# Patient Record
Sex: Female | Born: 1990
Health system: Southern US, Community
[De-identification: ages and names within clinical notes are randomized; demographics above are authoritative.]

## PROBLEM LIST (undated history)

## (undated) DIAGNOSIS — S83511A Sprain of anterior cruciate ligament of right knee, initial encounter: Secondary | ICD-10-CM

## (undated) DIAGNOSIS — Z8619 Personal history of other infectious and parasitic diseases: Secondary | ICD-10-CM

## (undated) DIAGNOSIS — R002 Palpitations: Secondary | ICD-10-CM

## (undated) HISTORY — PX: WISDOM TOOTH EXTRACTION: SHX21

## (undated) HISTORY — DX: Palpitations: R00.2

## (undated) HISTORY — DX: Personal history of other infectious and parasitic diseases: Z86.19

---
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
## 2012-09-04 ENCOUNTER — Encounter: Payer: Self-pay | Admitting: Family Medicine

## 2012-09-04 ENCOUNTER — Ambulatory Visit (INDEPENDENT_AMBULATORY_CARE_PROVIDER_SITE_OTHER): Payer: Federal, State, Local not specified - PPO | Admitting: Family Medicine

## 2012-09-04 VITALS — BP 102/62 | Temp 98.9°F | Wt 131.0 lb

## 2012-09-04 DIAGNOSIS — J310 Chronic rhinitis: Secondary | ICD-10-CM

## 2012-09-04 DIAGNOSIS — J329 Chronic sinusitis, unspecified: Secondary | ICD-10-CM

## 2012-09-04 MED ORDER — AZITHROMYCIN 250 MG PO TABS: ORAL_TABLET | ORAL | Status: DC

## 2012-09-04 NOTE — Progress Notes (Signed)
  Subjective:    Patient ID: Ariel Parker, female    DOB: 09/11/1990, 22 y.o.   MRN: [id number]  Sore Throat  This is a new problem. The current episode started in the past 7 days. The problem has been gradually worsening. Associated symptoms include headaches and swollen glands. Pertinent negatives include no coughing. She has had no exposure to strep. She has tried NSAIDs for the symptoms.    Patient also notes headache. Frontal in nature. Some congestion intermittently. Has been very busy so is feeling a bit tired.  Review of Systems  Respiratory: Negative for cough.   Neurological: Positive for headaches.       Objective:   Physical Exam  Alert vital signs stable. No acute distress. Lungs clear. Heart regular rate and rhythm. HEENT moderate nasal congestion frontal tenderness. Pharynx erythematous.      Assessment & Plan:  Impression #1 sinusitis with secondary pharyngitis-discussed. Plan symptomatic care discussed. Z-Pak use as directed. WSL

## 2012-09-04 NOTE — Patient Instructions (Addendum)
Inflammation in anterior glands is rarely mono, even less so in someone with prior hx. Will cover with z pk which will cover all the usual bacterial culprits.  Use three advil up to three times per day.

## 2012-09-08 ENCOUNTER — Telehealth: Payer: Self-pay | Admitting: Family Medicine

## 2012-09-08 MED ORDER — AZITHROMYCIN 250 MG PO TABS: ORAL_TABLET | ORAL | Status: DC

## 2012-09-08 NOTE — Telephone Encounter (Signed)
Patient notified. Sent in zpak to [facility] on [address] in [place], [place].

## 2012-09-08 NOTE — Telephone Encounter (Signed)
If it is just a day resume when she gets back, if several days then call in zpack

## 2012-09-08 NOTE — Telephone Encounter (Signed)
Pt states she forgot her zpak at home an went out of town, wants to know if she can just continue the dosage when she returns in a few days or do you need to call a new pack into the Pharmacy where she is now? [facility] [address].

## 2012-09-12 ENCOUNTER — Telehealth: Payer: Self-pay | Admitting: Family Medicine

## 2012-09-12 NOTE — Telephone Encounter (Signed)
Begins with OV to discuss Sx and examine. Testing usually ordered- tissuetransglutamase antibody.

## 2012-09-12 NOTE — Telephone Encounter (Signed)
Notified mom begins with OV to discuss Sx and examine. Testing usually ordered- tissuetransglutamase antibody. Mom transferred to front desk to schedule OV.

## 2012-09-12 NOTE — Telephone Encounter (Signed)
Patients mom is calling to see how to get her checked for celiac disease

## 2012-09-18 ENCOUNTER — Ambulatory Visit: Payer: Federal, State, Local not specified - PPO | Admitting: Family Medicine

## 2012-10-01 ENCOUNTER — Ambulatory Visit (INDEPENDENT_AMBULATORY_CARE_PROVIDER_SITE_OTHER): Payer: Federal, State, Local not specified - PPO | Admitting: Family Medicine

## 2012-10-01 ENCOUNTER — Encounter: Payer: Self-pay | Admitting: Family Medicine

## 2012-10-01 VITALS — BP 102/68 | Temp 98.7°F | Wt 127.0 lb

## 2012-10-01 DIAGNOSIS — R1013 Epigastric pain: Secondary | ICD-10-CM

## 2012-10-01 DIAGNOSIS — R634 Abnormal weight loss: Secondary | ICD-10-CM

## 2012-10-01 DIAGNOSIS — R5381 Other malaise: Secondary | ICD-10-CM

## 2012-10-01 DIAGNOSIS — R5383 Other fatigue: Secondary | ICD-10-CM

## 2012-10-01 NOTE — Progress Notes (Signed)
  Subjective:    Patient ID: Ariel Parker, female    DOB: 1990-11-04, 22 y.o.   MRN: [id number]  HPI Having abdominal pain after eating and oily stools. Having trouble since last November.  Long discussion held with patient regarding her symptoms. 30 minutes spent with family. No family history of bowel problems a supper irritable bowel. Patient does not smoke or drink. Patient relates severe pain and discomfort epigastric right upper quadrant after eating. Worse after milk related products. At times bowel movements are somewhat slimy-appearing but have a formed aspect to them. No blood in the bowel movements. No dysuria. Also seems to have significant abdominal pains and discomforts after her lactose and bread products patient has essentially become scared of eating to the point that she is losing weight. Denies night sweats or fevers. Has extreme nausea after eating.  Review of Systems Negative for chest pain shortness breath negative for swelling in the legs joint pains or rash. Negative for headaches congestion.    Objective:   Physical Exam Blood pressure good lungs are clear no crackle heart is regular abdomen is soft no guarding or rebound no masses are felt extremities no edema skin warm dry neurologic grossly normal       Assessment & Plan:  Abdominal pain-need to rule out various problems. We will go ahead with testing. Await the results. Ultrasound and lab work ordered. If these tests are negative next up would be gastroenterology in the next step after that would be EGD with biopsies of stomach and small intestine to rule out H. pylori and to rule out celiac disease. 30 minutes spent with patient.

## 2012-10-02 ENCOUNTER — Other Ambulatory Visit: Payer: Self-pay | Admitting: Family Medicine

## 2012-10-02 LAB — CBC WITH DIFFERENTIAL/PLATELET
Basophils Absolute: 0.1 10*3/uL (ref 0.0–0.1)
Eosinophils Absolute: 0.1 10*3/uL (ref 0.0–0.7)
Eosinophils Relative: 2 % (ref 0–5)
HCT: 36.1 % (ref 36.0–46.0)
MCH: 30.6 pg (ref 26.0–34.0)
MCHC: 34.6 g/dL (ref 30.0–36.0)
MCV: 88.3 fL (ref 78.0–100.0)
Monocytes Absolute: 0.4 10*3/uL (ref 0.1–1.0)
Platelets: 204 10*3/uL (ref 150–400)
RDW: 13 % (ref 11.5–15.5)

## 2012-10-03 LAB — BASIC METABOLIC PANEL
BUN: 24 mg/dL — ABNORMAL HIGH (ref 6–23)
CO2: 28 mEq/L (ref 19–32)
Chloride: 101 mEq/L (ref 96–112)
Glucose, Bld: 89 mg/dL (ref 70–99)
Potassium: 3.7 mEq/L (ref 3.5–5.3)
Sodium: 139 mEq/L (ref 135–145)

## 2012-10-03 LAB — EPSTEIN-BARR VIRUS VCA ANTIBODY PANEL
EBV EA IgG: <5 U/mL (ref <9.0)
EBV NA IgG: 319 U/mL — ABNORMAL HIGH (ref <18.0)

## 2012-10-03 LAB — LIPASE: Lipase: 34 U/L (ref 0–75)

## 2012-10-03 LAB — HEPATIC FUNCTION PANEL
AST: 21 U/L (ref 0–37)
Albumin: 4.6 g/dL (ref 3.5–5.2)
Alkaline Phosphatase: 43 U/L (ref 39–117)
Indirect Bilirubin: 0.3 mg/dL (ref 0.0–0.9)
Total Protein: 7.4 g/dL (ref 6.0–8.3)

## 2012-10-06 ENCOUNTER — Ambulatory Visit (HOSPITAL_COMMUNITY)
Admission: RE | Admit: 2012-10-06 | Discharge: 2012-10-06 | Disposition: A | Discharge status: Home / Self Care | Payer: Federal, State, Local not specified - PPO | Source: Ambulatory Visit | Attending: Family Medicine | Admitting: Family Medicine

## 2012-10-06 DIAGNOSIS — R1011 Right upper quadrant pain: Secondary | ICD-10-CM | POA: Insufficient documentation

## 2012-10-07 ENCOUNTER — Other Ambulatory Visit: Payer: Self-pay | Admitting: Family Medicine

## 2012-10-07 DIAGNOSIS — R109 Unspecified abdominal pain: Secondary | ICD-10-CM

## 2012-10-08 ENCOUNTER — Telehealth: Payer: Self-pay | Admitting: Family Medicine

## 2012-10-08 NOTE — Telephone Encounter (Signed)
Patient would like to see Dr. Karilyn Cota

## 2012-10-08 NOTE — Telephone Encounter (Signed)
Dr. Lorin Picket stated he put in order for referral. Pt wants to see Dr. Karilyn Cota

## 2012-10-08 NOTE — Progress Notes (Signed)
discusssed with patient 

## 2012-11-06 ENCOUNTER — Ambulatory Visit: Payer: Federal, State, Local not specified - PPO | Admitting: Adult Health

## 2013-04-16 ENCOUNTER — Ambulatory Visit (INDEPENDENT_AMBULATORY_CARE_PROVIDER_SITE_OTHER): Payer: Federal, State, Local not specified - PPO | Admitting: Nurse Practitioner

## 2013-04-16 ENCOUNTER — Encounter: Payer: Self-pay | Admitting: Nurse Practitioner

## 2013-04-16 VITALS — BP 118/70 | Temp 98.0°F | Ht 66.0 in | Wt 138.0 lb

## 2013-04-16 DIAGNOSIS — J069 Acute upper respiratory infection, unspecified: Secondary | ICD-10-CM

## 2013-04-16 MED ORDER — AZITHROMYCIN 250 MG PO TABS: ORAL_TABLET | ORAL | Status: DC

## 2013-04-16 MED ORDER — FLUTICASONE PROPIONATE 50 MCG/ACT NA SUSP: 2.0000 | Freq: Every day | NASAL | Status: DC

## 2013-04-20 ENCOUNTER — Encounter: Payer: Self-pay | Admitting: Nurse Practitioner

## 2013-04-20 NOTE — Progress Notes (Signed)
Subjective:  Presents for recheck after being diagnosed with acute tonsillitis 2 and half weeks ago at urgent care. Was given amoxicillin. Symptoms did improve but continues to have some symptoms. At this time, no fever. Continues to have a cough producing dark green sputum at times. Postnasal drainage. Slight wheezing only after prolonged coughing spells. Ear pain. Minimal sore throat. No headache. No vomiting diarrhea or abdominal pain. No acid reflux or heartburn.  Objective:   BP 118/70  Temp(Src) 98 F (36.7 C)  Ht 5\' 6"  (1.676 m)  Wt 138 lb (62.596 kg)  BMI 22.28 kg/m2 NAD. Alert, oriented. TMs clear effusion, no erythema. Nasal mucosa pale and moderately boggy. Pharynx right tonsil less than 1+, left tonsil 1-2+ with no erythema or exudate noted. Neck supple with mild soft anterior cervical adenopathy. Lungs clear. Heart regular rate rhythm. Abdomen soft nondistended nontender.  Assessment:Acute upper respiratory infections of unspecified site  Plan: Meds ordered this encounter  Medications  . azithromycin (ZITHROMAX Z-PAK) 250 MG tablet    Sig: Take 2 tablets (500 mg) on  Day 1,  followed by 1 tablet (250 mg) once daily on Days 2 through 5.    Dispense:  6 each    Refill:  0    Order Specific Question:  Supervising Provider    Answer:  Merlyn AlbertLUKING, WILLIAM S [[id number]]  . fluticasone (FLONASE) 50 MCG/ACT nasal spray    Sig: Place 2 sprays into both nostrils daily.    Dispense:  16 g    Refill:  5    Order Specific Question:  Supervising Provider    Answer:  Merlyn AlbertLUKING, WILLIAM S [[id number]]   OTC antihistamines as directed. Call back in 10-14 days if no improvement, sooner if worse.

## 2014-07-08 IMAGING — US US ABDOMEN COMPLETE
1 series · 13 of 25 positions shown · non-contrast
Comparison: None.

CLINICAL DATA: Right upper quadrant abdominal pain.

COMPLETE ABDOMINAL ULTRASOUND

[Series 1: us abdomen complete · 0.16mm/px · 13 of 104 slices shown]
[im 1/104]
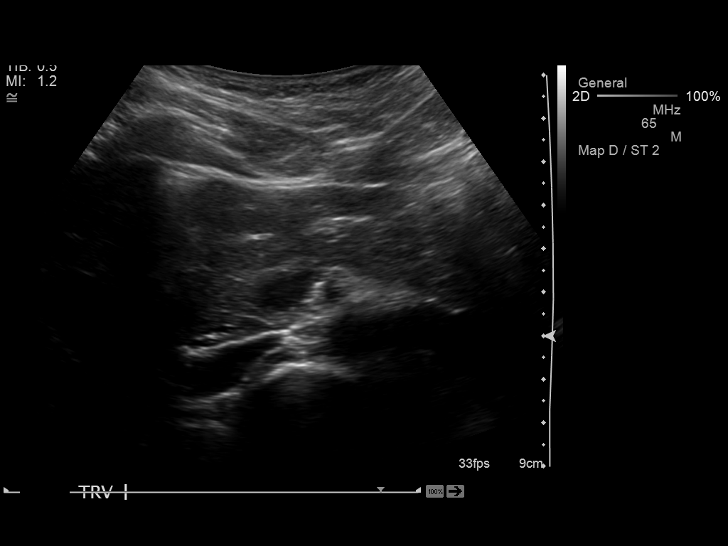
[im 9/104]
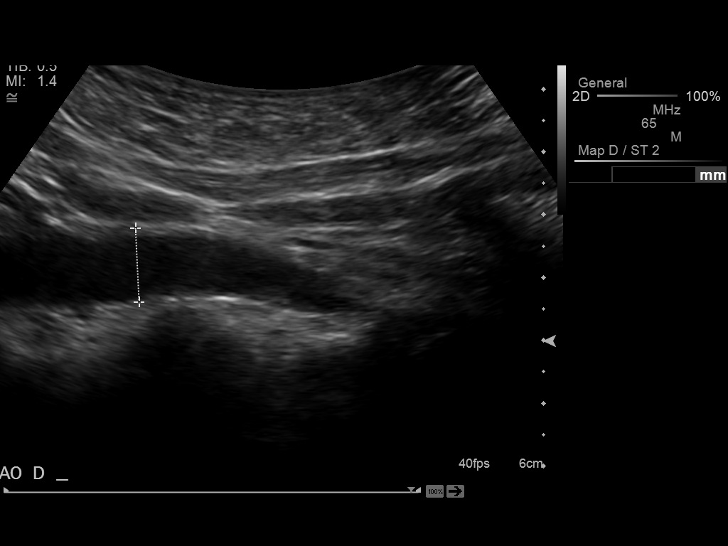
[im 18/104]
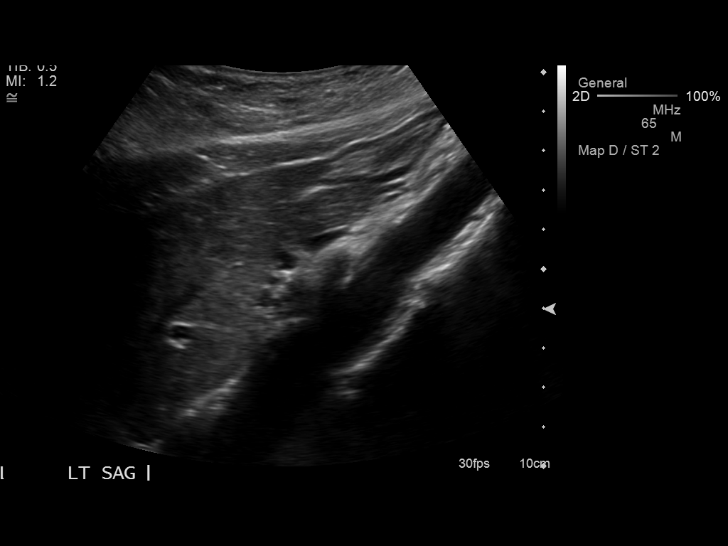
[im 26/104]
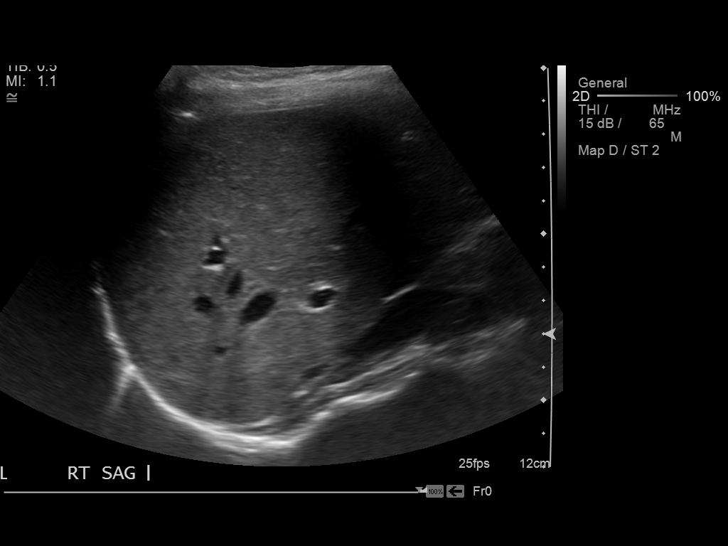
[im 35/104]
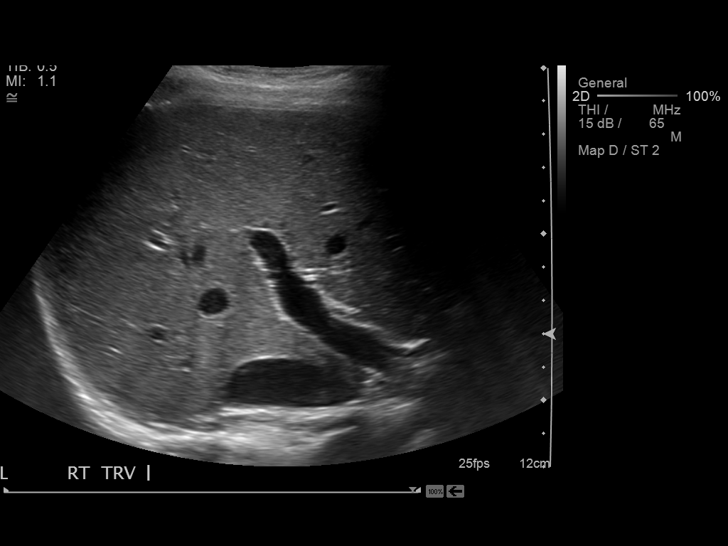
[im 43/104]
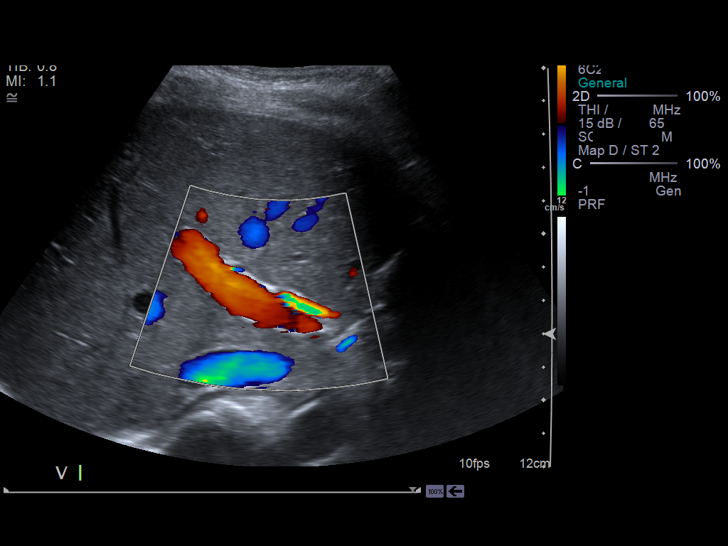
[im 52/104]
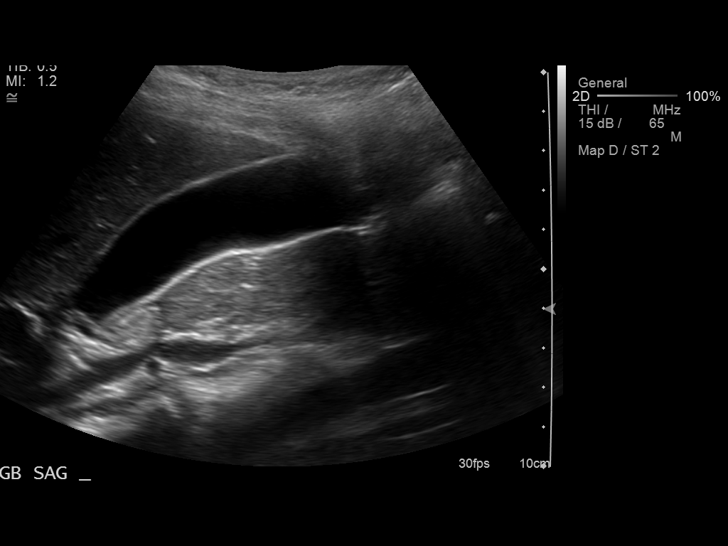
[im 61/104]
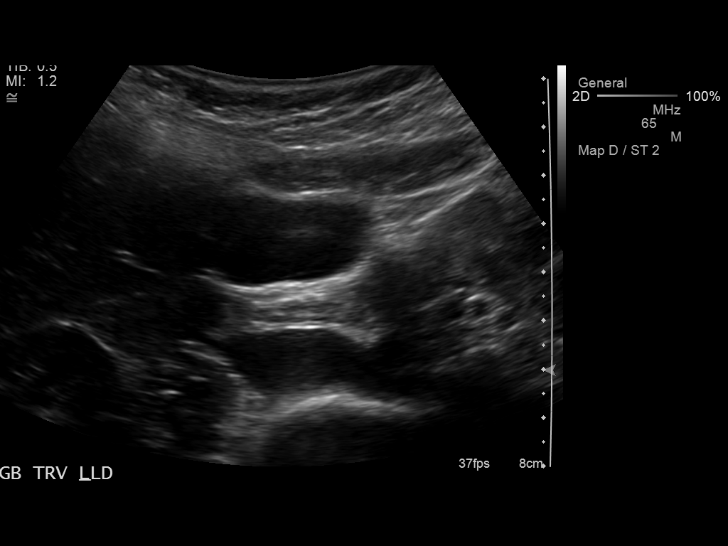
[im 69/104]
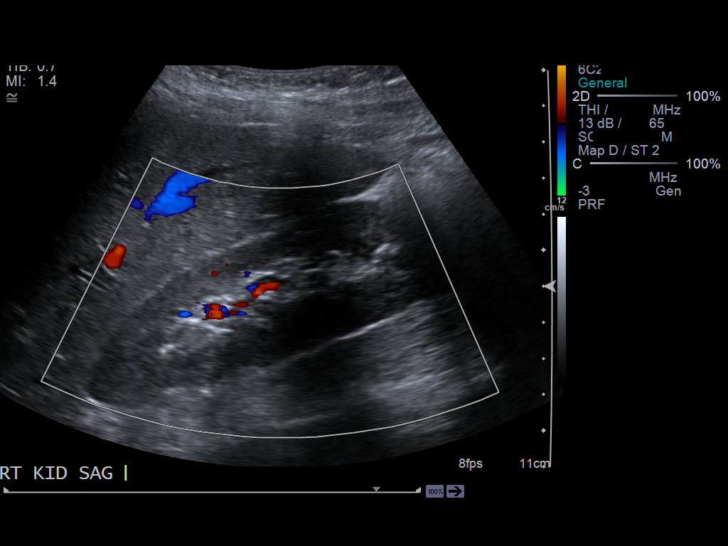
[im 78/104]
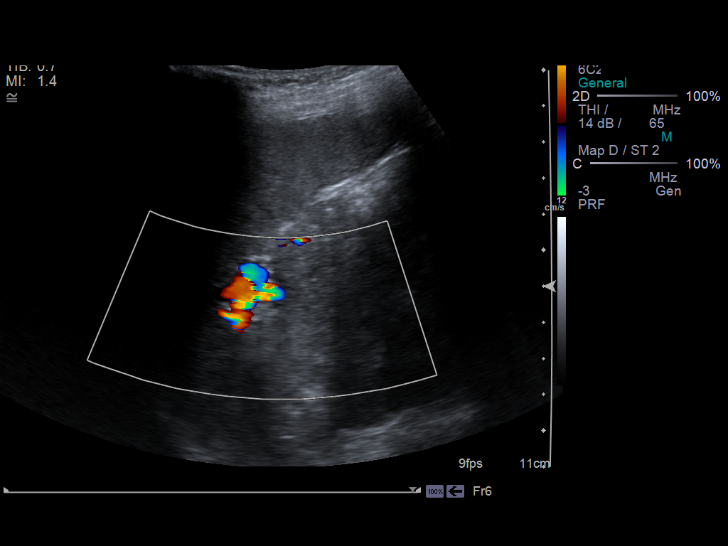
[im 86/104]
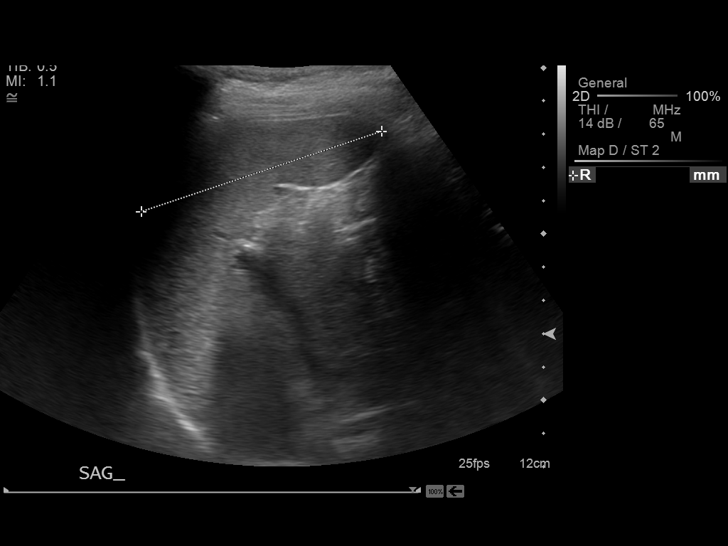
[im 95/104]
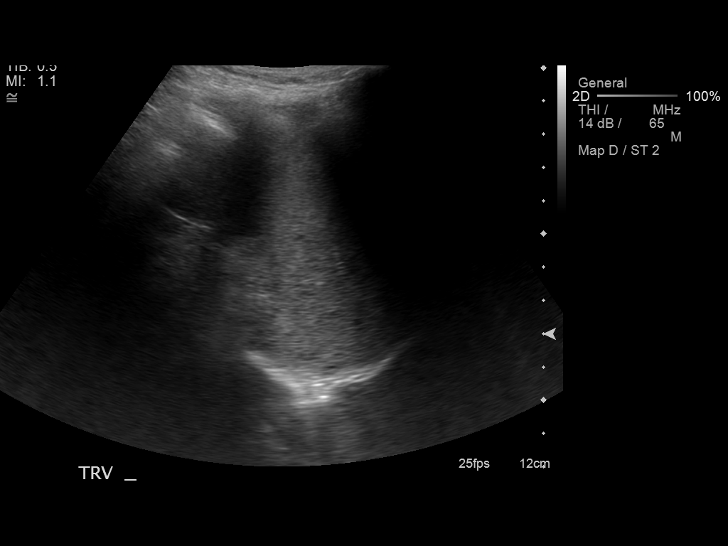
[im 104/104]
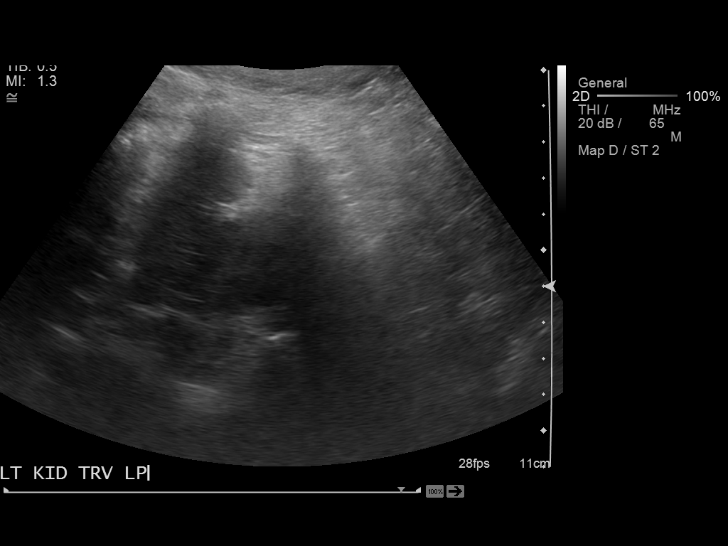

[13 of 25 positions shown; findings below may reference images not displayed]

FINDINGS: Gallbladder:  No shadowing gallstones or echogenic sludge.  No
gallbladder wall thickening or pericholecystic fluid.  No
sonographic Murphy's sign according to the ultrasound technologist.
Wall thickness is 1.4 mm, within normal limits.

Common bile duct:  Normal in caliber. No biliary ductal dilation.
The maximal diameter is 3.0 mm.

Liver:  No focal lesion identified.  Within normal limits in
parenchymal echogenicity.

IVC:  Appears normal.

Pancreas:  Although the pancreas is difficult to visualize in its
entirety, no focal pancreatic abnormality is identified.

Spleen:  Normal size and echotexture without focal parenchymal
abnormality.The maximal length is 7.6 cm, within normal limits.

Right Kidney:  No hydronephrosis.  Well-preserved cortex.  Normal
size and parenchymal echotexture without focal abnormalities. The
maximal length is 11.4 cm, within normal limits.

Left Kidney:  No hydronephrosis.  Well-preserved cortex.  Normal
size and parenchymal echotexture without focal abnormalities. The
maximal length is 11.6 cm, within normal limits.

Abdominal aorta:  No aneurysm identified.
IMPRESSION: Negative abdominal ultrasound.

## 2014-08-16 ENCOUNTER — Telehealth: Payer: Self-pay | Admitting: Family Medicine

## 2014-08-16 ENCOUNTER — Encounter: Payer: Self-pay | Admitting: Nurse Practitioner

## 2014-08-16 ENCOUNTER — Ambulatory Visit (INDEPENDENT_AMBULATORY_CARE_PROVIDER_SITE_OTHER): Payer: Federal, State, Local not specified - PPO | Admitting: Nurse Practitioner

## 2014-08-16 VITALS — BP 120/80 | Temp 98.9°F | Ht 66.0 in | Wt 141.0 lb

## 2014-08-16 DIAGNOSIS — B338 Other specified viral diseases: Secondary | ICD-10-CM | POA: Diagnosis not present

## 2014-08-16 DIAGNOSIS — B348 Other viral infections of unspecified site: Secondary | ICD-10-CM

## 2014-08-16 DIAGNOSIS — J069 Acute upper respiratory infection, unspecified: Secondary | ICD-10-CM

## 2014-08-16 DIAGNOSIS — H109 Unspecified conjunctivitis: Secondary | ICD-10-CM

## 2014-08-16 DIAGNOSIS — B9689 Other specified bacterial agents as the cause of diseases classified elsewhere: Secondary | ICD-10-CM

## 2014-08-16 MED ORDER — SULFACETAMIDE SODIUM 10 % OP SOLN: 1.0000 [drp] | OPHTHALMIC | Status: DC

## 2014-08-16 MED ORDER — AZITHROMYCIN 250 MG PO TABS: ORAL_TABLET | ORAL | Status: DC

## 2014-08-16 NOTE — Telephone Encounter (Signed)
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  She is in need of an MRI on her right knee and wants to know if we can order this for her?

## 2014-08-17 NOTE — Telephone Encounter (Signed)
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 This will help us decide what to do. Thanks.

## 2014-08-17 NOTE — Telephone Encounter (Signed)
LMRC

## 2014-08-19 ENCOUNTER — Encounter: Payer: Self-pay | Admitting: Nurse Practitioner

## 2014-08-19 NOTE — Progress Notes (Signed)
Subjective:  Presents complaints of fever sore throat and congestion that began 4 days ago. Low-grade fever less than 100. Headache. Cough producing clear mucus. No wheezing. Postnasal drainage when she lays down which causes difficulty breathing. Bloodshot eyes for the past few days with crusting in the morning. No itching or burning. No visual changes. No eye pain. No vomiting diarrhea or abdominal pain. Mild nausea. Taking fluids well. Voiding normal limit. Fatigue. Muscle aches.  Objective:   BP 120/80 mmHg  Temp(Src) 98.9 F (37.2 C) (Oral)  Ht 5\' 6"  (1.676 m)  Wt 141 lb (63.957 kg)  BMI 22.77 kg/m2 NAD. Alert, oriented. Fatigued in appearance. TMs clear effusion, no erythema. Conjunctiva mildly injected bilaterally with a preauricular lymph node noted on the left. Pharynx injected with PND noted. Neck supple with mild soft anterior adenopathy. Lungs clear. Heart regular rate rhythm.  Assessment: Parainfluenza  Bilateral conjunctivitis  Bacterial upper respiratory infection  Plan:  Meds ordered this encounter  Medications  . azithromycin (ZITHROMAX Z-PAK) 250 MG tablet    Sig: Take 2 tablets (500 mg) on  Day 1,  followed by 1 tablet (250 mg) once daily on Days 2 through 5.    Dispense:  6 each    Refill:  0    Order Specific Question:  Supervising Provider    Answer:  Merlyn AlbertLUKING, WILLIAM S [[id number]]  . sulfacetamide (BLEPH-10) 10 % ophthalmic solution    Sig: Place 1 drop into both eyes every 2 (two) hours while awake. Then QID starting tomorrow    Dispense:  10 mL    Refill:  0    Order Specific Question:  Supervising Provider    Answer:  Merlyn AlbertLUKING, WILLIAM S [[id number]]   OTC meds as directed for congestion and cough. Advised patient to conjunctivitis is most likely viral and will have to run its course, given Bleph 10 ophthalmic drops as a precaution. Call back by the end of the week if no improvement, sooner if worse.

## 2014-08-19 NOTE — Telephone Encounter (Signed)
LMRC

## 2014-08-20 NOTE — Telephone Encounter (Signed)
Left message to return call 

## 2014-08-20 NOTE — Telephone Encounter (Signed)
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

## 2014-08-21 NOTE — Telephone Encounter (Signed)
noted 

## 2014-08-25 ENCOUNTER — Ambulatory Visit (INDEPENDENT_AMBULATORY_CARE_PROVIDER_SITE_OTHER): Admitting: Family Medicine

## 2014-08-25 ENCOUNTER — Encounter: Payer: Self-pay | Admitting: Family Medicine

## 2014-08-25 VITALS — BP 108/64 | Ht 66.0 in | Wt 141.0 lb

## 2014-08-25 DIAGNOSIS — M25561 Pain in right knee: Secondary | ICD-10-CM

## 2014-08-25 NOTE — Progress Notes (Signed)
   Subjective:    Patient ID: Ariel Parker, female    DOB: 13-May-1990, 24 y.o.   MRN: [id number]  HPIRight knee injury about 4 weeks ago while playing soccer. This injury occurred during an active training exercise with the [employer].   [place] No xray was done. Still having pain and swelling. Not taking any meds for pain. Not able to bend it all the way and having trouble walking.   Motrin prn  Persistent pain and discomfort. Continues to experience a significant limp. Pain during daytime and nighttime.  States cannot completely extend leg.  History of ACL MCL injury left knee     Review of Systems No ankle pain no hip pain    Objective:   Physical Exam Alert vital stable lungs clear heart regular rhythm H&T normal right knee positive medial joint line tenderness positive effusion positive difficulty extension no obvious cruciate ligament laxity       Assessment & Plan:  Impression substantial right knee injury with ongoing locking question meniscal versus cartilage injury discussed plan MRI of knee. Rationale discussed WSL

## 2014-09-07 ENCOUNTER — Ambulatory Visit (HOSPITAL_COMMUNITY)
Admission: RE | Admit: 2014-09-07 | Discharge: 2014-09-07 | Disposition: A | Discharge status: Home / Self Care | Source: Ambulatory Visit | Attending: Family Medicine | Admitting: Family Medicine

## 2014-09-07 DIAGNOSIS — M25561 Pain in right knee: Secondary | ICD-10-CM | POA: Diagnosis present

## 2014-09-07 DIAGNOSIS — S83511A Sprain of anterior cruciate ligament of right knee, initial encounter: Secondary | ICD-10-CM | POA: Insufficient documentation

## 2014-09-07 DIAGNOSIS — X58XXXA Exposure to other specified factors, initial encounter: Secondary | ICD-10-CM | POA: Insufficient documentation

## 2014-09-24 DIAGNOSIS — S83511A Sprain of anterior cruciate ligament of right knee, initial encounter: Secondary | ICD-10-CM

## 2014-09-24 HISTORY — DX: Sprain of anterior cruciate ligament of right knee, initial encounter: S83.511A

## 2014-09-29 ENCOUNTER — Other Ambulatory Visit: Payer: Self-pay | Admitting: Physician Assistant

## 2014-10-05 ENCOUNTER — Other Ambulatory Visit: Payer: Self-pay | Admitting: Physician Assistant

## 2014-10-05 NOTE — H&P (Signed)
Ariel Parker is an old patient of mine I haven't seen in a number of years.  I did a left knee ACL reconstruction, hamstring autograft back in 2008.  Full recovery and rehab.  Back to full activity.  She has done well.  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  Obviously to get back to release and full duty this needs to be addressed first.  Typical injury, vertical load, twisting.  She felt and heard a pop.  Initial swelling improved.  Persistent instability since.  Subsequent evaluation and workup, including MRI scan, complete.  Complete anterior cruciate ligament tear with typical bone bruises.  Other ligaments and menisci are intact.  No degenerative changes.  She comes in to discuss definitive treatment.  She is obviously well aware of what is involved, as I did her left knee back in 2008.  She is seen today with her mom.  She does want to get back to full duty and pursue a career in the [employer].   History and general exam is outlined and included in the chart.   EXAMINATION: Specifically, both knees have full motion.  Swelling definitely better on the right.  On the left she has a good end point with Lachman and drawer.  Ligaments stable.  No meniscal signs.  On the right she has a positive Lachman, drawer and pivot shift.  A little sore laterally, but I am not getting a true McMurray's.  Neurovascularly intact distally.    X-RAYS: Four view standing x-ray shows no acute fractures.  No degenerative changes in either knee.  She does have a post screw in the proximal tibia on the left.  There are no symptoms from this.    DISPOSITION:  Right knee anterior cruciate ligament tear, anterolateral rotary instability.  We need to pursue reconstruction based on age, activity and vocational pursuits.  She understands and agrees.  Exam under anesthesia, arthroscopy, arthroscopic endoscopic reconstruction  utilizing hamstring autograft.  Procedure, risks, benefits and complications reviewed.  Paperwork complete.  All questions answered.  Depending on findings and how she does with rehab, she is going to be six months to a year before she is released without any restrictions.  I can obviously release her earlier for limited activities once she is ambulatory and mobile.  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  I will see her at the time of operative intervention.    Loreta Aveaniel F. Murphy, M.D.

## 2014-10-08 ENCOUNTER — Encounter (HOSPITAL_BASED_OUTPATIENT_CLINIC_OR_DEPARTMENT_OTHER): Payer: Self-pay | Admitting: *Deleted

## 2014-10-14 ENCOUNTER — Ambulatory Visit (HOSPITAL_BASED_OUTPATIENT_CLINIC_OR_DEPARTMENT_OTHER)
Admission: RE | Admit: 2014-10-14 | Discharge: 2014-10-14 | Disposition: A | Discharge status: Home / Self Care | Source: Ambulatory Visit | Attending: Orthopedic Surgery | Admitting: Orthopedic Surgery

## 2014-10-14 ENCOUNTER — Encounter (HOSPITAL_BASED_OUTPATIENT_CLINIC_OR_DEPARTMENT_OTHER): Payer: Self-pay | Admitting: Anesthesiology

## 2014-10-14 ENCOUNTER — Ambulatory Visit (HOSPITAL_BASED_OUTPATIENT_CLINIC_OR_DEPARTMENT_OTHER): Admitting: Anesthesiology

## 2014-10-14 ENCOUNTER — Encounter (HOSPITAL_BASED_OUTPATIENT_CLINIC_OR_DEPARTMENT_OTHER)
Admission: RE | Disposition: A | Discharge status: Home / Self Care | Payer: Self-pay | Source: Ambulatory Visit | Attending: Orthopedic Surgery

## 2014-10-14 DIAGNOSIS — S83511A Sprain of anterior cruciate ligament of right knee, initial encounter: Secondary | ICD-10-CM | POA: Insufficient documentation

## 2014-10-14 DIAGNOSIS — M25361 Other instability, right knee: Secondary | ICD-10-CM | POA: Diagnosis not present

## 2014-10-14 DIAGNOSIS — Y9366 Activity, soccer: Secondary | ICD-10-CM | POA: Diagnosis not present

## 2014-10-14 DIAGNOSIS — X58XXXA Exposure to other specified factors, initial encounter: Secondary | ICD-10-CM | POA: Diagnosis not present

## 2014-10-14 DIAGNOSIS — Z888 Allergy status to other drugs, medicaments and biological substances status: Secondary | ICD-10-CM | POA: Insufficient documentation

## 2014-10-14 HISTORY — PX: KNEE ARTHROSCOPY WITH LATERAL MENISECTOMY: SHX6193

## 2014-10-14 HISTORY — PX: KNEE ARTHROSCOPY WITH ANTERIOR CRUCIATE LIGAMENT (ACL) REPAIR WITH HAMSTRING GRAFT: SHX5645

## 2014-10-14 HISTORY — DX: Sprain of anterior cruciate ligament of right knee, initial encounter: S83.511A

## 2014-10-14 SURGERY — KNEE ARTHROSCOPY WITH ANTERIOR CRUCIATE LIGAMENT (ACL) REPAIR WITH HAMSTRING GRAFT
Anesthesia: General | Site: Knee | Laterality: Right

## 2014-10-14 MED ORDER — FENTANYL CITRATE (PF) 100 MCG/2ML IJ SOLN
INTRAMUSCULAR | Status: DC | PRN
  Administered 2014-10-14 (×2): 50 ug via INTRAVENOUS

## 2014-10-14 MED ORDER — LACTATED RINGERS IV SOLN: INTRAVENOUS | Status: DC

## 2014-10-14 MED ORDER — FENTANYL CITRATE (PF) 100 MCG/2ML IJ SOLN
INTRAMUSCULAR | Status: AC
  Filled 2014-10-14: qty 2

## 2014-10-14 MED ORDER — CEFAZOLIN SODIUM-DEXTROSE 2-3 GM-% IV SOLR
2.0000 g | INTRAVENOUS | Status: AC
  Administered 2014-10-14: 2 g via INTRAVENOUS

## 2014-10-14 MED ORDER — KETOROLAC TROMETHAMINE 30 MG/ML IJ SOLN
INTRAMUSCULAR | Status: DC | PRN
  Administered 2014-10-14: 30 mg via INTRAVENOUS

## 2014-10-14 MED ORDER — MIDAZOLAM HCL 2 MG/2ML IJ SOLN
INTRAMUSCULAR | Status: AC
  Filled 2014-10-14: qty 2

## 2014-10-14 MED ORDER — ROPIVACAINE HCL 5 MG/ML IJ SOLN
INTRAMUSCULAR | Status: DC | PRN
  Administered 2014-10-14: 20 mL via PERINEURAL

## 2014-10-14 MED ORDER — DEXAMETHASONE SODIUM PHOSPHATE 4 MG/ML IJ SOLN
INTRAMUSCULAR | Status: DC | PRN
  Administered 2014-10-14: 10 mg via INTRAVENOUS

## 2014-10-14 MED ORDER — PROPOFOL 10 MG/ML IV BOLUS
INTRAVENOUS | Status: DC | PRN
  Administered 2014-10-14: 30 mg via INTRAVENOUS
  Administered 2014-10-14: 200 mg via INTRAVENOUS

## 2014-10-14 MED ORDER — ONDANSETRON HCL 4 MG PO TABS: 4.0000 mg | ORAL_TABLET | Freq: Three times a day (TID) | ORAL | Status: DC | PRN

## 2014-10-14 MED ORDER — SCOPOLAMINE 1 MG/3DAYS TD PT72
MEDICATED_PATCH | TRANSDERMAL | Status: AC
  Filled 2014-10-14: qty 1

## 2014-10-14 MED ORDER — LIDOCAINE HCL (CARDIAC) 20 MG/ML IV SOLN
INTRAVENOUS | Status: DC | PRN
  Administered 2014-10-14: 50 mg via INTRAVENOUS

## 2014-10-14 MED ORDER — GLYCOPYRROLATE 0.2 MG/ML IJ SOLN: 0.2000 mg | Freq: Once | INTRAMUSCULAR | Status: DC | PRN

## 2014-10-14 MED ORDER — MIDAZOLAM HCL 2 MG/2ML IJ SOLN
1.0000 mg | INTRAMUSCULAR | Status: DC | PRN
  Administered 2014-10-14: 1 mg via INTRAVENOUS

## 2014-10-14 MED ORDER — LIDOCAINE-EPINEPHRINE (PF) 1.5 %-1:200000 IJ SOLN
INTRAMUSCULAR | Status: DC | PRN
  Administered 2014-10-14: 10 mL via PERINEURAL

## 2014-10-14 MED ORDER — KETOROLAC TROMETHAMINE 30 MG/ML IJ SOLN: 30.0000 mg | Freq: Once | INTRAMUSCULAR | Status: DC | PRN

## 2014-10-14 MED ORDER — LACTATED RINGERS IV SOLN
INTRAVENOUS | Status: DC
  Administered 2014-10-14 (×2): via INTRAVENOUS

## 2014-10-14 MED ORDER — MIDAZOLAM HCL 5 MG/5ML IJ SOLN
INTRAMUSCULAR | Status: DC | PRN
Start: 2014-10-14 — End: 2014-10-14
  Administered 2014-10-14: 2 mg via INTRAVENOUS

## 2014-10-14 MED ORDER — SCOPOLAMINE 1 MG/3DAYS TD PT72
1.0000 | MEDICATED_PATCH | Freq: Once | TRANSDERMAL | Status: DC | PRN
  Administered 2014-10-14: 1.5 mg via TRANSDERMAL

## 2014-10-14 MED ORDER — PROMETHAZINE HCL 25 MG/ML IJ SOLN
6.2500 mg | INTRAMUSCULAR | Status: DC | PRN
Start: 2014-10-14 — End: 2014-10-14

## 2014-10-14 MED ORDER — FENTANYL CITRATE (PF) 100 MCG/2ML IJ SOLN
INTRAMUSCULAR | Status: AC
  Filled 2014-10-14: qty 6

## 2014-10-14 MED ORDER — HYDROMORPHONE HCL 1 MG/ML IJ SOLN
0.2500 mg | INTRAMUSCULAR | Status: DC | PRN
  Administered 2014-10-14 (×2): 0.5 mg via INTRAVENOUS

## 2014-10-14 MED ORDER — HYDROMORPHONE HCL 1 MG/ML IJ SOLN
INTRAMUSCULAR | Status: AC
  Filled 2014-10-14: qty 1

## 2014-10-14 MED ORDER — ONDANSETRON 8 MG PO TBDP
8.0000 mg | ORAL_TABLET | Freq: Once | ORAL | Status: AC
Start: 2014-10-14 — End: 2014-10-14
  Administered 2014-10-14: 8 mg via ORAL

## 2014-10-14 MED ORDER — FENTANYL CITRATE (PF) 100 MCG/2ML IJ SOLN
50.0000 ug | INTRAMUSCULAR | Status: DC | PRN
  Administered 2014-10-14: 50 ug via INTRAVENOUS

## 2014-10-14 MED ORDER — HYDROCODONE-ACETAMINOPHEN 10-325 MG PO TABS: 1.0000 | ORAL_TABLET | ORAL | Status: DC | PRN

## 2014-10-14 MED ORDER — CHLORHEXIDINE GLUCONATE 4 % EX LIQD: 60.0000 mL | Freq: Once | CUTANEOUS | Status: DC

## 2014-10-14 MED ORDER — ONDANSETRON 8 MG PO TBDP
ORAL_TABLET | ORAL | Status: AC
  Filled 2014-10-14: qty 1

## 2014-10-14 SURGICAL SUPPLY — 78 items
ANCHOR BUTTON TIGHTROPE ACL RT (Orthopedic Implant) ×2 IMPLANT
ANCHOR BUTTON TIGHTROPE RN 14 (Anchor) ×2 IMPLANT
ANCHOR PUSHLOCK PEEK 3.5X19.5 (Anchor) ×2 IMPLANT
APL SKNCLS STERI-STRIP NONHPOA (GAUZE/BANDAGES/DRESSINGS) ×1
BANDAGE ELASTIC 6 VELCRO ST LF (GAUZE/BANDAGES/DRESSINGS) ×2 IMPLANT
BANDAGE ESMARK 6X9 LF (GAUZE/BANDAGES/DRESSINGS) ×1 IMPLANT
BENZOIN TINCTURE PRP APPL 2/3 (GAUZE/BANDAGES/DRESSINGS) ×2 IMPLANT
BLADE 4.2CUDA (BLADE) IMPLANT
BLADE CUDA 5.5 (BLADE) IMPLANT
BLADE CUDA GRT WHITE 3.5 (BLADE) IMPLANT
BLADE CUTTER GATOR 3.5 (BLADE) ×2 IMPLANT
BLADE CUTTER MENIS 5.5 (BLADE) IMPLANT
BLADE GREAT WHITE 4.2 (BLADE) ×2 IMPLANT
BLADE SURG 15 STRL LF DISP TIS (BLADE) ×1 IMPLANT
BLADE SURG 15 STRL SS (BLADE) ×1
BNDG CMPR 9X6 STRL LF SNTH (GAUZE/BANDAGES/DRESSINGS) ×1
BNDG ESMARK 6X9 LF (GAUZE/BANDAGES/DRESSINGS) ×2
BUR OVAL 6.0 (BURR) ×2 IMPLANT
COVER BACK TABLE 60X90IN (DRAPES) ×2 IMPLANT
CUFF TOURNIQUET SINGLE 34IN LL (TOURNIQUET CUFF) ×2 IMPLANT
CUTTER MENISCUS  4.2MM (BLADE)
CUTTER MENISCUS 4.2MM (BLADE) IMPLANT
DECANTER SPIKE VIAL GLASS SM (MISCELLANEOUS) IMPLANT
DRAPE ARTHROSCOPY W/POUCH 114 (DRAPES) ×2 IMPLANT
DRAPE OEC MINIVIEW 54X84 (DRAPES) ×2 IMPLANT
DRAPE U-SHAPE 47X51 STRL (DRAPES) ×2 IMPLANT
DURAPREP 26ML APPLICATOR (WOUND CARE) ×2 IMPLANT
ELECT MENISCUS 165MM 90D (ELECTRODE) ×2 IMPLANT
ELECT REM PT RETURN 9FT ADLT (ELECTROSURGICAL) ×2
ELECTRODE REM PT RTRN 9FT ADLT (ELECTROSURGICAL) ×1 IMPLANT
GAUZE SPONGE 4X4 12PLY STRL (GAUZE/BANDAGES/DRESSINGS) ×4 IMPLANT
GAUZE XEROFORM 1X8 LF (GAUZE/BANDAGES/DRESSINGS) ×2 IMPLANT
GLOVE BIO SURGEON STRL SZ 6.5 (GLOVE) ×4 IMPLANT
GLOVE BIOGEL PI IND STRL 7.0 (GLOVE) ×2 IMPLANT
GLOVE BIOGEL PI INDICATOR 7.0 (GLOVE) ×2
GLOVE ECLIPSE 7.0 STRL STRAW (GLOVE) ×2 IMPLANT
GLOVE SURG ORTHO 8.0 STRL STRW (GLOVE) ×2 IMPLANT
GOWN STRL REUS W/ TWL LRG LVL3 (GOWN DISPOSABLE) ×2 IMPLANT
GOWN STRL REUS W/ TWL XL LVL3 (GOWN DISPOSABLE) ×1 IMPLANT
GOWN STRL REUS W/TWL LRG LVL3 (GOWN DISPOSABLE) ×2
GOWN STRL REUS W/TWL XL LVL3 (GOWN DISPOSABLE) ×1
IMMOBILIZER KNEE 22 UNIV (SOFTGOODS) ×2 IMPLANT
IMMOBILIZER KNEE 24 THIGH 36 (MISCELLANEOUS) IMPLANT
IMMOBILIZER KNEE 24 UNIV (MISCELLANEOUS)
IV NS IRRIG 3000ML ARTHROMATIC (IV SOLUTION) ×8 IMPLANT
KNEE WRAP E Z 3 GEL PACK (MISCELLANEOUS) ×2 IMPLANT
MANIFOLD NEPTUNE II (INSTRUMENTS) ×2 IMPLANT
NS IRRIG 1000ML POUR BTL (IV SOLUTION) ×2 IMPLANT
PACK ARTHROSCOPY DSU (CUSTOM PROCEDURE TRAY) ×2 IMPLANT
PACK BASIN DAY SURGERY FS (CUSTOM PROCEDURE TRAY) ×2 IMPLANT
PASSER SUT SWANSON 36MM LOOP (INSTRUMENTS) ×2 IMPLANT
PENCIL BUTTON HOLSTER BLD 10FT (ELECTRODE) ×2 IMPLANT
PIN DRILL ACL TIGHTROPE 4MM (PIN) ×2 IMPLANT
PK GRAFTLINK AUTO IMPLANT SYST (Anchor) ×2 IMPLANT
SET ARTHROSCOPY TUBING (MISCELLANEOUS) ×2
SET ARTHROSCOPY TUBING LN (MISCELLANEOUS) ×1 IMPLANT
SLEEVE SCD COMPRESS KNEE MED (MISCELLANEOUS) IMPLANT
SPONGE LAP 4X18 X RAY DECT (DISPOSABLE) ×2 IMPLANT
STRIP CLOSURE SKIN 1/2X4 (GAUZE/BANDAGES/DRESSINGS) ×2 IMPLANT
SUCTION FRAZIER TIP 10 FR DISP (SUCTIONS) IMPLANT
SUT 2 FIBERLOOP 20 STRT BLUE (SUTURE) ×2
SUT ETHILON 3 0 PS 1 (SUTURE) ×2 IMPLANT
SUT FIBERWIRE #2 38 T-5 BLUE (SUTURE) ×4
SUT MNCRL AB 3-0 PS2 18 (SUTURE) IMPLANT
SUT VIC AB 1 CT1 27 (SUTURE) ×4
SUT VIC AB 1 CT1 27XBRD ANBCTR (SUTURE) ×2 IMPLANT
SUT VIC AB 2-0 SH 27 (SUTURE) ×2
SUT VIC AB 2-0 SH 27XBRD (SUTURE) ×1 IMPLANT
SUT VIC AB 3-0 SH 27 (SUTURE)
SUT VIC AB 3-0 SH 27X BRD (SUTURE) IMPLANT
SUT VICRYL 4-0 PS2 18IN ABS (SUTURE) IMPLANT
SUTURE 2 FIBERLOOP 20 STRT BLU (SUTURE) ×1 IMPLANT
SUTURE FIBERWR #2 38 T-5 BLUE (SUTURE) ×2 IMPLANT
SYSTEM GRAFT IMPLANT AUTOGRAFT (Anchor) ×1 IMPLANT
TOWEL OR 17X24 6PK STRL BLUE (TOWEL DISPOSABLE) ×4 IMPLANT
TOWEL OR NON WOVEN STRL DISP B (DISPOSABLE) ×2 IMPLANT
WATER STERILE IRR 1000ML POUR (IV SOLUTION) ×2 IMPLANT
YANKAUER SUCT BULB TIP NO VENT (SUCTIONS) IMPLANT

## 2014-10-14 NOTE — Anesthesia Postprocedure Evaluation (Signed)
  Anesthesia Post-op Note  Patient: Archivist  Procedure(s) Performed: Procedure(s) (LRB): RIGHT KNEE ARTHROSCOPY WITH ANTERIOR CRUCIATE LIGAMENT (ACL) REPAIR (Right) KNEE ARTHROSCOPY WITH LATERAL MENISECTOMY (Right)  Patient Location: PACU  Anesthesia Type: GA combined with regional for post-op pain  Level of Consciousness: awake and alert   Airway and Oxygen Therapy: Patient Spontanous Breathing  Post-op Pain: mild  Post-op Assessment: Post-op Vital signs reviewed, Patient's Cardiovascular Status Stable, Respiratory Function Stable, Patent Airway and No signs of Nausea or vomiting  Last Vitals:  Filed Vitals:   10/14/14 1430  BP: 123/76  Pulse: 69  Temp:   Resp: 16    Post-op Vital Signs: stable   Complications: No apparent anesthesia complications

## 2014-10-14 NOTE — H&P (View-Only) (Signed)
Ariel Parker is an old patient of mine I haven't seen in a number of years.  I did a left knee ACL reconstruction, hamstring autograft back in 2008.  Full recovery and rehab.  Back to full activity.  She has done well.  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  Obviously to get back to release and full duty this needs to be addressed first.  Typical injury, vertical load, twisting.  She felt and heard a pop.  Initial swelling improved.  Persistent instability since.  Subsequent evaluation and workup, including MRI scan, complete.  Complete anterior cruciate ligament tear with typical bone bruises.  Other ligaments and menisci are intact.  No degenerative changes.  She comes in to discuss definitive treatment.  She is obviously well aware of what is involved, as I did her left knee back in 2008.  She is seen today with her mom.  She does want to get back to full duty and pursue a career in the military.   History and general exam is outlined and included in the chart.   EXAMINATION: Specifically, both knees have full motion.  Swelling definitely better on the right.  On the left she has a good end point with Lachman and drawer.  Ligaments stable.  No meniscal signs.  On the right she has a positive Lachman, drawer and pivot shift.  A little sore laterally, but I am not getting a true McMurray's.  Neurovascularly intact distally.    X-RAYS: Four view standing x-ray shows no acute fractures.  No degenerative changes in either knee.  She does have a post screw in the proximal tibia on the left.  There are no symptoms from this.    DISPOSITION:  Right knee anterior cruciate ligament tear, anterolateral rotary instability.  We need to pursue reconstruction based on age, activity and vocational pursuits.  She understands and agrees.  Exam under anesthesia, arthroscopy, arthroscopic endoscopic reconstruction  utilizing hamstring autograft.  Procedure, risks, benefits and complications reviewed.  Paperwork complete.  All questions answered.  Depending on findings and how she does with rehab, she is going to be six months to a year before she is released without any restrictions.  I can obviously release her earlier for limited activities once she is ambulatory and mobile.  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  I will see her at the time of operative intervention.    Daniel F. Murphy, M.D.  

## 2014-10-14 NOTE — Discharge Instructions (Signed)
[facility] [address]   [address] [place], [place] [address] [phone] A Division of [facility] Loreta Ave, M.D.     Robert A. Thurston Hole, M.D.     Lunette Stands, M.D. Eulas Post, M.D.    Buford Dresser, M.D. Estell Harpin, M.D. Ralene Cork, D.O.          Avis Epley, PA-C            Kirstin A. Shepperson, PA-C Janace Litten, OPA-C  ARTHROSCOPIC SURGERY POSTOPERATIVE INSTRUCTIONS - KNEE/ACL  You have just had and arthroscopic operation. Even though your incisions (puncture sites) are small and should heal quickly the structures inside your knee may take 6-8 weeks to heal and settle down.  This healing time is variable for each patient and will depend on what was done inside the knee at the time of surgery. Non-weight bearing for the first week.  Must be in knee immobilizer at all times unless in cpm.  Follow up appointment in one week.  PAIN MEDICATION You will be given a prescription for pain medication. Please take this medication as written. Most patients will require medication only for a few days.  SWELLING You can expect some swelling in your knee, this is normal. Applying EZY-GEL Wrap (cold packs) and elevating your leg will help keep the swelling to a minimum. Your entire leg should be elevated, not just your knee. Elevate your leg to or above the level of your waist.  The cold packs should be used constantly during the first 48 hours along with continued elevation of your leg.  After that ice for at least 1 hour, 4 times per day.  DRESSING Fluid leakage is common the first few days.  Precautions to prevent staining of your clothes, bed sheets, etc. should be taken.  The fluid was used to inflate the joint during surgery and is commonly tinged red from the small amount of blood. Some bleeding or leakage from the puncture sites may occur for a few days. Do not change your dressing. Do not  remove the Steri-Strips. Do not shower or get the wound wet.  SYMPTOMS TO REPORT TO YOUR DOCTOR Extreme pain. Extreme swelling. Temperature above 101 degrees. Change in the feeling, color or movement in your toes. Redness, heat or swelling at your puncture sites.  OFFICE CHECK-UP If no major problems arise and you are progressing well we will need to see you in one week. Please call the office to make an appointment. We will remove your sutures and discuss our surgery and rehabilitation at that time.  Regional Anesthesia Blocks  1. Numbness or the inability to move the "blocked" extremity may last from 3-48 hours after placement. The length of time depends on the medication injected and your individual response to the medication. If the numbness is not going away after 48 hours, call your surgeon.  2. The extremity that is blocked will need to be protected until the numbness is gone and the  Strength has returned. Because you cannot feel it, you will need to take extra care to avoid injury. Because it may be weak, you may have difficulty moving it or using it. You may not know what position it is in without looking at it while the block is in effect.  3. For blocks in the legs and feet, returning to weight bearing and walking needs to be done carefully. You will need to wait until the numbness  is entirely gone and the strength has returned. You should be able to move your leg and foot normally before you try and bear weight or walk. You will need someone to be with you when you first try to ensure you do not fall and possibly risk injury.  4. Bruising and tenderness at the needle site are common side effects and will resolve in a few days.  5. Persistent numbness or new problems with movement should be communicated to the surgeon or the [facility] [phone] [facility] [phone]).  Post Anesthesia Home Care Instructions  Activity: Get plenty of rest for the  remainder of the day. A responsible adult should stay with you for 24 hours following the procedure.  For the next 24 hours, DO NOT: -Drive a car -Advertising copywriter -Drink alcoholic beverages -Take any medication unless instructed by your physician -Make any legal decisions or sign important papers.  Meals: Start with liquid foods such as gelatin or soup. Progress to regular foods as tolerated. Avoid greasy, spicy, heavy foods. If nausea and/or vomiting occur, drink only clear liquids until the nausea and/or vomiting subsides. Call your physician if vomiting continues.  Special Instructions/Symptoms: Your throat may feel dry or sore from the anesthesia or the breathing tube placed in your throat during surgery. If this causes discomfort, gargle with warm salt water. The discomfort should disappear within 24 hours.  If you had a scopolamine patch placed behind your ear for the management of post- operative nausea and/or vomiting:  1. The medication in the patch is effective for 72 hours, after which it should be removed.  Wrap patch in a tissue and discard in the trash. Wash hands thoroughly with soap and water. 2. You may remove the patch earlier than 72 hours if you experience unpleasant side effects which may include dry mouth, dizziness or visual disturbances. 3. Avoid touching the patch. Wash your hands with soap and water after contact with the patch.

## 2014-10-14 NOTE — Anesthesia Preprocedure Evaluation (Signed)
Anesthesia Evaluation  Patient identified by MRN, date of birth, ID band Patient awake    Reviewed: Allergy & Precautions, NPO status , Patient's Chart, lab work & pertinent test results  Airway Mallampati: II  TM Distance: >3 FB Neck ROM: Full    Dental no notable dental hx.    Pulmonary neg pulmonary ROS,  breath sounds clear to auscultation  Pulmonary exam normal       Cardiovascular negative cardio ROS Normal cardiovascular examRhythm:Regular Rate:Normal     Neuro/Psych negative neurological ROS  negative psych ROS   GI/Hepatic negative GI ROS, Neg liver ROS,   Endo/Other  negative endocrine ROS  Renal/GU negative Renal ROS  negative genitourinary   Musculoskeletal negative musculoskeletal ROS (+)   Abdominal   Peds negative pediatric ROS (+)  Hematology negative hematology ROS (+)   Anesthesia Other Findings   Reproductive/Obstetrics negative OB ROS                             Anesthesia Physical Anesthesia Plan  ASA: I  Anesthesia Plan: General   Post-op Pain Management: GA combined w/ Regional for post-op pain   Induction: Intravenous  Airway Management Planned: LMA  Additional Equipment:   Intra-op Plan:   Post-operative Plan: Extubation in OR  Informed Consent: I have reviewed the patients History and Physical, chart, labs and discussed the procedure including the risks, benefits and alternatives for the proposed anesthesia with the patient or authorized representative who has indicated his/her understanding and acceptance.   Dental advisory given  Plan Discussed with: CRNA and Surgeon  Anesthesia Plan Comments:         Anesthesia Quick Evaluation

## 2014-10-14 NOTE — Anesthesia Procedure Notes (Addendum)
Anesthesia Regional Block:  Femoral nerve block  Pre-Anesthetic Checklist: ,, timeout performed, Correct Patient, Correct Site, Correct Laterality, Correct Procedure, Correct Position, site marked, Risks and benefits discussed,  Surgical consent,  Pre-op evaluation,  At surgeon's request and post-op pain management  Laterality: Right  Prep: chloraprep       Needles:  Injection technique: Single-shot  Needle Type: Echogenic Stimulator Needle     Needle Length: 9cm 9 cm Needle Gauge: 21 and 21 G    Additional Needles:  Procedures: ultrasound guided (picture in chart) Femoral nerve block Narrative:  Injection made incrementally with aspirations every 5 mL.  Performed by: Personally   Additional Notes: Patient tolerated the procedure well without complications   Procedure Name: LMA Insertion Date/Time: 10/14/2014 11:55 AM Performed by: Genevieve Norlander L Pre-anesthesia Checklist: Patient identified, Emergency Drugs available, Suction available, Patient being monitored and Timeout performed Patient Re-evaluated:Patient Re-evaluated prior to inductionOxygen Delivery Method: Circle System Utilized Preoxygenation: Pre-oxygenation with 100% oxygen Intubation Type: IV induction Ventilation: Mask ventilation without difficulty LMA: LMA inserted LMA Size: 4.0 Number of attempts: 1 Airway Equipment and Method: Bite block Placement Confirmation: positive ETCO2 Tube secured with: Tape Dental Injury: Teeth and Oropharynx as per pre-operative assessment

## 2014-10-14 NOTE — Interval H&P Note (Signed)
History and Physical Interval Note:  10/14/2014 7:28 AM  Ariel Parker  has presented today for surgery, with the diagnosis of other spontaneous disruption of anterior cruciate ligament right knee  The various methods of treatment have been discussed with the patient and family. After consideration of risks, benefits and other options for treatment, the patient has consented to  Procedure(s): RIGHT KNEE ARTHROSCOPY WITH ANTERIOR CRUCIATE LIGAMENT (ACL) REPAIR (Right) as a surgical intervention .  The patient's history has been reviewed, patient examined, no change in status, stable for surgery.  I have reviewed the patient's chart and labs.  Questions were answered to the patient's satisfaction.     [name]

## 2014-10-14 NOTE — Transfer of Care (Signed)
Immediate Anesthesia Transfer of Care Note  Patient: Ariel Parker  Procedure(s) Performed: Procedure(s): RIGHT KNEE ARTHROSCOPY WITH ANTERIOR CRUCIATE LIGAMENT (ACL) REPAIR (Right) KNEE ARTHROSCOPY WITH LATERAL MENISECTOMY (Right)  Patient Location: PACU  Anesthesia Type:GA combined with regional for post-op pain  Level of Consciousness: awake and patient cooperative  Airway & Oxygen Therapy: Patient Spontanous Breathing and Patient connected to face mask oxygen  Post-op Assessment: Report given to RN and Post -op Vital signs reviewed and stable  Post vital signs: Reviewed and stable  Last Vitals:  Filed Vitals:   10/14/14 1138  BP:   Pulse: 96  Temp:   Resp: 18    Complications: No apparent anesthesia complications

## 2014-10-14 NOTE — Progress Notes (Signed)
Assisted Dr. Rose with right, ultrasound guided, femoral block. Side rails up, monitors on throughout procedure. See vital signs in flow sheet. Tolerated Procedure well. 

## 2014-10-15 ENCOUNTER — Encounter (HOSPITAL_BASED_OUTPATIENT_CLINIC_OR_DEPARTMENT_OTHER): Payer: Self-pay | Admitting: Orthopedic Surgery

## 2014-10-15 NOTE — Op Note (Signed)
NAMEKIASIA, CHOU NO.:  [id number]  MEDICAL RECORD NO.:  [id number]  LOCATION:                             FACILITY:  [id number]  PHYSICIAN:  Loreta Ave, M.D. DATE OF BIRTH:  11/10/1990  DATE OF PROCEDURE:  10/14/2014 DATE OF DISCHARGE:  10/14/2014                              OPERATIVE REPORT   PREOPERATIVE DIAGNOSES:  Right knee anterior cruciate ligament tear and anterolateral rotatory instability.  POSTOPERATIVE DIAGNOSES:  Right knee anterior cruciate ligament tear and anterolateral rotatory instability with a small flap tear, abrasive tearing lateral meniscus posterior horn.  PROCEDURE:  Right knee exam under anesthesia, arthroscopy.  Debridement of lateral meniscus.  Arthroscopic endoscopic ACL reconstruction Arthrex instrumentation with a quadrupled hamstring autograft.  EndoButton above, washer and PushLock distally.  Notchplasty.  SURGEON:  Loreta Ave, M.D.  ASSISTANT:  Tessa Lerner, PA present throughout the entire case and necessary for timely completion of procedure.  ANESTHESIA:  General.  BLOOD LOSS:  Minimal.  SPECIMENS:  None.  CULTURES:  None.  COMPLICATIONS:  None.  DRESSINGS:  Soft compressive knee immobilizer.  TOURNIQUET TIME:  45 minutes.  DESCRIPTION OF PROCEDURE:  The patient was brought to the operating room, placed on the operating table in supine position.  After adequate anesthesia had been obtained, right knee examined.  Full motion. Positive Lachman, drawer, pivot shift.  Tourniquet applied, prepped and draped in usual sterile fashion.  Exsanguinated with elevation of Esmarch.  Tourniquet inflated to 250 mmHg.  Two portals, one each medial and lateral parapatellar.  Arthroscope was introduced.  Knee distended and inspected.  Articular cartilage intact throughout.  Fraying of the back top of the lateral meniscus debrided and tapered in smoothly. Medial meniscus intact.  Complete mid-substance  tear ACL.  ACL debrided. Notchplasty.  Instruments and fluid removed.  A longitudinal incision over the pes bursa.  Semitendinosis was harvested and then quadrupled and prepared for graft.  Guidewire through that incision up through the footprint of the ACL.  Overdrilled with a 9-mm reamer.  Debris cleared with a shaver.  Femoral guide was inserted across tibial tunnel notch on the back cortex of the femur.  Guidewire and then reamer for appropriate depth.  Debris cleared.  Tunnels were found to be in good position.  The passing devices were equal in both tunnels and out through a stab wound anterolateral thigh.  The graft was attached and pulled it across the knee pulling the EndoButton out and then receding and on the femur with fluoroscopic guidance.  Graft advanced up into the femoral tunnel seating well on both tunnels.  Washer applied distally. Graft was tensioned there and then tied down.  Exiting sutures were anchored with a PushLock pre-drilled.  At completion, nice stability, good tension of the graft in flexion and extension.  Full motion.  No impingement.  We irrigated and closed the subcutaneous and subcuticular.  Portals were closed with nylon.  Sterile compressive dressing applied.  Tourniquet deflated and removed.  Knee immobilizer applied.  Anesthesia reversed. Brought to the recovery room.  Tolerated the surgery well.  No complications.     Loreta Ave, M.D.     DFM/MEDQ  D:  10/15/2014  T:  10/15/2014  Job:  [id number]

## 2014-10-19 ENCOUNTER — Other Ambulatory Visit: Payer: Self-pay | Admitting: Orthopedic Surgery

## 2014-10-19 ENCOUNTER — Ambulatory Visit (HOSPITAL_COMMUNITY): Attending: Cardiovascular Disease

## 2014-10-19 DIAGNOSIS — M7989 Other specified soft tissue disorders: Secondary | ICD-10-CM | POA: Diagnosis not present

## 2014-10-19 DIAGNOSIS — M79604 Pain in right leg: Secondary | ICD-10-CM

## 2014-10-26 ENCOUNTER — Telehealth (HOSPITAL_COMMUNITY): Payer: Self-pay | Admitting: Physical Therapy

## 2014-10-26 ENCOUNTER — Ambulatory Visit (HOSPITAL_COMMUNITY): Payer: Federal, State, Local not specified - PPO | Attending: Orthopedic Surgery | Admitting: Physical Therapy

## 2014-10-26 DIAGNOSIS — M25661 Stiffness of right knee, not elsewhere classified: Secondary | ICD-10-CM

## 2014-10-26 DIAGNOSIS — R609 Edema, unspecified: Secondary | ICD-10-CM | POA: Diagnosis present

## 2014-10-26 DIAGNOSIS — R262 Difficulty in walking, not elsewhere classified: Secondary | ICD-10-CM

## 2014-10-26 DIAGNOSIS — Z9889 Other specified postprocedural states: Secondary | ICD-10-CM

## 2014-10-26 DIAGNOSIS — M25561 Pain in right knee: Secondary | ICD-10-CM | POA: Diagnosis present

## 2014-10-26 DIAGNOSIS — R269 Unspecified abnormalities of gait and mobility: Secondary | ICD-10-CM | POA: Diagnosis present

## 2014-10-26 DIAGNOSIS — R2681 Unsteadiness on feet: Secondary | ICD-10-CM | POA: Diagnosis present

## 2014-10-26 NOTE — Therapy (Signed)
[facility] [address] [place], [place], [address] Phone: [phone]   Fax:  [phone]  Physical Therapy Evaluation  Patient Details  Name: Ariel Parker MRN: [id number] Date of Birth: 1990-08-02 Referring Provider:  Loreta Ave, MD  Encounter Date: 10/26/2014      PT End of Session - 10/26/14 0935    Visit Number 1   Number of Visits 18   Date for PT Re-Evaluation 11/23/14   Authorization Type [employer] (PROGRESS NOTE MUST BE DONE WEEKLY FOR MILITARY, give to patient each week)   Authorization Time Period 10/26/14 to 12/26/14   PT Start Time 0848   PT Stop Time 0928   PT Time Calculation (min) 40 min   Activity Tolerance Patient tolerated treatment well;Patient limited by pain   Behavior During Therapy [facility] for tasks assessed/performed      Past Medical History  Diagnosis Date  . Disruption of anterior cruciate ligament of right knee 09/2014    Past Surgical History  Procedure Laterality Date  . Wisdom tooth extraction    . Knee arthroscopy w/ acl reconstruction Left 07/17/2006  . Knee arthroscopy with anterior cruciate ligament (acl) repair with hamstring graft Right 10/14/2014    Procedure: RIGHT KNEE ARTHROSCOPY WITH ANTERIOR CRUCIATE LIGAMENT (ACL) REPAIR;  Surgeon: Mckinley Jewel, MD;  Location: [facility];  Service: Orthopedics;  Laterality: Right;  . Knee arthroscopy with lateral menisectomy Right 10/14/2014    Procedure: KNEE ARTHROSCOPY WITH LATERAL MENISECTOMY;  Surgeon: Mckinley Jewel, MD;  Location: [facility];  Service: Orthopedics;  Laterality: Right;    There were no vitals filed for this visit.  Visit Diagnosis:  S/P ACL repair - Plan: PT plan of care cert/re-cert  Right knee pain - Plan: PT plan of care cert/re-cert  Edema - Plan: PT plan of care cert/re-cert  Knee stiffness, right - Plan: PT plan of care cert/re-cert  Abnormality of gait - Plan: PT plan of care  cert/re-cert  Difficulty walking - Plan: PT plan of care cert/re-cert  Unsteadiness - Plan: PT plan of care cert/re-cert      Subjective Assessment - 10/26/14 0852    Subjective Knee is really just uncomfortable right now, having a lot of numbess and feelings of tightness in the knee. States that she has some trouble with R ankle DF after the surgery, also has had some bruising in that area. She reports that she has not been putting much of any weight down on it due to being nervous about hurting the surgical site. Patient reports her MD did not having her doing any exercises due to MD office  and also said that MD advised her that knee would likely be stiff for a bit and may lose some range    Pertinent History Patient was playing soccer, jumped up and landed and heard a pop. ACL surgery was the 21st of July. Things at home have been going OK but at one point MD thought there might have been a blood clot, which did come back negative. Was taken off of CPM due to blood clot concern and to her knowledge is not going to be put back on CPM.    How long can you sit comfortably? Sitting in standard chair about 30 minutes    How long can you stand comfortably? 15 minutes with walker   How long can you walk comfortably? 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 No/denies  none right now but 2-3 on average             [facility] PT Assessment - 10/26/14 0001    Assessment   Medical Diagnosis s/p R ACL repair    Onset Date/Surgical Date 10/14/14   Next MD Visit August 30th with Murphy/Wainer    Precautions   Precautions Other (comment)   Precaution Comments See Murphy/Wainer protocols for ACL    Required Braces or Orthoses Knee Immobilizer - Right   Restrictions   Weight Bearing Restrictions Yes   RLE Weight Bearing Weight bearing as tolerated   Other  Position/Activity Restrictions per protocol can be WBAT with crutches right now    Balance Screen   Has the patient fallen in the past 6 months No   Has the patient had a decrease in activity level because of a fear of falling?  Yes   Is the patient reluctant to leave their home because of a fear of falling?  Yes   Observation/Other Assessments   Focus on Therapeutic Outcomes (FOTO)  73% limited    AROM   Right Knee Extension 9   Right Knee Flexion 60  pain limited today    Strength   Right Hip Flexion 3-/5   Left Hip Flexion 5/5   Right Knee Flexion --  DNT today due to pain    Right Knee Extension --  DNT today due to pain    Left Knee Flexion 4/5   Left Knee Extension 5/5   Right Ankle Dorsiflexion 5/5   Left Ankle Dorsiflexion 4/5   Ambulation/Gait   Gait Comments patient currently ambulating with minimal weight bearing R LE and bilateral crutches; informed patient that per Delbert Harness protocol she is WBAT with crutches and immobilizer                           PT Education - 10/26/14 0935    Education provided Yes   Education Details prognosis, plan of care, Delbert Harness ACL protocol including ROM precautions and weight bearing status    Person(s) Educated Patient;Parent(s)   Methods Explanation;Demonstration;Handout;Verbal cues   Comprehension Verbalized understanding;Returned demonstration          PT Short Term Goals - 10/26/14 0948    PT SHORT TERM GOAL #1   Title Patient will demonstrate R knee ROM of 0-120 degrees with no pain   Time 3   Period Weeks   Status New   PT SHORT TERM GOAL #2   Title Patient will demonstrate at least 4-/5 strength in R knee and at least 4+/5 strength in L LE    Time 3   Period Weeks   Status New   PT SHORT TERM GOAL #3   Title Patient to be ambulating unlimited distances without crutches and WBAT R LE, equal step lengths, equal weight bearing each side, minimal unsteadiness, pain no more than 1/10   Time 3    Period Weeks   Status New   PT SHORT TERM GOAL #4   Title Patient will experience 0/10 pain R knee  with all functional weight bearing tasks and exercises    Time 3   Period Weeks   Status New   PT SHORT TERM GOAL #5   Title Patient to be independent in consistently and correctly performing  appropriate HEP, to be  updated PRN    Time 3   Period Weeks   Status New           PT Long Term Goals - 10/26/14 0957    PT LONG TERM GOAL #1   Title Patient to have 0 degrees extension  and 130 degrees flexion R knee, pain 0/10   Time 6   Period Weeks   Status New   PT LONG TERM GOAL #2   Title Patient will be able to perform single leg squat to floor with R knee, minimal knee valgus, and pain 0/10   Time 6   Period Weeks   Status New   PT LONG TERM GOAL #3   Title Patient will be able to perform single leg hops of equal distance with bilateral lower extremities and pain 0/10 R knee    Time 6   Period Weeks   Status New   PT LONG TERM GOAL #4   Title Patient to be able to jump off of at least 14 inch box and land on bilateral feet with good technique, good knee positioning with minimal knee valgus, and pain R knee 0/10   Time 6   Period Weeks   Status New   PT LONG TERM GOAL #5   Title Patient to be able to maintain single leg stance on BOSU for at least 30 seconds with mild to moderate perturbations, pain R knee 0/10   Time 6   Period Weeks   Status New   Additional Long Term Goals   Additional Long Term Goals Yes   PT LONG TERM GOAL #6   Title Patient will demonstrate the ability to safely ascend and descend full height ladder and crawl around obstacles with pain R knee 0/10   Time 6   Period Weeks   Status New               Plan - 10/26/14 1610    Clinical Impression Statement Patient presents status post R ACL repair; she reports that after her surgery there was some concern for a blood clot in her leg, the testing for which did come back negative, but that  she has not been doing much of any exercise due to this complication. She also  reports that she has not been putting a lot of weight down through LE due to not really knowing weight bearing status and has been propping knee with pillows underneath; also states that MD told her knee may be a little stiff due to setback with possible blood clot. Educated patient regaarding Murphy/Wainer protocol and weight bearing status per protocol. Patient somewhat pain limited during today's session. At this time patient will benefit from skilled PT services in order to address her deficits and assist her to returning to optimal level of function.    Pt will benefit from skilled therapeutic intervention in order to improve on the following deficits Abnormal gait;Decreased coordination;Decreased range of motion;Difficulty walking;Impaired tone;Decreased safety awareness;Decreased activity tolerance;Pain;Decreased balance;Impaired flexibility;Improper body mechanics;Decreased mobility;Decreased strength;Increased edema;Postural dysfunction   Rehab Potential Excellent   PT Frequency 3x / week   PT Duration 6 weeks   PT Treatment/Interventions ADLs/Self Care Home Management;Cryotherapy;Electrical Stimulation;DME Instruction;Gait training;Stair training;Functional mobility training;Therapeutic activities;Therapeutic exercise;Balance training;Neuromuscular re-education;Patient/family education;Manual techniques;Passive range of motion   PT Next Visit Plan review HEP and goals; functional strength and stretching, gait training per protocol and as tolerated    PT Home Exercise Plan given but may adjust if MD is OK with more  aggressive HEP at this point (PT to call)   Consulted and Agree with Plan of Care Patient         Problem List There are no active problems to display for this patient.   Nedra Hai PT, DPT [phone]  [facility] [address] [place], [place],  [address] Phone: [phone]   Fax:  [phone]

## 2014-10-26 NOTE — Telephone Encounter (Signed)
Called Dr. Greig Right office in the morning to see if MD had any modifications to protocol or if patient should continue per protocol due to past complication/setback of possible blood clot, which tests were negative for. MD office returned call in the afternoon and reported that patient is OK to follow protocol with no modifications necessary at this time.   Nedra Hai PT, DPT [phone]

## 2014-10-26 NOTE — Patient Instructions (Signed)
Ankle Pumps   Lie with knees supported on bolster or sit. Straighten one knee. Keep knee straight; alternately press out and pull up heel. Emphasize movement of heel. Repeat _20__ times 3 times a day, or as often as tolerated.   DO NOT PUT A PILLOW OR BOLSTER UNDER THE KNEE!!!!  Copyright  VHI. All rights reserved.   Quad Set   With other leg bent, foot flat, slowly tighten muscles on thigh of straight leg while counting out loud to _2___. Repeat with other leg. Repeat _10-15___ times. Do at least 3 sets per day, or as much as you can tolerate .  http://gt2.exer.us/276   Copyright  VHI. All rights reserved.   Quadriceps (Straight Leg Raises)    Keeping knee straight, lift __12__ inches. May do exercise sitting with back against wall. Keep back straight. Hold _0___ seconds. Repeat _10___ times. Do _3___ sessions per day. CAUTION: Move slowly, avoid jerking.  At first use a helper to support the leg until you get stronger and can do this on your own without pain; helper should have one hand under ankle and one hand under knee.  Copyright  VHI. All rights reserved.   ABDUCTION: Supine (Active)   Lie on back, legs straight. Draw right leg out to the side as far as possible. Use _0__ lbs. Complete __1_ sets of 10___ repetitions. Perform _3__ sessions per day.  Use a helper until you feel strong enough to do this on your own without pain. Helper should have one hand supporting ankle and one hand supporting knee.   Copyright  VHI. All rights reserved.

## 2014-10-28 ENCOUNTER — Ambulatory Visit (HOSPITAL_COMMUNITY): Payer: Federal, State, Local not specified - PPO | Admitting: Physical Therapy

## 2014-10-28 DIAGNOSIS — Z9889 Other specified postprocedural states: Secondary | ICD-10-CM

## 2014-10-28 DIAGNOSIS — R269 Unspecified abnormalities of gait and mobility: Secondary | ICD-10-CM

## 2014-10-28 DIAGNOSIS — R262 Difficulty in walking, not elsewhere classified: Secondary | ICD-10-CM

## 2014-10-28 DIAGNOSIS — R609 Edema, unspecified: Secondary | ICD-10-CM

## 2014-10-28 DIAGNOSIS — M25661 Stiffness of right knee, not elsewhere classified: Secondary | ICD-10-CM

## 2014-10-28 DIAGNOSIS — R2681 Unsteadiness on feet: Secondary | ICD-10-CM

## 2014-10-28 DIAGNOSIS — M25561 Pain in right knee: Secondary | ICD-10-CM

## 2014-10-28 NOTE — Therapy (Signed)
[facility] [address] [place], [place], [address] Phone: [phone]   Fax:  [phone]  Physical Therapy Treatment  Patient Details  Name: Ariel Parker MRN: [id number] Date of Birth: 11-15-1990 Referring Provider:  Loreta Ave, MD  Encounter Date: 10/28/2014      PT End of Session - 10/28/14 1404    Visit Number 2   Number of Visits 18   Date for PT Re-Evaluation 11/23/14   Authorization Type [employer] (PROGRESS NOTE MUST BE DONE WEEKLY FOR MILITARY, give to patient each week)   Authorization Time Period 10/26/14 to 12/26/14   PT Start Time 1156   PT Stop Time 1235   PT Time Calculation (min) 39 min   Activity Tolerance Patient tolerated treatment well;Patient limited by pain   Behavior During Therapy [facility] for tasks assessed/performed      Past Medical History  Diagnosis Date  . Disruption of anterior cruciate ligament of right knee 09/2014    Past Surgical History  Procedure Laterality Date  . Wisdom tooth extraction    . Knee arthroscopy w/ acl reconstruction Left 07/17/2006  . Knee arthroscopy with anterior cruciate ligament (acl) repair with hamstring graft Right 10/14/2014    Procedure: RIGHT KNEE ARTHROSCOPY WITH ANTERIOR CRUCIATE LIGAMENT (ACL) REPAIR;  Surgeon: Mckinley Jewel, MD;  Location: [facility];  Service: Orthopedics;  Laterality: Right;  . Knee arthroscopy with lateral menisectomy Right 10/14/2014    Procedure: KNEE ARTHROSCOPY WITH LATERAL MENISECTOMY;  Surgeon: Mckinley Jewel, MD;  Location:  SUR[facility]ERY C[facility];  Service: Orthopedics;  Laterality: Right;    There were no vitals filed for this visit.  Visit Diagnosis:  S/P ACL repair  Right knee pain  Edema  Knee stiffness, right  Abnormality of gait  Difficulty walking  Unsteadiness      Subjective Assessment - 10/28/14 1358    Subjective Patient reports that she is still having around 2-3/10 pain but that she is already  feeling better due to exercises; she is able to extend her knee more but is concerned regarding some apparent loss of pain free flexion right now. Has tried to start walking WBAT with B crutches but her ankle is starting to hurt a bit.    Pertinent History Patient was playing soccer, jumped up and landed and heard a pop. ACL surgery was the 21st of July. Things at home have been going OK but at one point MD thought there might have been a blood clot, which did come back negative. Was taken off of CPM due to blood clot concern and to her knowledge is not going to be put back on CPM.    Currently in Pain? Yes   Pain Score 3    Pain Location Knee   Pain Orientation Right                         [facility] Adult PT Treatment/Exercise - 10/28/14 0001    Knee/Hip Exercises: Stretches   Active Hamstring Stretch 2 reps;Right;30 seconds   Active Hamstring Stretch Limitations 12 inch box    Gastroc Stretch Right;3 reps;30 seconds   Gastroc Stretch Limitations slantboard    Knee/Hip Exercises: Standing   Heel Raises Both;1 set;15 reps   Hip Extension Right;1 set;10 reps   Other Standing Knee Exercises Lateral weight shifts 1x30   Knee/Hip Exercises: Supine   Quad Sets Right;1 set;15 reps   Quad Sets Limitations 3 second holds  Straight Leg Raises Right;2 sets;15 reps   Straight Leg Raises Limitations min guard    Straight Leg Raise with External Rotation Right;2 sets;10 reps   Straight Leg Raise with External Rotation Limitations min guard    Patellar Mobs demonstrated self-mobs in long sitting    Other Supine Knee/Hip Exercises PROM for knee flexion only able to reach approx 40-50 degrees today due to pain                 PT Education - 10/28/14 1404    Education provided Yes   Education Details updated HEP, plan of care, PT to call MD if flexion remains this painful during next week    Person(s) Educated Patient;Parent(s)   Methods Explanation   Comprehension  Verbalized understanding          PT Short Term Goals - 10/26/14 0948    PT SHORT TERM GOAL #1   Title Patient will demonstrate R knee ROM of 0-120 degrees with no pain   Time 3   Period Weeks   Status New   PT SHORT TERM GOAL #2   Title Patient will demonstrate at least 4-/5 strength in R knee and at least 4+/5 strength in L LE    Time 3   Period Weeks   Status New   PT SHORT TERM GOAL #3   Title Patient to be ambulating unlimited distances without crutches and WBAT R LE, equal step lengths, equal weight bearing each side, minimal unsteadiness, pain no more than 1/10   Time 3   Period Weeks   Status New   PT SHORT TERM GOAL #4   Title Patient will experience 0/10 pain R knee  with all functional weight bearing tasks and exercises    Time 3   Period Weeks   Status New   PT SHORT TERM GOAL #5   Title Patient to be independent in consistently and correctly performing  appropriate HEP, to be updated PRN    Time 3   Period Weeks   Status New           PT Long Term Goals - 10/26/14 0957    PT LONG TERM GOAL #1   Title Patient to have 0 degrees extension  and 130 degrees flexion R knee, pain 0/10   Time 6   Period Weeks   Status New   PT LONG TERM GOAL #2   Title Patient will be able to perform single leg squat to floor with R knee, minimal knee valgus, and pain 0/10   Time 6   Period Weeks   Status New   PT LONG TERM GOAL #3   Title Patient will be able to perform single leg hops of equal distance with bilateral lower extremities and pain 0/10 R knee    Time 6   Period Weeks   Status New   PT LONG TERM GOAL #4   Title Patient to be able to jump off of at least 14 inch box and land on bilateral feet with good technique, good knee positioning with minimal knee valgus, and pain R knee 0/10   Time 6   Period Weeks   Status New   PT LONG TERM GOAL #5   Title Patient to be able to maintain single leg stance on BOSU for at least 30 seconds with mild to moderate  perturbations, pain R knee 0/10   Time 6   Period Weeks   Status New   Additional Long  Term Goals   Additional Long Term Goals Yes   PT LONG TERM GOAL #6   Title Patient will demonstrate the ability to safely ascend and descend full height ladder and crawl around obstacles with pain R knee 0/10   Time 6   Period Weeks   Status New               Plan - 10/28/14 1405    Clinical Impression Statement Continued with progression of functional exercise and stretching today, also continued to encourage patient to ambulate WBAT with crutches as tolerated and able right now. Patient's passive extension appears improved but passive flexion was very litmited today, perhaps approximately 40-50 degrees before pain. Recommened monitoring flexion and contacting MD if it remains limited and painful over next couple treatment sessions.    Pt will benefit from skilled therapeutic intervention in order to improve on the following deficits Abnormal gait;Decreased coordination;Decreased range of motion;Difficulty walking;Impaired tone;Decreased safety awareness;Decreased activity tolerance;Pain;Decreased balance;Impaired flexibility;Improper body mechanics;Decreased mobility;Decreased strength;Increased edema;Postural dysfunction   Rehab Potential Excellent   PT Frequency 3x / week   PT Duration 6 weeks   PT Treatment/Interventions ADLs/Self Care Home Management;Cryotherapy;Electrical Stimulation;DME Instruction;Gait training;Stair training;Functional mobility training;Therapeutic activities;Therapeutic exercise;Balance training;Neuromuscular re-education;Patient/family education;Manual techniques;Passive range of motion   PT Next Visit Plan Continue functional strength and stretching per protocol; gait training WBAT with crutches as tolerated; give patient progress note from previous 3 sessions on 8/10 appointment    PT Home Exercise Plan updated today, please continue to do so as appropriate    Consulted  and Agree with Plan of Care Patient        Problem List There are no active problems to display for this patient.   Nedra Hai PT, DPT [phone]  [facility] [address] [place], [place], [address] Phone: [phone]   Fax:  [phone]

## 2014-10-28 NOTE — Patient Instructions (Signed)
   Lateral Weight Shifts  Shift weight on either feet holding onto the counter top; repeat 30 times twice a day, bearing as much weight as you can tolerate through R leg.     HIP EXTENSION - STANDING  While standing, move your leg back as shown.  Use your arms for support if needed for balance and safety.  Do with R leg for now. Repeat 15 times, twice a day.      Patellar Mobilization  Relax the knee cap and gently move the knee cap (painlessly) side to side and up and down 20 times, 3 times a day.    AS YOU ARE ABLE TO TOLERATE, WEIGHT BEAR AS TOLERATED AS YOU ARE WALKING AROUND

## 2014-11-01 ENCOUNTER — Ambulatory Visit (HOSPITAL_COMMUNITY): Payer: Federal, State, Local not specified - PPO | Admitting: Physical Therapy

## 2014-11-01 DIAGNOSIS — M25561 Pain in right knee: Secondary | ICD-10-CM

## 2014-11-01 DIAGNOSIS — R2681 Unsteadiness on feet: Secondary | ICD-10-CM

## 2014-11-01 DIAGNOSIS — R262 Difficulty in walking, not elsewhere classified: Secondary | ICD-10-CM

## 2014-11-01 DIAGNOSIS — R609 Edema, unspecified: Secondary | ICD-10-CM

## 2014-11-01 DIAGNOSIS — Z9889 Other specified postprocedural states: Secondary | ICD-10-CM

## 2014-11-01 DIAGNOSIS — R269 Unspecified abnormalities of gait and mobility: Secondary | ICD-10-CM

## 2014-11-01 DIAGNOSIS — M25661 Stiffness of right knee, not elsewhere classified: Secondary | ICD-10-CM

## 2014-11-01 NOTE — Therapy (Signed)
[facility] [address] [place], [place], [address] Phone: [phone]   Fax:  [phone]  Physical Therapy Treatment  Patient Details  Name: Ariel Parker MRN: [id number] Date of Birth: February 14, 1991 Referring Provider:  Loreta Ave, MD  Encounter Date: 11/01/2014      PT End of Session - 11/01/14 1713    Visit Number 3   Number of Visits 18   Date for PT Re-Evaluation 11/23/14   Authorization Type [employer] (PROGRESS NOTE MUST BE DONE WEEKLY FOR MILITARY, give to patient each week)   Authorization Time Period 10/26/14 to 12/26/14   PT Start Time 1600   PT Stop Time 1700   PT Time Calculation (min) 60 min   Activity Tolerance Patient tolerated treatment well;Patient limited by pain   Behavior During Therapy [facility] for tasks assessed/performed      Past Medical History  Diagnosis Date  . Disruption of anterior cruciate ligament of right knee 09/2014    Past Surgical History  Procedure Laterality Date  . Wisdom tooth extraction    . Knee arthroscopy w/ acl reconstruction Left 07/17/2006  . Knee arthroscopy with anterior cruciate ligament (acl) repair with hamstring graft Right 10/14/2014    Procedure: RIGHT KNEE ARTHROSCOPY WITH ANTERIOR CRUCIATE LIGAMENT (ACL) REPAIR;  Surgeon: Mckinley Jewel, MD;  Location: [facility];  Service: Orthopedics;  Laterality: Right;  . Knee arthroscopy with lateral menisectomy Right 10/14/2014    Procedure: KNEE ARTHROSCOPY WITH LATERAL MENISECTOMY;  Surgeon: Mckinley Jewel, MD;  Location: [facility];  Service: Orthopedics;  Laterality: Right;    There were no vitals filed for this visit.  Visit Diagnosis:  S/P ACL repair  Right knee pain  Edema  Knee stiffness, right  Abnormality of gait  Difficulty walking  Unsteadiness      Subjective Assessment - 11/01/14 1834    Subjective Pt states she is having weird burning in the side of her leg where it was numb.   States she's been using her CPM some trying to increase her ROM   Currently in Pain? Yes   Pain Score 3    Pain Location Knee   Pain Orientation Right            [facility] PT Assessment - 11/01/14 0001    AROM   Right Knee Flexion 68                     [facility] Adult PT Treatment/Exercise - 11/01/14 1709    Knee/Hip Exercises: Supine   Quad Sets 20 reps   Short Arc Quad Sets Right;10 reps   Straight Leg Raises Right;10 reps;AAROM   Other Supine Knee/Hip Exercises PROM/ AAROM to 68 degrees   Knee/Hip Exercises: Sidelying   Hip ABduction Right;10 reps   Knee/Hip Exercises: Prone   Hamstring Curl 10 reps   Hip Extension 10 reps   Manual Therapy   Manual Therapy Edema management;Myofascial release   Manual therapy comments to Rt LE with elevation   Edema Management retro massage   Myofascial Release to scar in quad region and inferior knee                  PT Short Term Goals - 10/26/14 0948    PT SHORT TERM GOAL #1   Title Patient will demonstrate R knee ROM of 0-120 degrees with no pain   Time 3   Period Weeks   Status New   PT SHORT  TERM GOAL #2   Title Patient will demonstrate at least 4-/5 strength in R knee and at least 4+/5 strength in L LE    Time 3   Period Weeks   Status New   PT SHORT TERM GOAL #3   Title Patient to be ambulating unlimited distances without crutches and WBAT R LE, equal step lengths, equal weight bearing each side, minimal unsteadiness, pain no more than 1/10   Time 3   Period Weeks   Status New   PT SHORT TERM GOAL #4   Title Patient will experience 0/10 pain R knee  with all functional weight bearing tasks and exercises    Time 3   Period Weeks   Status New   PT SHORT TERM GOAL #5   Title Patient to be independent in consistently and correctly performing  appropriate HEP, to be updated PRN    Time 3   Period Weeks   Status New           PT Long Term Goals - 10/26/14 0957    PT LONG TERM GOAL #1   Title  Patient to have 0 degrees extension  and 130 degrees flexion R knee, pain 0/10   Time 6   Period Weeks   Status New   PT LONG TERM GOAL #2   Title Patient will be able to perform single leg squat to floor with R knee, minimal knee valgus, and pain 0/10   Time 6   Period Weeks   Status New   PT LONG TERM GOAL #3   Title Patient will be able to perform single leg hops of equal distance with bilateral lower extremities and pain 0/10 R knee    Time 6   Period Weeks   Status New   PT LONG TERM GOAL #4   Title Patient to be able to jump off of at least 14 inch box and land on bilateral feet with good technique, good knee positioning with minimal knee valgus, and pain R knee 0/10   Time 6   Period Weeks   Status New   PT LONG TERM GOAL #5   Title Patient to be able to maintain single leg stance on BOSU for at least 30 seconds with mild to moderate perturbations, pain R knee 0/10   Time 6   Period Weeks   Status New   Additional Long Term Goals   Additional Long Term Goals Yes   PT LONG TERM GOAL #6   Title Patient will demonstrate the ability to safely ascend and descend full height ladder and crawl around obstacles with pain R knee 0/10   Time 6   Period Weeks   Status New               Plan - 11/01/14 1714    Clinical Impression Statement Continued focus on improving Rt knee mobility.  Instruced with gait with focus on placing Rt LE down to increase WB thorugh Rt and normalize gait.  Began manual to decrease edema in LE and mobilize adhesions and scar tissue that may be preventing futhter motion.  NOted hard ball of scar tissue beneath the superior knee.  PT reported noted improvment in mobility following manual techniques with increase of 8 degrees in flexion.   Encouarged patient to be diligent on exericses and moisturize surrounding tissue.    PT Next Visit Plan Continue functional strength and stretching per protocol; gait training WBAT with crutches as tolerated; give  patient  progress note from previous 3 sessions on 8/10 appointment    PT Home Exercise Plan global SLR and SAQ given today   Consulted and Agree with Plan of Care Patient        Problem List There are no active problems to display for this patient.   Lurena Nida, PTA/CLT [phone]  11/01/2014, 6:40 PM  [facility] [address] [place], [place], [address] Phone: [phone]   Fax:  [phone]

## 2014-11-03 ENCOUNTER — Ambulatory Visit (HOSPITAL_COMMUNITY): Payer: Federal, State, Local not specified - PPO | Admitting: Physical Therapy

## 2014-11-03 DIAGNOSIS — Z9889 Other specified postprocedural states: Secondary | ICD-10-CM | POA: Diagnosis not present

## 2014-11-03 DIAGNOSIS — M25561 Pain in right knee: Secondary | ICD-10-CM

## 2014-11-03 DIAGNOSIS — R262 Difficulty in walking, not elsewhere classified: Secondary | ICD-10-CM

## 2014-11-03 DIAGNOSIS — R269 Unspecified abnormalities of gait and mobility: Secondary | ICD-10-CM

## 2014-11-03 DIAGNOSIS — R609 Edema, unspecified: Secondary | ICD-10-CM

## 2014-11-03 DIAGNOSIS — M25661 Stiffness of right knee, not elsewhere classified: Secondary | ICD-10-CM

## 2014-11-03 DIAGNOSIS — R2681 Unsteadiness on feet: Secondary | ICD-10-CM

## 2014-11-03 NOTE — Therapy (Signed)
[facility] [address] [place], [place], [address] Phone: [phone]   Fax:  [phone]  Physical Therapy Treatment  Patient Details  Name: Ariel Parker MRN: [id number] Date of Birth: [birth year] Referring Provider:  Loreta Ave, MD  Encounter Date: 11/03/2014      PT End of Session - 11/03/14 1706    Visit Number 4   Number of Visits 18   Date for PT Re-Evaluation 11/23/14   Authorization Type [employer] (PROGRESS NOTE MUST BE DONE WEEKLY FOR MILITARY, give to patient each Friday)   Authorization Time Period 10/26/14 to 12/26/14   PT Start Time 1600   PT Stop Time 1655   PT Time Calculation (min) 55 min   Activity Tolerance Patient tolerated treatment well;Patient limited by pain   Behavior During Therapy [facility] for tasks assessed/performed      Past Medical History  Diagnosis Date  . Disruption of anterior cruciate ligament of right knee 09/2014    Past Surgical History  Procedure Laterality Date  . Wisdom tooth extraction    . Knee arthroscopy w/ acl reconstruction Left 07/17/2006  . Knee arthroscopy with anterior cruciate ligament (acl) repair with hamstring graft Right 10/14/2014    Procedure: RIGHT KNEE ARTHROSCOPY WITH ANTERIOR CRUCIATE LIGAMENT (ACL) REPAIR;  Surgeon: Mckinley Jewel, MD;  Location: [facility];  Service: Orthopedics;  Laterality: Right;  . Knee arthroscopy with lateral menisectomy Right 10/14/2014    Procedure: KNEE ARTHROSCOPY WITH LATERAL MENISECTOMY;  Surgeon: Mckinley Jewel, MD;  Location: [facility];  Service: Orthopedics;  Laterality: Right;    There were no vitals filed for this visit.  Visit Diagnosis:  S/P ACL repair  Right knee pain  Edema  Knee stiffness, right  Abnormality of gait  Unsteadiness  Difficulty walking      Subjective Assessment - 11/03/14 1800    Subjective Pt states it feels much better since beginning manual last session.  States she  has been working on her exericses diligently.   Currently in Pain? No/denies            [facility] PT Assessment - 11/03/14 1703    Assessment   Medical Diagnosis s/p R ACL repair    Onset Date/Surgical Date 10/14/14   Next MD Visit August 30th with [facility]    Restrictions   RLE Weight Bearing Weight bearing as tolerated   AROM   Right Knee Flexion 70                     [facility] Adult PT Treatment/Exercise - 11/03/14 0001    Knee/Hip Exercises: Stretches   Active Hamstring Stretch Right;20 seconds;5 reps   Active Hamstring Stretch Limitations 8 inch box   Gastroc Stretch Right;3 reps;30 seconds   Gastroc Stretch Limitations slantboard    Knee/Hip Exercises: Standing   Heel Raises Both;15 reps   Heel Raises Limitations toeraises 15 reps   Knee Flexion Right;10 reps   Forward Lunges Right;10 reps   Forward Lunges Limitations onto 8" to increase flexion with 5" holds   Knee/Hip Exercises: Supine   Quad Sets 20 reps   Short Arc Quad Sets Right;15 reps   Straight Leg Raises 15 reps   Other Supine Knee/Hip Exercises PROM/ AAROM to 68 degrees   Knee/Hip Exercises: Sidelying   Hip ABduction 15 reps   Knee/Hip Exercises: Prone   Hamstring Curl 15 reps   Hip Extension 15 reps   Manual Therapy   Manual  Therapy Edema management;Myofascial release   Manual therapy comments to Rt LE with elevation   Edema Management retro massage   Myofascial Release to scar in quad region and inferior knee                  PT Short Term Goals - 10/26/14 0948    PT SHORT TERM GOAL #1   Title Patient will demonstrate R knee ROM of 0-120 degrees with no pain   Time 3   Period Weeks   Status New   PT SHORT TERM GOAL #2   Title Patient will demonstrate at least 4-/5 strength in R knee and at least 4+/5 strength in L LE    Time 3   Period Weeks   Status New   PT SHORT TERM GOAL #3   Title Patient to be ambulating unlimited distances without crutches and WBAT R LE, equal  step lengths, equal weight bearing each side, minimal unsteadiness, pain no more than 1/10   Time 3   Period Weeks   Status New   PT SHORT TERM GOAL #4   Title Patient will experience 0/10 pain R knee  with all functional weight bearing tasks and exercises    Time 3   Period Weeks   Status New   PT SHORT TERM GOAL #5   Title Patient to be independent in consistently and correctly performing  appropriate HEP, to be updated PRN    Time 3   Period Weeks   Status New           PT Long Term Goals - 10/26/14 0957    PT LONG TERM GOAL #1   Title Patient to have 0 degrees extension  and 130 degrees flexion R knee, pain 0/10   Time 6   Period Weeks   Status New   PT LONG TERM GOAL #2   Title Patient will be able to perform single leg squat to floor with R knee, minimal knee valgus, and pain 0/10   Time 6   Period Weeks   Status New   PT LONG TERM GOAL #3   Title Patient will be able to perform single leg hops of equal distance with bilateral lower extremities and pain 0/10 R knee    Time 6   Period Weeks   Status New   PT LONG TERM GOAL #4   Title Patient to be able to jump off of at least 14 inch box and land on bilateral feet with good technique, good knee positioning with minimal knee valgus, and pain R knee 0/10   Time 6   Period Weeks   Status New   PT LONG TERM GOAL #5   Title Patient to be able to maintain single leg stance on BOSU for at least 30 seconds with mild to moderate perturbations, pain R knee 0/10   Time 6   Period Weeks   Status New   Additional Long Term Goals   Additional Long Term Goals Yes   PT LONG TERM GOAL #6   Title Patient will demonstrate the ability to safely ascend and descend full height ladder and crawl around obstacles with pain R knee 0/10   Time 6   Period Weeks   Status New               Plan - 11/03/14 1707    Clinical Impression Statement Much improved quad strength today.  PT was able to complete SLR independently with  minor  extension lag.  Progressed to standing heel/toe raises, Rt LE lunge to increase ROM and standing hamstring curls.  continued with manual to decrease swelling and scar tissue.  Noted improvment of adhesions distal quad and overall mobility of knee.  Able to achieve 70 degrees flexion today.     PT Next Visit Plan Continue functional strength and stretching per ACL protocol; Give patient progress note for [employer] on 8/12 appointment (she will fax).  Progress weight bearing through Rt LE with rockerboard and attmept decreasing to 1 crutch with ambulation.  start PRE's beginning with 1# when patient is able to bend knee to 90 degrees.      Recommended Other Services ACL protocol.  Pt is 3 weeks on 8/11, 4 weeks on 8/18.  Progress to phase II when flexion is 90 degrees and phase III when flexion is 120 degrees   Consulted and Agree with Plan of Care Patient        Problem List There are no active problems to display for this patient.   Lurena Nida, PTA/CLT [phone]  11/03/2014, 6:01 PM  [facility] [address] [place], [place], [address] Phone: [phone]   Fax:  [phone]

## 2014-11-05 ENCOUNTER — Ambulatory Visit (HOSPITAL_COMMUNITY): Payer: Federal, State, Local not specified - PPO

## 2014-11-05 DIAGNOSIS — R2681 Unsteadiness on feet: Secondary | ICD-10-CM

## 2014-11-05 DIAGNOSIS — Z9889 Other specified postprocedural states: Secondary | ICD-10-CM

## 2014-11-05 DIAGNOSIS — M25661 Stiffness of right knee, not elsewhere classified: Secondary | ICD-10-CM

## 2014-11-05 DIAGNOSIS — M25561 Pain in right knee: Secondary | ICD-10-CM

## 2014-11-05 DIAGNOSIS — R269 Unspecified abnormalities of gait and mobility: Secondary | ICD-10-CM

## 2014-11-05 DIAGNOSIS — R609 Edema, unspecified: Secondary | ICD-10-CM

## 2014-11-05 DIAGNOSIS — R262 Difficulty in walking, not elsewhere classified: Secondary | ICD-10-CM

## 2014-11-05 NOTE — Therapy (Signed)
[facility] [address] [place], [place], [address] Phone: [phone]   Fax:  [phone]  Physical Therapy Treatment  Patient Details  Name: Ariel Parker MRN: [id number] Date of Birth: 1990-12-04 Referring Provider:  Babs Sciara, MD  Encounter Date: 11/05/2014      PT End of Session - 11/05/14 1617    Visit Number 5   Number of Visits 18   Date for PT Re-Evaluation 11/23/14   Authorization Type [employer] (PROGRESS NOTE MUST BE DONE WEEKLY FOR MILITARY, give to patient each Friday)   Authorization Time Period 10/26/14 to 12/26/14   PT Start Time 1601   PT Stop Time 1648   PT Time Calculation (min) 47 min   Activity Tolerance Patient tolerated treatment well   Behavior During Therapy [facility] for tasks assessed/performed      Past Medical History  Diagnosis Date  . Disruption of anterior cruciate ligament of right knee 09/2014    Past Surgical History  Procedure Laterality Date  . Wisdom tooth extraction    . Knee arthroscopy w/ acl reconstruction Left 07/17/2006  . Knee arthroscopy with anterior cruciate ligament (acl) repair with hamstring graft Right 10/14/2014    Procedure: RIGHT KNEE ARTHROSCOPY WITH ANTERIOR CRUCIATE LIGAMENT (ACL) REPAIR;  Surgeon: Mckinley Jewel, MD;  Location: [facility];  Service: Orthopedics;  Laterality: Right;  . Knee arthroscopy with lateral menisectomy Right 10/14/2014    Procedure: KNEE ARTHROSCOPY WITH LATERAL MENISECTOMY;  Surgeon: Mckinley Jewel, MD;  Location: [facility];  Service: Orthopedics;  Laterality: Right;    There were no vitals filed for this visit.  Visit Diagnosis:  S/P ACL repair  Right knee pain  Edema  Knee stiffness, right  Abnormality of gait  Unsteadiness  Difficulty walking      Subjective Assessment - 11/05/14 1611    Subjective Pt stated knee is feeling good today, reports good progress followng manual last session.   Currently in  Pain? No/denies               [facility] Adult PT Treatment/Exercise - 11/05/14 0001    Exercises   Exercises Knee/Hip   Knee/Hip Exercises: Stretches   Gastroc Stretch Right;3 reps;30 seconds   Gastroc Stretch Limitations slantboard    Knee/Hip Exercises: Standing   Heel Raises Both;15 reps   Heel Raises Limitations toeraises 15 reps   Knee Flexion Right;10 reps   Forward Lunges Right;10 reps   Forward Lunges Limitations onto 8" to increase flexion with 5" holds   Rocker Board 2 minutes   Rocker Board Limitations R/L   Gait Training Gait training 526 feet with 1 crutch   Other Standing Knee Exercises Lateral weight shifts 1x30   Knee/Hip Exercises: Supine   Quad Sets 20 reps   Short Arc Quad Sets Right;15 reps   Heel Slides 10 reps   Straight Leg Raises 15 reps   Other Supine Knee/Hip Exercises PROM/ AAROM to 68 degrees   Knee/Hip Exercises: Prone   Hamstring Curl 15 reps   Manual Therapy   Manual Therapy Edema management;Myofascial release   Manual therapy comments to Rt LE with elevation   Edema Management retro massage   Myofascial Release to scar in quad region and inferior knee            PT Short Term Goals - 10/26/14 0948    PT SHORT TERM GOAL #1   Title Patient will demonstrate R knee ROM of 0-120 degrees with  no pain   Time 3   Period Weeks   Status New   PT SHORT TERM GOAL #2   Title Patient will demonstrate at least 4-/5 strength in R knee and at least 4+/5 strength in L LE    Time 3   Period Weeks   Status New   PT SHORT TERM GOAL #3   Title Patient to be ambulating unlimited distances without crutches and WBAT R LE, equal step lengths, equal weight bearing each side, minimal unsteadiness, pain no more than 1/10   Time 3   Period Weeks   Status New   PT SHORT TERM GOAL #4   Title Patient will experience 0/10 pain R knee  with all functional weight bearing tasks and exercises    Time 3   Period Weeks   Status New   PT SHORT TERM GOAL #5    Title Patient to be independent in consistently and correctly performing  appropriate HEP, to be updated PRN    Time 3   Period Weeks   Status New           PT Long Term Goals - 10/26/14 0957    PT LONG TERM GOAL #1   Title Patient to have 0 degrees extension  and 130 degrees flexion R knee, pain 0/10   Time 6   Period Weeks   Status New   PT LONG TERM GOAL #2   Title Patient will be able to perform single leg squat to floor with R knee, minimal knee valgus, and pain 0/10   Time 6   Period Weeks   Status New   PT LONG TERM GOAL #3   Title Patient will be able to perform single leg hops of equal distance with bilateral lower extremities and pain 0/10 R knee    Time 6   Period Weeks   Status New   PT LONG TERM GOAL #4   Title Patient to be able to jump off of at least 14 inch box and land on bilateral feet with good technique, good knee positioning with minimal knee valgus, and pain R knee 0/10   Time 6   Period Weeks   Status New   PT LONG TERM GOAL #5   Title Patient to be able to maintain single leg stance on BOSU for at least 30 seconds with mild to moderate perturbations, pain R knee 0/10   Time 6   Period Weeks   Status New   Additional Long Term Goals   Additional Long Term Goals Yes   PT LONG TERM GOAL #6   Title Patient will demonstrate the ability to safely ascend and descend full height ladder and crawl around obstacles with pain R knee 0/10   Time 6   Period Weeks   Status New               Plan - 11/05/14 1617    Clinical Impression Statement Began rockerboard to improve weight bearing and instructed gait triaining with 1 crutch.  MIn cueing to equalize stance phase and stride length, no LOB episodes noted.  Pt continues to demonstrate weak quadricep with extension lag with SLR and impaired AROM 10-78 degrees.  Pt stated it feels like a screw in knee at 78 degrees with reports of discomfort with 70 degrees plus knee flexion.  Pt was given copies of  progress note to be faxed to [employer].   PT Next Visit Plan Continue functional strength and stretching per  ACL protocol;   Continue with weight bearing activities through Rt LE with rockerboard and 1 crutch with ambulation.  start PRE's beginning with 1# when patient is able to bend knee to 90 degrees.      Recommended Other Services ACL protocol. Pt is 3 weeks on 8/11, 4 weeks on 8/18. Progress to phase II when flexion is 90 degrees and phase III when flexion is 120 degreestaken         Problem List There are no active problems to display for this patient.  [address], LPTA; CBIS [phone]  Juel Burrow 11/05/2014, 5:17 PM  [facility] [address] [place], [place], [address] Phone: [phone]   Fax:  [phone]

## 2014-11-08 ENCOUNTER — Ambulatory Visit (HOSPITAL_COMMUNITY): Payer: Federal, State, Local not specified - PPO | Admitting: Physical Therapy

## 2014-11-08 DIAGNOSIS — Z9889 Other specified postprocedural states: Secondary | ICD-10-CM | POA: Diagnosis not present

## 2014-11-08 DIAGNOSIS — M25561 Pain in right knee: Secondary | ICD-10-CM

## 2014-11-08 DIAGNOSIS — R2681 Unsteadiness on feet: Secondary | ICD-10-CM

## 2014-11-08 DIAGNOSIS — R262 Difficulty in walking, not elsewhere classified: Secondary | ICD-10-CM

## 2014-11-08 DIAGNOSIS — M25661 Stiffness of right knee, not elsewhere classified: Secondary | ICD-10-CM

## 2014-11-08 NOTE — Therapy (Signed)
[facility] [address] [place], [place], [address] Phone: [phone]   Fax:  [phone]  Physical Therapy Treatment  Patient Details  Name: Ariel Parker MRN: [id number] Date of Birth: [birth year] Referring Provider:  Babs Sciara, MD  Encounter Date: 11/08/2014      PT End of Session - 11/08/14 1654    Visit Number 6   Number of Visits 18   Date for PT Re-Evaluation 11/23/14   Authorization Type [employer] (PROGRESS NOTE MUST BE DONE WEEKLY FOR MILITARY, give to patient each Friday)   Authorization Time Period 10/26/14 to 12/26/14   PT Start Time 1600   PT Stop Time 1645   PT Time Calculation (min) 45 min      Past Medical History  Diagnosis Date  . Disruption of anterior cruciate ligament of right knee 09/2014    Past Surgical History  Procedure Laterality Date  . Wisdom tooth extraction    . Knee arthroscopy w/ acl reconstruction Left 07/17/2006  . Knee arthroscopy with anterior cruciate ligament (acl) repair with hamstring graft Right 10/14/2014    Procedure: RIGHT KNEE ARTHROSCOPY WITH ANTERIOR CRUCIATE LIGAMENT (ACL) REPAIR;  Surgeon: Mckinley Jewel, MD;  Location: [facility];  Service: Orthopedics;  Laterality: Right;  . Knee arthroscopy with lateral menisectomy Right 10/14/2014    Procedure: KNEE ARTHROSCOPY WITH LATERAL MENISECTOMY;  Surgeon: Mckinley Jewel, MD;  Location: [facility];  Service: Orthopedics;  Laterality: Right;    There were no vitals filed for this visit.  Visit Diagnosis:  S/P ACL repair  Right knee pain  Knee stiffness, right  Unsteadiness  Difficulty walking      Subjective Assessment - 11/08/14 1605    Subjective Pt denies having any pain today, reports that she has been compliant with her HEP and she has been ambulating with one crutch without any difficulty.    Currently in Pain? No/denies   Pain Score 0-No pain               [facility] Adult PT  Treatment/Exercise - 11/08/14 0001    Knee/Hip Exercises: Stretches   Active Hamstring Stretch 3 reps;30 seconds   Active Hamstring Stretch Limitations 12 inch step   Gastroc Stretch Right;3 reps;30 seconds   Gastroc Stretch Limitations slantboard    Knee/Hip Exercises: Standing   Heel Raises Both;15 reps   Knee Flexion Right;15 reps   Forward Lunges 10 reps   Forward Lunges Limitations onto 6" step with 5 second holds   Side Lunges 10 reps   Side Lunges Limitations 8" box   Functional Squat 10 reps   Functional Squat Limitations mini squats to 40 degrees   Rocker Board 2 minutes   Rocker Board Limitations R/L   Knee/Hip Exercises: Supine   Quad Sets 20 reps   Quad Sets Limitations 3 second hold   Short Arc [employer] Right;15 reps   Short Arc Quad Sets Limitations 2 second hold   Heel Slides 10 reps   Straight Leg Raises 2 sets;10 reps   Other Supine Knee/Hip Exercises PROM/AAROM to 80 degrees flexion   Knee/Hip Exercises: Sidelying   Hip ABduction 15 reps;Right   Knee/Hip Exercises: Prone   Hamstring Curl 15 reps                  PT Short Term Goals - 10/26/14 6301    PT SHORT TERM GOAL #1   Title Patient will demonstrate R knee ROM of 0-120  degrees with no pain   Time 3   Period Weeks   Status New   PT SHORT TERM GOAL #2   Title Patient will demonstrate at least 4-/5 strength in R knee and at least 4+/5 strength in L LE    Time 3   Period Weeks   Status New   PT SHORT TERM GOAL #3   Title Patient to be ambulating unlimited distances without crutches and WBAT R LE, equal step lengths, equal weight bearing each side, minimal unsteadiness, pain no more than 1/10   Time 3   Period Weeks   Status New   PT SHORT TERM GOAL #4   Title Patient will experience 0/10 pain R knee  with all functional weight bearing tasks and exercises    Time 3   Period Weeks   Status New   PT SHORT TERM GOAL #5   Title Patient to be independent in consistently and correctly  performing  appropriate HEP, to be updated PRN    Time 3   Period Weeks   Status New           PT Long Term Goals - 10/26/14 0957    PT LONG TERM GOAL #1   Title Patient to have 0 degrees extension  and 130 degrees flexion R knee, pain 0/10   Time 6   Period Weeks   Status New   PT LONG TERM GOAL #2   Title Patient will be able to perform single leg squat to floor with R knee, minimal knee valgus, and pain 0/10   Time 6   Period Weeks   Status New   PT LONG TERM GOAL #3   Title Patient will be able to perform single leg hops of equal distance with bilateral lower extremities and pain 0/10 R knee    Time 6   Period Weeks   Status New   PT LONG TERM GOAL #4   Title Patient to be able to jump off of at least 14 inch box and land on bilateral feet with good technique, good knee positioning with minimal knee valgus, and pain R knee 0/10   Time 6   Period Weeks   Status New   PT LONG TERM GOAL #5   Title Patient to be able to maintain single leg stance on BOSU for at least 30 seconds with mild to moderate perturbations, pain R knee 0/10   Time 6   Period Weeks   Status New   Additional Long Term Goals   Additional Long Term Goals Yes   PT LONG TERM GOAL #6   Title Patient will demonstrate the ability to safely ascend and descend full height ladder and crawl around obstacles with pain R knee 0/10   Time 6   Period Weeks   Status New               Plan - 11/08/14 1805    Clinical Impression Statement Treatment focused on improving R knee strength and ROM. Mini squats to 40 degrees of knee flexion and side lunges were added today, and pt was able to complete these exercises without increased pain. Pt continues to demonstrate weakness of quads with WB exercises, evidenced by trembling of quads with squats and lunges. She is still limited with knee ROM due to c/o tightness and pain.    PT Next Visit Plan Continue with functional strengthening and ROM per protocol, focusing  on achieving 0-105 degrees ROM  Problem List There are no active problems to display for this patient.   Leona Singleton, PT, DPT [phone] 11/08/2014, 6:09 PM  [facility] [address] [place], [place], [address] Phone: [phone]   Fax:  [phone]

## 2014-11-10 ENCOUNTER — Ambulatory Visit (HOSPITAL_COMMUNITY): Payer: Federal, State, Local not specified - PPO | Admitting: Physical Therapy

## 2014-11-10 DIAGNOSIS — M25561 Pain in right knee: Secondary | ICD-10-CM

## 2014-11-10 DIAGNOSIS — R2681 Unsteadiness on feet: Secondary | ICD-10-CM

## 2014-11-10 DIAGNOSIS — Z9889 Other specified postprocedural states: Secondary | ICD-10-CM | POA: Diagnosis not present

## 2014-11-10 DIAGNOSIS — R269 Unspecified abnormalities of gait and mobility: Secondary | ICD-10-CM

## 2014-11-10 DIAGNOSIS — M25661 Stiffness of right knee, not elsewhere classified: Secondary | ICD-10-CM

## 2014-11-10 DIAGNOSIS — R262 Difficulty in walking, not elsewhere classified: Secondary | ICD-10-CM

## 2014-11-10 DIAGNOSIS — R609 Edema, unspecified: Secondary | ICD-10-CM

## 2014-11-10 NOTE — Therapy (Signed)
[facility] [address] [place], [place], [address] Phone: [phone]   Fax:  [phone]  Physical Therapy Treatment  Patient Details  Name: Ariel Parker MRN: [id number] Date of Birth: [birth year] Referring Provider:  Babs Sciara, MD  Encounter Date: 11/10/2014      PT End of Session - 11/10/14 1652    Visit Number 7   Number of Visits 18   Date for PT Re-Evaluation 11/23/14   Authorization Type [employer] (PROGRESS NOTE MUST BE DONE WEEKLY FOR MILITARY, give to patient each Friday)   Authorization Time Period 10/26/14 to 12/26/14   PT Start Time 1605   PT Stop Time 1645   PT Time Calculation (min) 40 min   Activity Tolerance Patient tolerated treatment well   Behavior During Therapy [facility] for tasks assessed/performed      Past Medical History  Diagnosis Date  . Disruption of anterior cruciate ligament of right knee 09/2014    Past Surgical History  Procedure Laterality Date  . Wisdom tooth extraction    . Knee arthroscopy w/ acl reconstruction Left 07/17/2006  . Knee arthroscopy with anterior cruciate ligament (acl) repair with hamstring graft Right 10/14/2014    Procedure: RIGHT KNEE ARTHROSCOPY WITH ANTERIOR CRUCIATE LIGAMENT (ACL) REPAIR;  Surgeon: Mckinley Jewel, MD;  Location: [facility];  Service: Orthopedics;  Laterality: Right;  . Knee arthroscopy with lateral menisectomy Right 10/14/2014    Procedure: KNEE ARTHROSCOPY WITH LATERAL MENISECTOMY;  Surgeon: Mckinley Jewel, MD;  Location: [facility];  Service: Orthopedics;  Laterality: Right;    There were no vitals filed for this visit.  Visit Diagnosis:  S/P ACL repair  Right knee pain  Knee stiffness, right  Unsteadiness  Difficulty walking  Edema  Abnormality of gait      Subjective Assessment - 11/10/14 1608    Subjective Patient does not have any pain today, arrived ambulating with one crutch today.    Pertinent History  Patient was playing soccer, jumped up and landed and heard a pop. ACL surgery was the 21st of July. Things at home have been going OK but at one point MD thought there might have been a blood clot, which did come back negative. Was taken off of CPM due to blood clot concern and to her knowledge is not going to be put back on CPM.    Currently in Pain? No/denies                         [facility] Adult PT Treatment/Exercise - 11/10/14 0001    Knee/Hip Exercises: Stretches   Active Hamstring Stretch 3 reps;30 seconds   Active Hamstring Stretch Limitations 12 inch step   Gastroc Stretch Right;3 reps;30 seconds   Gastroc Stretch Limitations slantboard    Knee/Hip Exercises: Standing   Heel Raises Both;1 set;15 reps   Forward Lunges 1 set;10 reps;Right   Forward Lunges Limitations 6 inch box    Side Lunges 1 set;10 reps;Right   Side Lunges Limitations 8 inch box    Functional Squat 10 reps   Functional Squat Limitations minisquat    Rocker Board Limitations x20 AP, x20 lateral    Gait Training gait training in parallel bars with no HHA x3 laps, WBAT   Other Standing Knee Exercises Lateral and A-P weight shifts 1x20   Knee/Hip Exercises: Supine   Quad Sets 20 reps   Quad Sets Limitations 5 second holds    Short  Arc [employer] Right;1 set;15 reps   Short [employer] Limitations 2 second holds    Straight Leg Raises Both;2 sets;15 reps   Straight Leg Raise with External Rotation Both;2 sets;15 reps   Other Supine Knee/Hip Exercises PROM/AAROM to 80 degrees flexion                PT Education - 11/10/14 1651    Education provided Yes   Education Details educated that stiffness in flexion may be possibly due to still waring immobilizer a lot, edema and some healing still occuring anterior knee    Person(s) Educated Patient;Parent(s)   Methods Explanation   Comprehension Verbalized understanding          PT Short Term Goals - 10/26/14 0948    PT SHORT TERM GOAL  #1   Title Patient will demonstrate R knee ROM of 0-120 degrees with no pain   Time 3   Period Weeks   Status New   PT SHORT TERM GOAL #2   Title Patient will demonstrate at least 4-/5 strength in R knee and at least 4+/5 strength in L LE    Time 3   Period Weeks   Status New   PT SHORT TERM GOAL #3   Title Patient to be ambulating unlimited distances without crutches and WBAT R LE, equal step lengths, equal weight bearing each side, minimal unsteadiness, pain no more than 1/10   Time 3   Period Weeks   Status New   PT SHORT TERM GOAL #4   Title Patient will experience 0/10 pain R knee  with all functional weight bearing tasks and exercises    Time 3   Period Weeks   Status New   PT SHORT TERM GOAL #5   Title Patient to be independent in consistently and correctly performing  appropriate HEP, to be updated PRN    Time 3   Period Weeks   Status New           PT Long Term Goals - 10/26/14 0957    PT LONG TERM GOAL #1   Title Patient to have 0 degrees extension  and 130 degrees flexion R knee, pain 0/10   Time 6   Period Weeks   Status New   PT LONG TERM GOAL #2   Title Patient will be able to perform single leg squat to floor with R knee, minimal knee valgus, and pain 0/10   Time 6   Period Weeks   Status New   PT LONG TERM GOAL #3   Title Patient will be able to perform single leg hops of equal distance with bilateral lower extremities and pain 0/10 R knee    Time 6   Period Weeks   Status New   PT LONG TERM GOAL #4   Title Patient to be able to jump off of at least 14 inch box and land on bilateral feet with good technique, good knee positioning with minimal knee valgus, and pain R knee 0/10   Time 6   Period Weeks   Status New   PT LONG TERM GOAL #5   Title Patient to be able to maintain single leg stance on BOSU for at least 30 seconds with mild to moderate perturbations, pain R knee 0/10   Time 6   Period Weeks   Status New   Additional Long Term Goals    Additional Long Term Goals Yes   PT LONG TERM GOAL #6  Title Patient will demonstrate the ability to safely ascend and descend full height ladder and crawl around obstacles with pain R knee 0/10   Time 6   Period Weeks   Status New               Plan - 11/10/14 1653    Clinical Impression Statement Continued focus on improving R knee strength and ROM; all exercises performed without increased pain today however only able to reach 70 degrees flexion today due to tightness and pain. Introdcued gait without assistive device in parallel bars, which was limited by R LE fatigue today but no pain.    Pt will benefit from skilled therapeutic intervention in order to improve on the following deficits Abnormal gait;Decreased coordination;Decreased range of motion;Difficulty walking;Impaired tone;Decreased safety awareness;Decreased activity tolerance;Pain;Decreased balance;Impaired flexibility;Improper body mechanics;Decreased mobility;Decreased strength;Increased edema;Postural dysfunction   Rehab Potential Excellent   PT Frequency 3x / week   PT Duration 6 weeks   PT Treatment/Interventions ADLs/Self Care Home Management;Cryotherapy;Electrical Stimulation;DME Instruction;Gait training;Stair training;Functional mobility training;Therapeutic activities;Therapeutic exercise;Balance training;Neuromuscular re-education;Patient/family education;Manual techniques;Passive range of motion   PT Next Visit Plan Continue with functional strengthening and ROM per protocol, focusing on achieving 0-105 degrees ROM   PT Home Exercise Plan global SLR and SAQ given today   Consulted and Agree with Plan of Care Patient        Problem List There are no active problems to display for this patient.   Nedra Hai PT, DPT [phone]  [facility] [address] [place], [place], [address] Phone: [phone]   Fax:  [phone]

## 2014-11-12 ENCOUNTER — Encounter (HOSPITAL_COMMUNITY): Admitting: Physical Therapy

## 2014-11-15 ENCOUNTER — Ambulatory Visit (HOSPITAL_COMMUNITY): Payer: Federal, State, Local not specified - PPO | Admitting: Physical Therapy

## 2014-11-15 DIAGNOSIS — R269 Unspecified abnormalities of gait and mobility: Secondary | ICD-10-CM

## 2014-11-15 DIAGNOSIS — Z9889 Other specified postprocedural states: Secondary | ICD-10-CM | POA: Diagnosis not present

## 2014-11-15 DIAGNOSIS — R262 Difficulty in walking, not elsewhere classified: Secondary | ICD-10-CM

## 2014-11-15 DIAGNOSIS — R609 Edema, unspecified: Secondary | ICD-10-CM

## 2014-11-15 DIAGNOSIS — M25561 Pain in right knee: Secondary | ICD-10-CM

## 2014-11-15 DIAGNOSIS — R2681 Unsteadiness on feet: Secondary | ICD-10-CM

## 2014-11-15 DIAGNOSIS — M25661 Stiffness of right knee, not elsewhere classified: Secondary | ICD-10-CM

## 2014-11-15 NOTE — Patient Instructions (Signed)
   Prone Quadriceps Stretch  Lie on your stomach; hook a strap around your foot/ankle.  Gently pull your heel towards your bottom until a stretch is felt in the front of the thigh- DO NOT go to the point of pain, as we just want to target the quad.  Keep your back relaxed and your hips flat on the table. Hold for 30-60 seconds, repeat 5 times, 2 times a day.  YOU CAN ALSO:  1) Massage the quad muscle just using your fingers to help it loosen up 2) Put moist heat pack on JUST the quad (not on any other place on the knee, as this might actually increase your swelling) with a layer of towels in between the heat pack and your skin. Leave it there for 10-15 minutes at a time only and check your skin to make sure you are not leaving a red mark that does not fade after the heat pack is removed.

## 2014-11-15 NOTE — Therapy (Signed)
[facility] [address] [place], [place], [address] Phone: [phone]   Fax:  [phone]  Physical Therapy Treatment  Patient Details  Name: Ariel Parker MRN: [id number] Date of Birth: 06-Nov-1990 Referring Provider:  Loreta Ave, MD  Encounter Date: 11/15/2014      PT End of Session - 11/15/14 1654    Visit Number 8   Number of Visits 18   Date for PT Re-Evaluation 11/23/14   Authorization Type [employer] (PROGRESS NOTE MUST BE DONE WEEKLY FOR MILITARY, give to patient each Friday)   Authorization Time Period 10/26/14 to 12/26/14   PT Start Time 1602   PT Stop Time 1647   PT Time Calculation (min) 45 min   Activity Tolerance Patient tolerated treatment well   Behavior During Therapy [facility] for tasks assessed/performed      Past Medical History  Diagnosis Date  . Disruption of anterior cruciate ligament of right knee 09/2014    Past Surgical History  Procedure Laterality Date  . Wisdom tooth extraction    . Knee arthroscopy w/ acl reconstruction Left 07/17/2006  . Knee arthroscopy with anterior cruciate ligament (acl) repair with hamstring graft Right 10/14/2014    Procedure: RIGHT KNEE ARTHROSCOPY WITH ANTERIOR CRUCIATE LIGAMENT (ACL) REPAIR;  Surgeon: Mckinley Jewel, MD;  Location: [facility];  Service: Orthopedics;  Laterality: Right;  . Knee arthroscopy with lateral menisectomy Right 10/14/2014    Procedure: KNEE ARTHROSCOPY WITH LATERAL MENISECTOMY;  Surgeon: Mckinley Jewel, MD;  Location: [facility];  Service: Orthopedics;  Laterality: Right;    There were no vitals filed for this visit.  Visit Diagnosis:  S/P ACL repair  Right knee pain  Knee stiffness, right  Unsteadiness  Difficulty walking  Edema  Abnormality of gait      Subjective Assessment - 11/15/14 1604    Subjective Patient continues to report that she is having difficulty bending her knee still, and it is painful to  try to bend more after a certain point. MD appointment on the 30th.   Pertinent History Patient was playing soccer, jumped up and landed and heard a pop. ACL surgery was the 21st of July. Things at home have been going OK but at one point MD thought there might have been a blood clot, which did come back negative. Was taken off of CPM due to blood clot concern and to her knowledge is not going to be put back on CPM.    Currently in Pain? No/denies            [facility] PT Assessment - 11/15/14 0001    Observation/Other Assessments   Observations very tight quad R with prone stretch, tight area noted distal quad R, possibly indicating somewhat of quad contracutre                      [facility] Adult PT Treatment/Exercise - 11/15/14 0001    Ambulation/Gait   Ambulation/Gait Yes   Ambulation/Gait Assistance 7: Independent   Ambulation Distance (Feet) 450 Feet   Assistive device None   Gait Pattern Step-through pattern;Decreased stance time - right;Decreased step length - left;Right flexed knee in stance;Decreased trunk rotation;Trunk flexed   Gait Comments gait WBAT with no assistive device today, no increased pain during ambulation this afternoon, cues for heel-toe pattern, posture, sequencing during gait    Knee/Hip Exercises: Stretches   Active Hamstring Stretch 3 reps;30 seconds   Active Hamstring Stretch Limitations 12 inch step  Quad Stretch 5 reps;30 seconds   Quad Stretch Limitations mild overpressure from PT to tolerance    Theme park manager Both;3 reps;30 seconds   Gastroc Stretch Limitations slantboard    Knee/Hip Exercises: Supine   Quad Sets 20 reps   Quad Sets Limitations 5 second holds    Straight Leg Raises Both;2 sets;15 reps   Straight Leg Raise with External Rotation Both;2 sets;15 reps                PT Education - 11/15/14 1650    Education provided Yes   Education Details educated patient regarding possible quad contracture that could be limiting  her flexion ROM, strategies to address at home    Person(s) Educated Patient   Methods Explanation   Comprehension Verbalized understanding          PT Short Term Goals - 10/26/14 0948    PT SHORT TERM GOAL #1   Title Patient will demonstrate R knee ROM of 0-120 degrees with no pain   Time 3   Period Weeks   Status New   PT SHORT TERM GOAL #2   Title Patient will demonstrate at least 4-/5 strength in R knee and at least 4+/5 strength in L LE    Time 3   Period Weeks   Status New   PT SHORT TERM GOAL #3   Title Patient to be ambulating unlimited distances without crutches and WBAT R LE, equal step lengths, equal weight bearing each side, minimal unsteadiness, pain no more than 1/10   Time 3   Period Weeks   Status New   PT SHORT TERM GOAL #4   Title Patient will experience 0/10 pain R knee  with all functional weight bearing tasks and exercises    Time 3   Period Weeks   Status New   PT SHORT TERM GOAL #5   Title Patient to be independent in consistently and correctly performing  appropriate HEP, to be updated PRN    Time 3   Period Weeks   Status New           PT Long Term Goals - 10/26/14 0957    PT LONG TERM GOAL #1   Title Patient to have 0 degrees extension  and 130 degrees flexion R knee, pain 0/10   Time 6   Period Weeks   Status New   PT LONG TERM GOAL #2   Title Patient will be able to perform single leg squat to floor with R knee, minimal knee valgus, and pain 0/10   Time 6   Period Weeks   Status New   PT LONG TERM GOAL #3   Title Patient will be able to perform single leg hops of equal distance with bilateral lower extremities and pain 0/10 R knee    Time 6   Period Weeks   Status New   PT LONG TERM GOAL #4   Title Patient to be able to jump off of at least 14 inch box and land on bilateral feet with good technique, good knee positioning with minimal knee valgus, and pain R knee 0/10   Time 6   Period Weeks   Status New   PT LONG TERM GOAL #5    Title Patient to be able to maintain single leg stance on BOSU for at least 30 seconds with mild to moderate perturbations, pain R knee 0/10   Time 6   Period Weeks   Status New   Additional  Long Term Goals   Additional Long Term Goals Yes   PT LONG TERM GOAL #6   Title Patient will demonstrate the ability to safely ascend and descend full height ladder and crawl around obstacles with pain R knee 0/10   Time 6   Period Weeks   Status New               Plan - 11/15/14 1655    Clinical Impression Statement Focused on identifying cause of deficiits in flexion at this time; did identify significant tight area in distal quad as well as muscle stretching in prone with knee flexion, indicating possible quad contracture given that patient did spend almost 2 weeks in immobilizer with knee straight and not moving LE due to concern of blood clot at the time. Otherwise focused on gait WBAT without assistive device; able to ambulate 466ft today with cues for sequencing and technique. Advised patient to perform prone quad stretch to tolerance at home, as well as massage of distal quad and moist heat to assit in loosening muscle.    Pt will benefit from skilled therapeutic intervention in order to improve on the following deficits Abnormal gait;Decreased coordination;Decreased range of motion;Difficulty walking;Impaired tone;Decreased safety awareness;Decreased activity tolerance;Pain;Decreased balance;Impaired flexibility;Improper body mechanics;Decreased mobility;Decreased strength;Increased edema;Postural dysfunction   Rehab Potential Excellent   PT Frequency 3x / week   PT Duration 6 weeks   PT Treatment/Interventions ADLs/Self Care Home Management;Cryotherapy;Electrical Stimulation;DME Instruction;Gait training;Stair training;Functional mobility training;Therapeutic activities;Therapeutic exercise;Balance training;Neuromuscular re-education;Patient/family education;Manual techniques;Passive range  of motion   PT Next Visit Plan continue to address quad tightness to assist with ROM, otherwise follow protocol for gait and exercise    PT Home Exercise Plan global SLR and SAQ given today   Consulted and Agree with Plan of Care Patient        Problem List There are no active problems to display for this patient.   Nedra Hai PT, DPT [phone]  [facility] [address] [place], [place], [address] Phone: [phone]   Fax:  [phone]

## 2014-11-17 ENCOUNTER — Ambulatory Visit (HOSPITAL_COMMUNITY): Payer: Federal, State, Local not specified - PPO

## 2014-11-17 DIAGNOSIS — M25661 Stiffness of right knee, not elsewhere classified: Secondary | ICD-10-CM

## 2014-11-17 DIAGNOSIS — R2681 Unsteadiness on feet: Secondary | ICD-10-CM

## 2014-11-17 DIAGNOSIS — Z9889 Other specified postprocedural states: Secondary | ICD-10-CM

## 2014-11-17 DIAGNOSIS — M25561 Pain in right knee: Secondary | ICD-10-CM

## 2014-11-17 DIAGNOSIS — R262 Difficulty in walking, not elsewhere classified: Secondary | ICD-10-CM

## 2014-11-17 DIAGNOSIS — R609 Edema, unspecified: Secondary | ICD-10-CM

## 2014-11-17 DIAGNOSIS — R269 Unspecified abnormalities of gait and mobility: Secondary | ICD-10-CM

## 2014-11-17 NOTE — Therapy (Signed)
[facility] [address] [place], [place], [address] Phone: [phone]   Fax:  [phone]  Physical Therapy Treatment  Patient Details  Name: Ariel Parker MRN: [id number] Date of Birth: 07/23/1990 Referring Provider:  Babs Sciara, MD  Encounter Date: 11/17/2014      PT End of Session - 11/17/14 1712    Visit Number 9   Number of Visits 18   Date for PT Re-Evaluation 11/23/14   Authorization Type [employer] (PROGRESS NOTE MUST BE DONE WEEKLY FOR MILITARY, give to patient each Friday)   Authorization Time Period 10/26/14 to 12/26/14   PT Start Time 1605   PT Stop Time 1658   PT Time Calculation (min) 53 min   Activity Tolerance Patient tolerated treatment well   Behavior During Therapy [facility] for tasks assessed/performed      Past Medical History  Diagnosis Date  . Disruption of anterior cruciate ligament of right knee 09/2014    Past Surgical History  Procedure Laterality Date  . Wisdom tooth extraction    . Knee arthroscopy w/ acl reconstruction Left 07/17/2006  . Knee arthroscopy with anterior cruciate ligament (acl) repair with hamstring graft Right 10/14/2014    Procedure: RIGHT KNEE ARTHROSCOPY WITH ANTERIOR CRUCIATE LIGAMENT (ACL) REPAIR;  Surgeon: Mckinley Jewel, MD;  Location: [facility];  Service: Orthopedics;  Laterality: Right;  . Knee arthroscopy with lateral menisectomy Right 10/14/2014    Procedure: KNEE ARTHROSCOPY WITH LATERAL MENISECTOMY;  Surgeon: Mckinley Jewel, MD;  Location: [facility];  Service: Orthopedics;  Laterality: Right;    There were no vitals filed for this visit.  Visit Diagnosis:  S/P ACL repair  Right knee pain  Knee stiffness, right  Unsteadiness  Difficulty walking  Edema  Abnormality of gait      Subjective Assessment - 11/17/14 1708    Subjective Pt stated she is pain free today, compliant with HEP daily.  Continued to have difficutly bending knee,  stateing "the knee feels like it's catching" while pointing to anterior knee.  MD apt scheduled for 11/23/2014   Currently in Pain? No/denies            [facility] Adult PT Treatment/Exercise - 11/17/14 0001    Exercises   Exercises Knee/Hip   Knee/Hip Exercises: Stretches   Active Hamstring Stretch 3 reps;30 seconds   Active Hamstring Stretch Limitations supine with rope   Quad Stretch 4 reps;30 seconds   Quad Stretch Limitations prone with rope   Gastroc Stretch Both;3 reps;30 seconds   Gastroc Stretch Limitations slantboard    Knee/Hip Exercises: Standing   Heel Raises Both;1 set;15 reps   Heel Raises Limitations toeraises 15 reps   Knee Flexion Right;15 reps   Terminal Knee Extension Right;10 reps   Terminal Knee Extension Limitations Quad sets prior toe raise for knee extension   Functional Squat 10 reps   Functional Squat Limitations minisquat    Knee/Hip Exercises: Supine   Quad Sets 20 reps   Heel Slides 15 reps   Heel Slides Limitations 85 degrees max   Knee Flexion AAROM;10 reps   Knee Flexion Limitations AROM 85 degrees   Knee/Hip Exercises: Prone   Hamstring Curl 15 reps   Other Prone Exercises AAROM 10x 5"   Manual Therapy   Manual Therapy Soft tissue mobilization   Soft tissue mobilization soft tissue massage to quads to reduce spasms distal quad, scar tissue massage over incision  PT Short Term Goals - 10/26/14 0948    PT SHORT TERM GOAL #1   Title Patient will demonstrate R knee ROM of 0-120 degrees with no pain   Time 3   Period Weeks   Status New   PT SHORT TERM GOAL #2   Title Patient will demonstrate at least 4-/5 strength in R knee and at least 4+/5 strength in L LE    Time 3   Period Weeks   Status New   PT SHORT TERM GOAL #3   Title Patient to be ambulating unlimited distances without crutches and WBAT R LE, equal step lengths, equal weight bearing each side, minimal unsteadiness, pain no more than 1/10   Time 3   Period Weeks    Status New   PT SHORT TERM GOAL #4   Title Patient will experience 0/10 pain R knee  with all functional weight bearing tasks and exercises    Time 3   Period Weeks   Status New   PT SHORT TERM GOAL #5   Title Patient to be independent in consistently and correctly performing  appropriate HEP, to be updated PRN    Time 3   Period Weeks   Status New           PT Long Term Goals - 10/26/14 0957    PT LONG TERM GOAL #1   Title Patient to have 0 degrees extension  and 130 degrees flexion R knee, pain 0/10   Time 6   Period Weeks   Status New   PT LONG TERM GOAL #2   Title Patient will be able to perform single leg squat to floor with R knee, minimal knee valgus, and pain 0/10   Time 6   Period Weeks   Status New   PT LONG TERM GOAL #3   Title Patient will be able to perform single leg hops of equal distance with bilateral lower extremities and pain 0/10 R knee    Time 6   Period Weeks   Status New   PT LONG TERM GOAL #4   Title Patient to be able to jump off of at least 14 inch box and land on bilateral feet with good technique, good knee positioning with minimal knee valgus, and pain R knee 0/10   Time 6   Period Weeks   Status New   PT LONG TERM GOAL #5   Title Patient to be able to maintain single leg stance on BOSU for at least 30 seconds with mild to moderate perturbations, pain R knee 0/10   Time 6   Period Weeks   Status New   Additional Long Term Goals   Additional Long Term Goals Yes   PT LONG TERM GOAL #6   Title Patient will demonstrate the ability to safely ascend and descend full height ladder and crawl around obstacles with pain R knee 0/10   Time 6   Period Weeks   Status New               Plan - 11/17/14 1713    Clinical Impression Statement Session focus on improving Rt knee AROM and quad/hamstring strenghtening.  Began session wth manual soft tissue mobalizatiion techniques to reduce distal quad contracture and proximal quad tightnes.  Noted  improved ability to flex knee AROM 85 degrees following manual.  Pt continues to state "knee feels likes it's catching" while pointing towards anterior knee.  Good patella mobilty with no noted adhesions nor  reports of pain.  Pt able to demonstrate approraite form and technique with all exercises today with min cueing required, all exercises were complete out of the brace.   PT Next Visit Plan continue to address quad tightness to assist with ROM, otherwise follow protocol for gait and exercise         Problem List There are no active problems to display for this patient.  [address], LPTA; CBIS [phone]  Juel Burrow 11/17/2014, 5:21 PM  [facility] [address] [place], [place], [address] Phone: [phone]   Fax:  [phone]

## 2014-11-19 ENCOUNTER — Ambulatory Visit (HOSPITAL_COMMUNITY): Payer: Federal, State, Local not specified - PPO | Admitting: Physical Therapy

## 2014-11-19 DIAGNOSIS — Z9889 Other specified postprocedural states: Secondary | ICD-10-CM

## 2014-11-19 DIAGNOSIS — R2681 Unsteadiness on feet: Secondary | ICD-10-CM

## 2014-11-19 DIAGNOSIS — M25661 Stiffness of right knee, not elsewhere classified: Secondary | ICD-10-CM

## 2014-11-19 DIAGNOSIS — M25561 Pain in right knee: Secondary | ICD-10-CM

## 2014-11-19 NOTE — Therapy (Signed)
[place] [facility] [address] [place], [place], [address] Phone: [phone]   Fax:  [phone]  Physical Therapy Treatment  Patient Details  Name: Ariel Parker MRN: [id number] Date of Birth: 1990-05-01 Referring Provider:  Babs Sciara, MD  Encounter Date: 11/19/2014      PT End of Session - 11/19/14 1614    Visit Number 10   Number of Visits 18   Date for PT Re-Evaluation 11/23/14   Authorization Type [employer] (PROGRESS NOTE MUST BE DONE WEEKLY FOR MILITARY, give to patient each Friday)   Authorization Time Period 10/26/14 to 12/26/14   PT Start Time 1515   PT Stop Time 1605   PT Time Calculation (min) 50 min   Activity Tolerance Patient tolerated treatment well   Behavior During Therapy [facility] for tasks assessed/performed      Past Medical History  Diagnosis Date  . Disruption of anterior cruciate ligament of right knee 09/2014    Past Surgical History  Procedure Laterality Date  . Wisdom tooth extraction    . Knee arthroscopy w/ acl reconstruction Left 07/17/2006  . Knee arthroscopy with anterior cruciate ligament (acl) repair with hamstring graft Right 10/14/2014    Procedure: RIGHT KNEE ARTHROSCOPY WITH ANTERIOR CRUCIATE LIGAMENT (ACL) REPAIR;  Surgeon: Mckinley Jewel, MD;  Location: [facility];  Service: Orthopedics;  Laterality: Right;  . Knee arthroscopy with lateral menisectomy Right 10/14/2014    Procedure: KNEE ARTHROSCOPY WITH LATERAL MENISECTOMY;  Surgeon: Mckinley Jewel, MD;  Location: [facility];  Service: Orthopedics;  Laterality: Right;    There were no vitals filed for this visit.  Visit Diagnosis:  S/P ACL repair  Right knee pain  Knee stiffness, right  Unsteadiness      Subjective Assessment - 11/19/14 1536    Subjective Pt reports that she is pain free today, she has been walking without her crutch in her house, but does not trust her leg enough yet to walk in the community  without it.    Currently in Pain? No/denies   Pain Score 0-No pain                         [facility] Adult PT Treatment/Exercise - 11/19/14 0001    Ambulation/Gait   Ambulation/Gait Yes   Ambulation/Gait Assistance 7: Independent   Ambulation Distance (Feet) 225 Feet   Assistive device None   Gait Pattern Step-through pattern;Decreased stance time - right;Decreased step length - left;Right flexed knee in stance;Decreased trunk rotation;Trunk flexed   Gait Comments minimal cueing for heel-toe   Knee/Hip Exercises: Stretches   Active Hamstring Stretch 3 reps;30 seconds   Active Hamstring Stretch Limitations 12 inch step   Quad Stretch 4 reps;30 seconds   Quad Stretch Limitations prone with rope   Knee: Self-Stretch to increase Flexion 10 seconds;3 reps   Knee: Self-Stretch Limitations 12" step   Gastroc Stretch Both;3 reps;30 seconds   Gastroc Stretch Limitations slantboard    Knee/Hip Exercises: Standing   Heel Raises Both;1 set;15 reps   Heel Raises Limitations toeraises 15 reps   Terminal Knee Extension Right;15 reps   Terminal Knee Extension Limitations green tband   Lateral Step Up 10 reps;Step Height: 4"   Forward Step Up 10 reps;Step Height: 4"   Functional Squat 15 reps   Functional Squat Limitations minisquat    Other Standing Knee Exercises sidestepping with green tband x 2 RT   Knee/Hip Exercises: Supine  Heel Slides Limitations 92   Straight Leg Raises 15 reps   Knee/Hip Exercises: Prone   Other Prone Exercises PROM: contract relax stretching for knee flexion   Manual Therapy   Manual Therapy Soft tissue mobilization   Soft tissue mobilization soft tissue massage, muscle play to quads to reduce spasms distal quad, scar tissue massage over incision                PT Education - 11/19/14 1610    Education provided Yes   Education Details HEP for sidestepping with green tband, knee drives, TKE with band          PT Short Term Goals -  10/26/14 0948    PT SHORT TERM GOAL #1   Title Patient will demonstrate R knee ROM of 0-120 degrees with no pain   Time 3   Period Weeks   Status New   PT SHORT TERM GOAL #2   Title Patient will demonstrate at least 4-/5 strength in R knee and at least 4+/5 strength in L LE    Time 3   Period Weeks   Status New   PT SHORT TERM GOAL #3   Title Patient to be ambulating unlimited distances without crutches and WBAT R LE, equal step lengths, equal weight bearing each side, minimal unsteadiness, pain no more than 1/10   Time 3   Period Weeks   Status New   PT SHORT TERM GOAL #4   Title Patient will experience 0/10 pain R knee  with all functional weight bearing tasks and exercises    Time 3   Period Weeks   Status New   PT SHORT TERM GOAL #5   Title Patient to be independent in consistently and correctly performing  appropriate HEP, to be updated PRN    Time 3   Period Weeks   Status New           PT Long Term Goals - 10/26/14 0957    PT LONG TERM GOAL #1   Title Patient to have 0 degrees extension  and 130 degrees flexion R knee, pain 0/10   Time 6   Period Weeks   Status New   PT LONG TERM GOAL #2   Title Patient will be able to perform single leg squat to floor with R knee, minimal knee valgus, and pain 0/10   Time 6   Period Weeks   Status New   PT LONG TERM GOAL #3   Title Patient will be able to perform single leg hops of equal distance with bilateral lower extremities and pain 0/10 R knee    Time 6   Period Weeks   Status New   PT LONG TERM GOAL #4   Title Patient to be able to jump off of at least 14 inch box and land on bilateral feet with good technique, good knee positioning with minimal knee valgus, and pain R knee 0/10   Time 6   Period Weeks   Status New   PT LONG TERM GOAL #5   Title Patient to be able to maintain single leg stance on BOSU for at least 30 seconds with mild to moderate perturbations, pain R knee 0/10   Time 6   Period Weeks   Status  New   Additional Long Term Goals   Additional Long Term Goals Yes   PT LONG TERM GOAL #6   Title Patient will demonstrate the ability to safely ascend and descend full  height ladder and crawl around obstacles with pain R knee 0/10   Time 6   Period Weeks   Status New               Plan - 11/19/14 1614    Clinical Impression Statement Pt demonstrates significant tightness in distal quads that is limiting her ability to achieve full knee flexion. Session began with manual therapy to distal quads with soft tissue mobilization, muscle play, and scar tissue massage, followed by prone quad stretch. PT completed contract-relax stretching with pt before taking ROM measurement, pt was able to achieve 92 degrees of flexion following manual therapy. Step ups forward and laterally were added today, and pt was able to complete without pain. Gait training concluded session with pt ambulating without AD and with minimal cueing needed for proper gait mechanics.    PT Next Visit Plan Continue with manual therapy to quads, perform contract-relax stretching, continue with step ups, lunges and other WB exercises per protocol        Problem List There are no active problems to display for this patient.   Leona Singleton, PT, DPT [phone] 11/19/2014, 4:22 PM  [place] [address] [place], [place], [address] Phone: [phone]   Fax:  [phone]

## 2014-11-22 ENCOUNTER — Telehealth (HOSPITAL_COMMUNITY): Payer: Self-pay

## 2014-11-22 ENCOUNTER — Ambulatory Visit (HOSPITAL_COMMUNITY): Payer: Federal, State, Local not specified - PPO

## 2014-11-22 DIAGNOSIS — M25661 Stiffness of right knee, not elsewhere classified: Secondary | ICD-10-CM

## 2014-11-22 DIAGNOSIS — R2681 Unsteadiness on feet: Secondary | ICD-10-CM

## 2014-11-22 DIAGNOSIS — R262 Difficulty in walking, not elsewhere classified: Secondary | ICD-10-CM

## 2014-11-22 DIAGNOSIS — Z9889 Other specified postprocedural states: Secondary | ICD-10-CM

## 2014-11-22 DIAGNOSIS — R609 Edema, unspecified: Secondary | ICD-10-CM

## 2014-11-22 DIAGNOSIS — M25561 Pain in right knee: Secondary | ICD-10-CM

## 2014-11-22 DIAGNOSIS — R269 Unspecified abnormalities of gait and mobility: Secondary | ICD-10-CM

## 2014-11-22 NOTE — Therapy (Signed)
[facility] [address] [place], [place], [address] Phone: [phone]   Fax:  [phone]  Physical Therapy Treatment  Patient Details  Name: Ariel Parker MRN: [id number] Date of Birth: 1991/03/19 Referring Provider:  Loreta Ave, MD  Encounter Date: 11/22/2014      PT End of Session - 11/22/14 1834    Visit Number 11   Number of Visits 18   Date for PT Re-Evaluation 11/23/14   Authorization Type [employer] (PROGRESS NOTE MUST BE DONE WEEKLY FOR MILITARY, give to patient each Friday)   Authorization Time Period 10/26/14 to 12/26/14   PT Start Time 1736   PT Stop Time 1830   PT Time Calculation (min) 54 min   Activity Tolerance Patient tolerated treatment well   Behavior During Therapy [facility] for tasks assessed/performed      Past Medical History  Diagnosis Date  . Disruption of anterior cruciate ligament of right knee 09/2014    Past Surgical History  Procedure Laterality Date  . Wisdom tooth extraction    . Knee arthroscopy w/ acl reconstruction Left 07/17/2006  . Knee arthroscopy with anterior cruciate ligament (acl) repair with hamstring graft Right 10/14/2014    Procedure: RIGHT KNEE ARTHROSCOPY WITH ANTERIOR CRUCIATE LIGAMENT (ACL) REPAIR;  Surgeon: Mckinley Jewel, MD;  Location: [facility];  Service: Orthopedics;  Laterality: Right;  . Knee arthroscopy with lateral menisectomy Right 10/14/2014    Procedure: KNEE ARTHROSCOPY WITH LATERAL MENISECTOMY;  Surgeon: Mckinley Jewel, MD;  Location: [facility];  Service: Orthopedics;  Laterality: Right;    There were no vitals filed for this visit.  Visit Diagnosis:  S/P ACL repair  Right knee pain  Knee stiffness, right  Unsteadiness  Difficulty walking  Edema  Abnormality of gait      Subjective Assessment - 11/22/14 1816    Subjective Pt continues to state she has increased difficulty bending knee is very stiff today.  Pt continues to  ambulate with 1 crutch in community and without AD at home.     How long can you sit comfortably? Able to sit comfortably for hours with stiffness noted (Sitting in standard chair about 30 minutes)   How long can you stand comfortably? Able to stand for couple of hours (15 minutes with walker)   How long can you walk comfortably? Able to walk for 1 hour with 1 crutch (5 minutes with walker and crutches )   Currently in Pain? No/denies             [facility] Adult PT Treatment/Exercise - 11/22/14 0001    Exercises   Exercises Knee/Hip   Knee/Hip Exercises: Stretches   Active Hamstring Stretch 3 reps;30 seconds   Active Hamstring Stretch Limitations 12 inch step   Quad Stretch 4 reps;30 seconds   Quad Stretch Limitations prone with rope   Knee: Self-Stretch to increase Flexion 10 seconds;3 reps   Knee: Self-Stretch Limitations 12" step   Gastroc Stretch Both;3 reps;30 seconds   Gastroc Stretch Limitations slantboard    Knee/Hip Exercises: Standing   Heel Raises Both;1 set;15 reps   Heel Raises Limitations toeraises 15 reps   Knee Flexion Right;15 reps   Lateral Step Up Hand Hold: 2;Step Height: 4";10 reps   Forward Step Up 10 reps;Step Height: 4"   Functional Squat 15 reps   Functional Squat Limitations minisquat    Knee/Hip Exercises: Supine   Quad Sets 20 reps   Heel Slides 15 reps  Heel Slides Limitations 92   Knee Flexion Limitations AROM 94 degrees   Other Supine Knee/Hip Exercises PROM/AAROM to 94 degrees flexion   Knee/Hip Exercises: Prone   Contract/Relax to Increase Flexion 10x   Manual Therapy   Manual Therapy Soft tissue mobilization   Soft tissue mobilization soft tissue massage, muscle play to quads to reduce spasms distal quad, scar tissue massage over incision           PT Short Term Goals - 11/22/14 1819    PT SHORT TERM GOAL #1   Title Patient will demonstrate R knee ROM of 0-120 degrees with no pain   PT SHORT TERM GOAL #2   Title Patient will  demonstrate at least 4-/5 strength in R knee and at least 4+/5 strength in L LE    Status On-going   PT SHORT TERM GOAL #3   Title Patient to be ambulating unlimited distances without crutches and WBAT R LE, equal step lengths, equal weight bearing each side, minimal unsteadiness, pain no more than 1/10   Status On-going   PT SHORT TERM GOAL #4   Title Patient will experience 0/10 pain R knee  with all functional weight bearing tasks and exercises    Status On-going   PT SHORT TERM GOAL #5   Title Patient to be independent in consistently and correctly performing  appropriate HEP, to be updated PRN    Baseline 08/29/216:  Reports compliance daily   Status Achieved           PT Long Term Goals - 11/22/14 1822    PT LONG TERM GOAL #1   Title Patient to have 0 degrees extension  and 130 degrees flexion R knee, pain 0/10   PT LONG TERM GOAL #2   Title Patient will be able to perform single leg squat to floor with R knee, minimal knee valgus, and pain 0/10   PT LONG TERM GOAL #3   Title Patient will be able to perform single leg hops of equal distance with bilateral lower extremities and pain 0/10 R knee    PT LONG TERM GOAL #4   Title Patient to be able to jump off of at least 14 inch box and land on bilateral feet with good technique, good knee positioning with minimal knee valgus, and pain R knee 0/10   PT LONG TERM GOAL #5   Title Patient to be able to maintain single leg stance on BOSU for at least 30 seconds with mild to moderate perturbations, pain R knee 0/10   PT LONG TERM GOAL #6   Title Patient will demonstrate the ability to safely ascend and descend full height ladder and crawl around obstacles with pain R knee 0/10               Plan - 11/22/14 1834    Clinical Impression Statement Pt continues to be limited with knee flexion stating she feels it catching when she gets to approximately 90 degrees.  Session focus on soft tissue mobalization techniques to reduce  overall tightness in quadricep musculature and to reduce scar tissue adhesinos.  Good results following contract relax techniques with ability to bend knee to AROM 94 degrees though difficutly.  Continues with functional strengthening exercises per protocol.  Min cueing for knee flexion and to reduce circuductino with gait.  No reoprts of pain through session.  Pt with MD apt tomorrow, encouraged to f/u wtih MD with the "catching" limiting flexion.     PT  Next Visit Plan F/U with MD.  Continue with manual therapy to quads, perform contract-relax stretching, continue with step ups, lunges and other WB exercises per protocol        Problem List There are no active problems to display for this patient.  Becky Sax, LPTA; CBIS [phone]  Juel Burrow 11/22/2014, 6:43 PM   Physical Therapy Progress Note  Dates of Reporting Period: 10/26/14 to 11/22/14  Objective Reports of Subjective Statement: continues to be limited by feelings of knee catching in flexion, knee flexion limitations  Objective Measurements: see above   Goal Update: see above   Plan: see above   Reason Skilled Services are Required: focus on optimizing knee ROM, muscle strength, functional knee stability, ability to safely participate in dynamic tasks, reduce chances of re-injury   Nedra Hai PT, DPT [phone]     [facility] [address] [place], [place], [address] Phone: [phone]   Fax:  [phone]

## 2014-11-24 ENCOUNTER — Ambulatory Visit (HOSPITAL_COMMUNITY): Payer: Federal, State, Local not specified - PPO

## 2014-11-24 DIAGNOSIS — M25561 Pain in right knee: Secondary | ICD-10-CM

## 2014-11-24 DIAGNOSIS — R2681 Unsteadiness on feet: Secondary | ICD-10-CM

## 2014-11-24 DIAGNOSIS — Z9889 Other specified postprocedural states: Secondary | ICD-10-CM

## 2014-11-24 DIAGNOSIS — R609 Edema, unspecified: Secondary | ICD-10-CM

## 2014-11-24 DIAGNOSIS — R262 Difficulty in walking, not elsewhere classified: Secondary | ICD-10-CM

## 2014-11-24 DIAGNOSIS — R269 Unspecified abnormalities of gait and mobility: Secondary | ICD-10-CM

## 2014-11-24 DIAGNOSIS — M25661 Stiffness of right knee, not elsewhere classified: Secondary | ICD-10-CM

## 2014-11-24 NOTE — Therapy (Signed)
[facility] [address] [place], [place], [address] Phone: [phone]   Fax:  [phone]  Physical Therapy Treatment  Patient Details  Name: Ariel Parker MRN: [id number] Date of Birth: 10-31-1990 Referring Provider:  Babs Sciara, MD  Encounter Date: 11/24/2014      PT End of Session - 11/24/14 1746    Visit Number 12   Number of Visits 18   Date for PT Re-Evaluation 11/23/14   Authorization Type [employer] (PROGRESS NOTE MUST BE DONE WEEKLY FOR MILITARY, give to patient each Friday)   Authorization Time Period 10/26/14 to 12/26/14   PT Start Time 1650   PT Stop Time 1740   PT Time Calculation (min) 50 min   Activity Tolerance Patient tolerated treatment well   Behavior During Therapy [facility] for tasks assessed/performed      Past Medical History  Diagnosis Date  . Disruption of anterior cruciate ligament of right knee 09/2014    Past Surgical History  Procedure Laterality Date  . Wisdom tooth extraction    . Knee arthroscopy w/ acl reconstruction Left 07/17/2006  . Knee arthroscopy with anterior cruciate ligament (acl) repair with hamstring graft Right 10/14/2014    Procedure: RIGHT KNEE ARTHROSCOPY WITH ANTERIOR CRUCIATE LIGAMENT (ACL) REPAIR;  Surgeon: Mckinley Jewel, MD;  Location: [facility];  Service: Orthopedics;  Laterality: Right;  . Knee arthroscopy with lateral menisectomy Right 10/14/2014    Procedure: KNEE ARTHROSCOPY WITH LATERAL MENISECTOMY;  Surgeon: Mckinley Jewel, MD;  Location: [facility];  Service: Orthopedics;  Laterality: Right;    There were no vitals filed for this visit.  Visit Diagnosis:  S/P ACL repair  Right knee pain  Knee stiffness, right  Unsteadiness  Difficulty walking  Edema  Abnormality of gait      Subjective Assessment - 11/24/14 1656    Subjective Pt reported MD removed strip and no longer ambulating with crutch.  MD stated she was 3 weeks behind  schedule.     Currently in Pain? No/denies           [facility] Adult PT Treatment/Exercise - 11/24/14 0001    Exercises   Exercises Knee/Hip   Knee/Hip Exercises: Stretches   Active Hamstring Stretch 3 reps;30 seconds   Active Hamstring Stretch Limitations 12 inch step   Gastroc Stretch Both;3 reps;30 seconds   Gastroc Stretch Limitations slantboard    Knee/Hip Exercises: Aerobic   Stationary Bike rocking x 8 min   Knee/Hip Exercises: Standing   Heel Raises Right;15 reps   Heel Raises Limitations with step up   Knee Flexion Right;15 reps   Knee Flexion Limitations toes on 4in   Forward Lunges 1 set;10 reps;Right   Forward Lunges Limitations 4in step   Side Lunges 1 set;10 reps;Right   Side Lunges Limitations 6in step   Lateral Step Up Hand Hold: 2;Step Height: 4";15 reps   Forward Step Up Right;15 reps;Hand Hold: 1;Step Height: 6"   Forward Step Up Limitations with heel raise following step up   Step Down Right;10 reps;Hand Hold: 1;Step Height: 4"   Functional Squat 15 reps   Wall Squat 10 reps;5 seconds   Knee/Hip Exercises: Supine   Quad Sets 20 reps   Short Arc Quad Sets 15 reps   Short Arc Quad Sets Limitations 5second holds   Heel Slides 15 reps   Heel Slides Limitations 3-97   Knee/Hip Exercises: Prone   Contract/Relax to Increase Flexion 10x   Manual Therapy  Manual Therapy Soft tissue mobilization   Soft tissue mobilization soft tissue massage, muscle play to quads to reduce spasms distal quad, scar tissue massage over incision            PT Short Term Goals - 11/22/14 1819    PT SHORT TERM GOAL #1   Title Patient will demonstrate R knee ROM of 0-120 degrees with no pain   PT SHORT TERM GOAL #2   Title Patient will demonstrate at least 4-/5 strength in R knee and at least 4+/5 strength in L LE    Status On-going   PT SHORT TERM GOAL #3   Title Patient to be ambulating unlimited distances without crutches and WBAT R LE, equal step lengths, equal weight  bearing each side, minimal unsteadiness, pain no more than 1/10   Status On-going   PT SHORT TERM GOAL #4   Title Patient will experience 0/10 pain R knee  with all functional weight bearing tasks and exercises    Status On-going   PT SHORT TERM GOAL #5   Title Patient to be independent in consistently and correctly performing  appropriate HEP, to be updated PRN    Baseline 08/29/216:  Reports compliance daily   Status Achieved           PT Long Term Goals - 11/22/14 1822    PT LONG TERM GOAL #1   Title Patient to have 0 degrees extension  and 130 degrees flexion R knee, pain 0/10   PT LONG TERM GOAL #2   Title Patient will be able to perform single leg squat to floor with R knee, minimal knee valgus, and pain 0/10   PT LONG TERM GOAL #3   Title Patient will be able to perform single leg hops of equal distance with bilateral lower extremities and pain 0/10 R knee    PT LONG TERM GOAL #4   Title Patient to be able to jump off of at least 14 inch box and land on bilateral feet with good technique, good knee positioning with minimal knee valgus, and pain R knee 0/10   PT LONG TERM GOAL #5   Title Patient to be able to maintain single leg stance on BOSU for at least 30 seconds with mild to moderate perturbations, pain R knee 0/10   PT LONG TERM GOAL #6   Title Patient will demonstrate the ability to safely ascend and descend full height ladder and crawl around obstacles with pain R knee 0/10               Plan - 11/24/14 1806    Clinical Impression Statement 1 rep max complete for quad at 32% and hamstrings 43%.  Session focus on improving AROM and functional strengthening per week 4 protocol (currently 6 weeks post-op).  Contnued manual soft tissue techniques to quad to reduce tightness with vast improvements noted.  Began rocking on bike for ROM and  began step down training for eccentric quad control.  AROM improved 3-97, able to acheive 100 degrees with AAROM.  Pt does continue  to c/o knee "catching" with flexion.     PT Next Visit Plan Reassessment due next session.   Continue with manual therapy to quads, ROM exercises, and progress per ACL protocol.          Problem List There are no active problems to display for this patient.  [address], LPTA; CBIS [phone]  Juel Burrow 11/24/2014, 6:37 PM  [facility]  [place] [address] [place], [place], [address] Phone: [phone]   Fax:  [phone]

## 2014-11-26 ENCOUNTER — Ambulatory Visit (HOSPITAL_COMMUNITY): Payer: Federal, State, Local not specified - PPO | Attending: Orthopedic Surgery | Admitting: Physical Therapy

## 2014-11-26 DIAGNOSIS — R269 Unspecified abnormalities of gait and mobility: Secondary | ICD-10-CM | POA: Insufficient documentation

## 2014-11-26 DIAGNOSIS — R2681 Unsteadiness on feet: Secondary | ICD-10-CM | POA: Insufficient documentation

## 2014-11-26 DIAGNOSIS — R609 Edema, unspecified: Secondary | ICD-10-CM

## 2014-11-26 DIAGNOSIS — M25661 Stiffness of right knee, not elsewhere classified: Secondary | ICD-10-CM | POA: Diagnosis present

## 2014-11-26 DIAGNOSIS — R262 Difficulty in walking, not elsewhere classified: Secondary | ICD-10-CM | POA: Diagnosis present

## 2014-11-26 DIAGNOSIS — M25561 Pain in right knee: Secondary | ICD-10-CM

## 2014-11-26 DIAGNOSIS — Z9889 Other specified postprocedural states: Secondary | ICD-10-CM | POA: Diagnosis not present

## 2014-11-26 NOTE — Therapy (Signed)
[facility] [address] [place], [place], [address] Phone: [phone]   Fax:  [phone]  Physical Therapy Treatment  Patient Details  Name: Ariel Parker MRN: [id number] Date of Birth: 12/13/1990 Referring Provider:  Babs Sciara, MD  Encounter Date: 11/26/2014      PT End of Session - 11/26/14 1717    Visit Number 13   Number of Visits 18   Date for PT Re-Evaluation 11/23/14   Authorization Type [employer] (PROGRESS NOTE MUST BE DONE WEEKLY FOR MILITARY, give to patient each Friday)   Authorization Time Period 10/26/14 to 12/26/14   PT Start Time 1604   PT Stop Time 1700   PT Time Calculation (min) 56 min   Activity Tolerance Patient tolerated treatment well   Behavior During Therapy [facility] for tasks assessed/performed      Past Medical History  Diagnosis Date  . Disruption of anterior cruciate ligament of right knee 09/2014    Past Surgical History  Procedure Laterality Date  . Wisdom tooth extraction    . Knee arthroscopy w/ acl reconstruction Left 07/17/2006  . Knee arthroscopy with anterior cruciate ligament (acl) repair with hamstring graft Right 10/14/2014    Procedure: RIGHT KNEE ARTHROSCOPY WITH ANTERIOR CRUCIATE LIGAMENT (ACL) REPAIR;  Surgeon: Mckinley Jewel, MD;  Location: [facility];  Service: Orthopedics;  Laterality: Right;  . Knee arthroscopy with lateral menisectomy Right 10/14/2014    Procedure: KNEE ARTHROSCOPY WITH LATERAL MENISECTOMY;  Surgeon: Mckinley Jewel, MD;  Location: [facility];  Service: Orthopedics;  Laterality: Right;    There were no vitals filed for this visit.  Visit Diagnosis:  S/P ACL repair  Right knee pain  Knee stiffness, right  Unsteadiness  Difficulty walking  Edema      Subjective Assessment - 11/26/14 1723    Subjective Pt states her knee is still really sore.  States she has been concentrating on heel to toe gait and completing her HEP.   Pertinent History Patient was playing soccer, jumped up and landed and heard a pop. ACL surgery was the 21st of July. Things at home have been going OK but at one point MD thought there might have been a blood clot, which did come back negative. Was taken off of CPM due to blood clot concern and to her knowledge is not going to be put back on CPM.    Currently in Pain? No/denies                         [facility] Adult PT Treatment/Exercise - 11/26/14 1711    Knee/Hip Exercises: Stretches   Active Hamstring Stretch 3 reps;30 seconds   Active Hamstring Stretch Limitations 12 inch step   Quad Stretch 4 reps;30 seconds   Quad Stretch Limitations prone with rope   Knee: Self-Stretch to increase Flexion 10 seconds;3 reps   Knee: Self-Stretch Limitations 12" step   Gastroc Stretch Both;3 reps;30 seconds   Gastroc Stretch Limitations slantboard    Knee/Hip Exercises: Aerobic   Stationary Bike full revolutions 6 minutes seat 8   Knee/Hip Exercises: Standing   Forward Lunges 1 set;10 reps;Right   Forward Lunges Limitations 4in step   Side Lunges 1 set;10 reps;Right   Side Lunges Limitations 6in step   Lateral Step Up Hand Hold: 2;Step Height: 4";15 reps   Forward Step Up Right;15 reps;Hand Hold: 1;Step Height: 6"   Forward Step Up Limitations with heel raise following step  up   Step Down Right;10 reps;Hand Hold: 1;Step Height: 4"   Functional Squat 15 reps   Knee/Hip Exercises: Supine   Heel Slides 10 reps   Heel Slides Limitations 3-105   Manual Therapy   Manual Therapy Myofascial release   Myofascial Release scar tissue                  PT Short Term Goals - 11/22/14 1819    PT SHORT TERM GOAL #1   Title Patient will demonstrate R knee ROM of 0-120 degrees with no pain   PT SHORT TERM GOAL #2   Title Patient will demonstrate at least 4-/5 strength in R knee and at least 4+/5 strength in L LE    Status On-going   PT SHORT TERM GOAL #3   Title Patient to be  ambulating unlimited distances without crutches and WBAT R LE, equal step lengths, equal weight bearing each side, minimal unsteadiness, pain no more than 1/10   Status On-going   PT SHORT TERM GOAL #4   Title Patient will experience 0/10 pain R knee  with all functional weight bearing tasks and exercises    Status On-going   PT SHORT TERM GOAL #5   Title Patient to be independent in consistently and correctly performing  appropriate HEP, to be updated PRN    Baseline 08/29/216:  Reports compliance daily   Status Achieved           PT Long Term Goals - 11/22/14 1822    PT LONG TERM GOAL #1   Title Patient to have 0 degrees extension  and 130 degrees flexion R knee, pain 0/10   PT LONG TERM GOAL #2   Title Patient will be able to perform single leg squat to floor with R knee, minimal knee valgus, and pain 0/10   PT LONG TERM GOAL #3   Title Patient will be able to perform single leg hops of equal distance with bilateral lower extremities and pain 0/10 R knee    PT LONG TERM GOAL #4   Title Patient to be able to jump off of at least 14 inch box and land on bilateral feet with good technique, good knee positioning with minimal knee valgus, and pain R knee 0/10   PT LONG TERM GOAL #5   Title Patient to be able to maintain single leg stance on BOSU for at least 30 seconds with mild to moderate perturbations, pain R knee 0/10   PT LONG TERM GOAL #6   Title Patient will demonstrate the ability to safely ascend and descend full height ladder and crawl around obstacles with pain R knee 0/10               Plan - 11/26/14 1718    Clinical Impression Statement continued to focus on improving ROM and functional strength.  Aggresive with flexion today reaching 105 degrees.  Continues to have scar tissue that is adhering and causing discomfort with ROM.  Pt able to make full revolutions on bike today, first backward then forward.  Overall progressing.    PT Next Visit Plan Continue with  manual therapy to quads, ROM exercises, and progress per ACL protocol.  May add BIODEX for PROM        Problem List There are no active problems to display for this patient.   Lurena Nida, PTA/CLT [phone] 11/26/2014, 5:24 PM  [facility] [address] [place], [place], [address]  Phone: [phone]   Fax:  [phone]

## 2014-12-01 ENCOUNTER — Ambulatory Visit (HOSPITAL_COMMUNITY): Payer: Federal, State, Local not specified - PPO | Admitting: Physical Therapy

## 2014-12-01 DIAGNOSIS — R609 Edema, unspecified: Secondary | ICD-10-CM

## 2014-12-01 DIAGNOSIS — R269 Unspecified abnormalities of gait and mobility: Secondary | ICD-10-CM

## 2014-12-01 DIAGNOSIS — Z9889 Other specified postprocedural states: Secondary | ICD-10-CM | POA: Diagnosis not present

## 2014-12-01 DIAGNOSIS — R2681 Unsteadiness on feet: Secondary | ICD-10-CM

## 2014-12-01 DIAGNOSIS — M25561 Pain in right knee: Secondary | ICD-10-CM

## 2014-12-01 DIAGNOSIS — R262 Difficulty in walking, not elsewhere classified: Secondary | ICD-10-CM

## 2014-12-01 DIAGNOSIS — M25661 Stiffness of right knee, not elsewhere classified: Secondary | ICD-10-CM

## 2014-12-01 NOTE — Therapy (Signed)
[facility] [address] [place], [place], [address] Phone: [phone]   Fax:  [phone]  Physical Therapy Treatment (Re-Assessment)  Patient Details  Name: Ariel Parker MRN: [id number] Date of Birth: 04-21-1990 Referring Provider:  Loreta Ave, MD  Encounter Date: 12/01/2014      PT End of Session - 12/01/14 1733    Visit Number 14   Number of Visits 24   Date for PT Re-Evaluation 12/29/14   Authorization Type [employer] (PROGRESS NOTE MUST BE DONE WEEKLY FOR MILITARY, give to patient each Friday)   Authorization Time Period 10/26/14 to 12/26/14   Activity Tolerance Patient tolerated treatment well   Behavior During Therapy [facility] for tasks assessed/performed      Past Medical History  Diagnosis Date  . Disruption of anterior cruciate ligament of right knee 09/2014    Past Surgical History  Procedure Laterality Date  . Wisdom tooth extraction    . Knee arthroscopy w/ acl reconstruction Left 07/17/2006  . Knee arthroscopy with anterior cruciate ligament (acl) repair with hamstring graft Right 10/14/2014    Procedure: RIGHT KNEE ARTHROSCOPY WITH ANTERIOR CRUCIATE LIGAMENT (ACL) REPAIR;  Surgeon: Mckinley Jewel, MD;  Location: [facility];  Service: Orthopedics;  Laterality: Right;  . Knee arthroscopy with lateral menisectomy Right 10/14/2014    Procedure: KNEE ARTHROSCOPY WITH LATERAL MENISECTOMY;  Surgeon: Mckinley Jewel, MD;  Location: [facility];  Service: Orthopedics;  Laterality: Right;    There were no vitals filed for this visit.  Visit Diagnosis:  S/P ACL repair  Right knee pain  Knee stiffness, right  Unsteadiness  Difficulty walking  Edema  Abnormality of gait      Subjective Assessment - 12/01/14 1655    Subjective Patient reports that she was very sore after last workout; reports her knee feels looser but it is still tight    Pertinent History Patient was playing soccer, jumped  up and landed and heard a pop. ACL surgery was the 21st of July. Things at home have been going OK but at one point MD thought there might have been a blood clot, which did come back negative. Was taken off of CPM due to blood clot concern and to her knowledge is not going to be put back on CPM.    How long can you sit comfortably? Able to sit comfortably for hours with stiffness noted (Sitting in standard chair about 30 minutes)   How long can you stand comfortably? Able to stand for couple of hours (15 minutes with walker)   How long can you walk comfortably? 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 No/denies            [facility] PT Assessment - 12/01/14 0001    Observation/Other Assessments   Observations continues to have R quad tightness; 5 times sit to stand in 8 seconds    Focus on Therapeutic Outcomes (FOTO)  55% limited (73% limited)   AROM   Right Knee Extension 3   Right Knee Flexion 101   Strength   Right Hip Flexion 5/5   Right Hip Extension 4/5   Right Hip ABduction 4+/5   Left Hip Flexion 5/5   Left Hip Extension 4+/5   Left  Hip ABduction 4+/5   Right Knee Flexion 4-/5   Right Knee Extension 3+/5   Left Knee Flexion 5/5   Left Knee Extension 5/5   Right Ankle Dorsiflexion 5/5   Left Ankle Dorsiflexion 5/5                     [facility] Adult PT Treatment/Exercise - 12/01/14 0001    Knee/Hip Exercises: Stretches   Active Hamstring Stretch 3 reps;30 seconds   Active Hamstring Stretch Limitations 12 inch step   Quad Stretch 4 reps;30 seconds   Quad Stretch Limitations prone with rope   Gastroc Stretch Both;3 reps;30 seconds   Gastroc Stretch Limitations slantboard    Knee/Hip Exercises: Aerobic   Stationary Bike rocking progressing to full  revolutions seat 8 x8 minutes                 PT Education - 12/01/14 1733    Education provided Yes   Education Details progress with skilled PT services, plan of care moving forward    Person(s) Educated Patient;Parent(s)   Methods Explanation   Comprehension Verbalized understanding          PT Short Term Goals - 12/01/14 1721    PT SHORT TERM GOAL #1   Title Patient will demonstrate R knee ROM of 0-120 degrees with no pain   Baseline 9/7- continues to be limited by quad contracture but this is improving    Time 3   Period Weeks   Status On-going   PT SHORT TERM GOAL #2   Title Patient will demonstrate at least 4-/5 strength in R knee and at least 4+/5 strength in L LE    Time 3   Period Weeks   Status On-going   PT SHORT TERM GOAL #3   Title Patient to be ambulating unlimited distances without crutches and WBAT R LE, equal step lengths, equal weight bearing each side, minimal unsteadiness, pain no more than 1/10   Baseline 9/7- continues to have signficant soreness with gait without AD, pain 5-6/10   Time 3   Period Weeks   Status On-going   PT SHORT TERM GOAL #4   Title Patient will experience 0/10 pain R knee  with all functional weight bearing tasks and exercises    Time 3   Period Weeks   Status On-going   PT SHORT TERM GOAL #5   Title Patient to be independent in consistently and correctly performing  appropriate HEP, to be updated PRN    Time 3   Period Weeks   Status Achieved           PT Long Term Goals - 12/01/14 1724    PT LONG TERM GOAL #1   Title Patient to have 0 degrees extension  and 130 degrees flexion R knee, pain 0/10   Time 6   Period Weeks   Status On-going   PT LONG TERM GOAL #2   Title Patient will be able to perform single leg squat to floor with R knee, minimal knee valgus, and pain 0/10   Time 6   Period Weeks   Status On-going   PT LONG TERM GOAL #3   Title Patient will be able to perform single leg hops of equal  distance with bilateral lower extremities and pain 0/10 R knee    Time 6   Period Weeks   Status On-going   PT LONG TERM GOAL #4   Title Patient to be able to  jump off of at least 14 inch box and land on bilateral feet with good technique, good knee positioning with minimal knee valgus, and pain R knee 0/10   Time 6   Period Weeks   Status On-going   PT LONG TERM GOAL #5   Title Patient to be able to maintain single leg stance on BOSU for at least 30 seconds with mild to moderate perturbations, pain R knee 0/10   Time 6   Period Weeks   Status On-going   PT LONG TERM GOAL #6   Title Patient will demonstrate the ability to safely ascend and descend full height ladder and crawl around obstacles with pain R knee 0/10   Time 6   Period Weeks   Status On-going               Plan - 12/01/14 1733    Clinical Impression Statement Re-assessment performed today. Patient shows improvement in strength, ROM, and ability to ambulate without assistive device as well as overall gait mechanics. However patient does continue to be limited by reduced ROM related to likely quad contracture, although she has been improving at a decent rate since cause of ROM limitation was identified. At this time patient would not be able to safely return to her PLOF or perform work requirements, and will benefit from a continuation of skilled PT services in order to address her functional limitations and assist her in returning to an optimal level of function.    Pt will benefit from skilled therapeutic intervention in order to improve on the following deficits Abnormal gait;Decreased coordination;Decreased range of motion;Difficulty walking;Impaired tone;Decreased safety awareness;Decreased activity tolerance;Pain;Decreased balance;Impaired flexibility;Improper body mechanics;Decreased mobility;Decreased strength;Increased edema;Postural dysfunction   Rehab Potential Excellent   PT Frequency 3x / week   PT Duration 4  weeks   PT Treatment/Interventions ADLs/Self Care Home Management;Cryotherapy;Electrical Stimulation;DME Instruction;Gait training;Stair training;Functional mobility training;Therapeutic activities;Therapeutic exercise;Balance training;Neuromuscular re-education;Patient/family education;Manual techniques;Passive range of motion   PT Next Visit Plan Continue with manual therapy to quads, ROM exercises, and progress per ACL protocol.  May add BIODEX for PROM.   PT Home Exercise Plan global SLR and SAQ given today   Recommended Other Services ACL protocol. Pt is 3 weeks on 8/11, 4 weeks on 8/18. Progress to phase II when flexion is 90 degrees and phase III when flexion is 120 degreestaken    Consulted and Agree with Plan of Care Patient        Problem List There are no active problems to display for this patient.  Physical Therapy Progress Note  Dates of Reporting Period: 10/26/14 to 12/01/14  Objective Reports of Subjective Statement: see above   Objective Measurements: see above   Goal Update: see above   Plan: see above   Reason Skilled Services are Required: reduced knee ROM, reduced muscle strength and R knee instability, high risk of re-injury at this time, reduced ability to return to [facility] and work required activities, pain     Nedra Hai PT, DPT [phone]  [facility] [address] [place], [place], [address] Phone: [phone]   Fax:  [phone]

## 2014-12-03 ENCOUNTER — Ambulatory Visit (HOSPITAL_COMMUNITY): Payer: Federal, State, Local not specified - PPO

## 2014-12-03 DIAGNOSIS — R269 Unspecified abnormalities of gait and mobility: Secondary | ICD-10-CM

## 2014-12-03 DIAGNOSIS — R262 Difficulty in walking, not elsewhere classified: Secondary | ICD-10-CM

## 2014-12-03 DIAGNOSIS — R2681 Unsteadiness on feet: Secondary | ICD-10-CM

## 2014-12-03 DIAGNOSIS — Z9889 Other specified postprocedural states: Secondary | ICD-10-CM | POA: Diagnosis not present

## 2014-12-03 DIAGNOSIS — M25661 Stiffness of right knee, not elsewhere classified: Secondary | ICD-10-CM

## 2014-12-03 DIAGNOSIS — R609 Edema, unspecified: Secondary | ICD-10-CM

## 2014-12-03 DIAGNOSIS — M25561 Pain in right knee: Secondary | ICD-10-CM

## 2014-12-03 NOTE — Therapy (Signed)
[facility] [address] [place], [place], [address] Phone: [phone]   Fax:  [phone]  Physical Therapy Treatment  Patient Details  Name: Ariel Parker MRN: [id number] Date of Birth: [birth year] Referring Provider:  Babs Sciara, MD  Encounter Date: 12/03/2014      PT End of Session - 12/03/14 1736    Visit Number 15   Number of Visits 24   Date for PT Re-Evaluation 12/29/14   Authorization Type [employer] (PROGRESS NOTE MUST BE DONE WEEKLY FOR MILITARY, give to patient each Friday)   Authorization Time Period 10/26/14 to 12/26/14   PT Start Time 1647   PT Stop Time 1745   PT Time Calculation (min) 58 min   Activity Tolerance Patient tolerated treatment well   Behavior During Therapy [facility] for tasks assessed/performed      Past Medical History  Diagnosis Date  . Disruption of anterior cruciate ligament of right knee 09/2014    Past Surgical History  Procedure Laterality Date  . Wisdom tooth extraction    . Knee arthroscopy w/ acl reconstruction Left 07/17/2006  . Knee arthroscopy with anterior cruciate ligament (acl) repair with hamstring graft Right 10/14/2014    Procedure: RIGHT KNEE ARTHROSCOPY WITH ANTERIOR CRUCIATE LIGAMENT (ACL) REPAIR;  Surgeon: Mckinley Jewel, MD;  Location: [facility];  Service: Orthopedics;  Laterality: Right;  . Knee arthroscopy with lateral menisectomy Right 10/14/2014    Procedure: KNEE ARTHROSCOPY WITH LATERAL MENISECTOMY;  Surgeon: Mckinley Jewel, MD;  Location: [facility];  Service: Orthopedics;  Laterality: Right;    There were no vitals filed for this visit.  Visit Diagnosis:  S/P ACL repair  Right knee pain  Knee stiffness, right  Unsteadiness  Difficulty walking  Edema  Abnormality of gait      Subjective Assessment - 12/03/14 1729    Subjective Pain free today, has been working on stairs and HEP at home   Currently in Pain? No/denies            [facility] Adult PT Treatment/Exercise - 12/03/14 0001    Exercises   Exercises Knee/Hip   Knee/Hip Exercises: Stretches   Active Hamstring Stretch 3 reps;30 seconds   Active Hamstring Stretch Limitations 12 inch step   Quad Stretch 4 reps;30 seconds   Quad Stretch Limitations prone with rope   Knee: Self-Stretch to increase Flexion 10 seconds;3 reps   Knee: Self-Stretch Limitations 12" step   Gastroc Stretch Both;3 reps;30 seconds   Gastroc Stretch Limitations slantboard    Knee/Hip Exercises: Aerobic   Stationary Bike Full revolution seat 8 x 8 minutes   Knee/Hip Exercises: Machines for Strengthening   Other Machine Biodex PROM  100% at limits 1 and 2   Knee/Hip Exercises: Standing   Terminal Knee Extension Right;15 reps   Theraband Level (Terminal Knee Extension) Level 3 (Green)   Lateral Step Up Right;15 reps;Hand Hold: 1;Step Height: 6"   Forward Step Up Right;20 reps;Hand Hold: 1;Step Height: 6"   Step Down Right;20 reps;Hand Hold: 1;Step Height: 4"   Functional Squat 20 reps             PT Short Term Goals - 12/01/14 1721    PT SHORT TERM GOAL #1   Title Patient will demonstrate R knee ROM of 0-120 degrees with no pain   Baseline 9/7- continues to be limited by quad contracture but this is improving    Time 3   Period Weeks   Status On-going  PT SHORT TERM GOAL #2   Title Patient will demonstrate at least 4-/5 strength in R knee and at least 4+/5 strength in L LE    Time 3   Period Weeks   Status On-going   PT SHORT TERM GOAL #3   Title Patient to be ambulating unlimited distances without crutches and WBAT R LE, equal step lengths, equal weight bearing each side, minimal unsteadiness, pain no more than 1/10   Baseline 9/7- continues to have signficant soreness with gait without AD, pain 5-6/10   Time 3   Period Weeks   Status On-going   PT SHORT TERM GOAL #4   Title Patient will experience 0/10 pain R knee  with all functional weight bearing tasks and  exercises    Time 3   Period Weeks   Status On-going   PT SHORT TERM GOAL #5   Title Patient to be independent in consistently and correctly performing  appropriate HEP, to be updated PRN    Time 3   Period Weeks   Status Achieved           PT Long Term Goals - 12/01/14 1724    PT LONG TERM GOAL #1   Title Patient to have 0 degrees extension  and 130 degrees flexion R knee, pain 0/10   Time 6   Period Weeks   Status On-going   PT LONG TERM GOAL #2   Title Patient will be able to perform single leg squat to floor with R knee, minimal knee valgus, and pain 0/10   Time 6   Period Weeks   Status On-going   PT LONG TERM GOAL #3   Title Patient will be able to perform single leg hops of equal distance with bilateral lower extremities and pain 0/10 R knee    Time 6   Period Weeks   Status On-going   PT LONG TERM GOAL #4   Title Patient to be able to jump off of at least 14 inch box and land on bilateral feet with good technique, good knee positioning with minimal knee valgus, and pain R knee 0/10   Time 6   Period Weeks   Status On-going   PT LONG TERM GOAL #5   Title Patient to be able to maintain single leg stance on BOSU for at least 30 seconds with mild to moderate perturbations, pain R knee 0/10   Time 6   Period Weeks   Status On-going   PT LONG TERM GOAL #6   Title Patient will demonstrate the ability to safely ascend and descend full height ladder and crawl around obstacles with pain R knee 0/10   Time 6   Period Weeks   Status On-going               Plan - 12/03/14 1753    Clinical Impression Statement Session focus on functional strengthening and introduced BIODEX PROM for extension and flexion.  Pt able to demonstrate appropraite technique with all exercises with min cueing for form and to reduce compensation with stair training.  Able to achieve 100% PROM on BIodex today but noted hip rises as compensation due to decreased ROM.  No reports of increased  pain through session.  Pt current progress at phase II with ACL protocol due to decreased ROM.     PT Next Visit Plan Continue with manual therapy to quads, ROM exercises, and progress per ACL protocol.  Problem List There are no active problems to display for this patient.  [address] LPTA; CBIS [phone]  Juel Burrow 12/03/2014, 5:58 PM  [facility] [address] [place], [place], [address] Phone: [phone]   Fax:  [phone]

## 2014-12-06 ENCOUNTER — Ambulatory Visit (HOSPITAL_COMMUNITY): Payer: Federal, State, Local not specified - PPO | Admitting: Physical Therapy

## 2014-12-06 DIAGNOSIS — M25561 Pain in right knee: Secondary | ICD-10-CM

## 2014-12-06 DIAGNOSIS — Z9889 Other specified postprocedural states: Secondary | ICD-10-CM

## 2014-12-06 DIAGNOSIS — R262 Difficulty in walking, not elsewhere classified: Secondary | ICD-10-CM

## 2014-12-06 DIAGNOSIS — R609 Edema, unspecified: Secondary | ICD-10-CM

## 2014-12-06 DIAGNOSIS — M25661 Stiffness of right knee, not elsewhere classified: Secondary | ICD-10-CM

## 2014-12-06 DIAGNOSIS — R2681 Unsteadiness on feet: Secondary | ICD-10-CM

## 2014-12-06 DIAGNOSIS — R269 Unspecified abnormalities of gait and mobility: Secondary | ICD-10-CM

## 2014-12-06 NOTE — Therapy (Signed)
[facility] [address] [place], [place], [address] Phone: [phone]   Fax:  [phone]  Physical Therapy Treatment  Patient Details  Name: Ariel Parker MRN: [id number] Date of Birth: 1991/01/20 Referring Provider:  Babs Sciara, MD  Encounter Date: 12/06/2014      PT End of Session - 12/06/14 1747    Visit Number 16   Number of Visits 24   Date for PT Re-Evaluation 12/29/14   Authorization Type [employer] (PROGRESS NOTE MUST BE DONE WEEKLY FOR MILITARY, give to patient each Friday)   Authorization Time Period 10/26/14 to 12/26/14   PT Start Time 1651   PT Stop Time 1734   PT Time Calculation (min) 43 min   Activity Tolerance Patient tolerated treatment well   Behavior During Therapy [facility] for tasks assessed/performed      Past Medical History  Diagnosis Date  . Disruption of anterior cruciate ligament of right knee 09/2014    Past Surgical History  Procedure Laterality Date  . Wisdom tooth extraction    . Knee arthroscopy w/ acl reconstruction Left 07/17/2006  . Knee arthroscopy with anterior cruciate ligament (acl) repair with hamstring graft Right 10/14/2014    Procedure: RIGHT KNEE ARTHROSCOPY WITH ANTERIOR CRUCIATE LIGAMENT (ACL) REPAIR;  Surgeon: Mckinley Jewel, MD;  Location: [facility];  Service: Orthopedics;  Laterality: Right;  . Knee arthroscopy with lateral menisectomy Right 10/14/2014    Procedure: KNEE ARTHROSCOPY WITH LATERAL MENISECTOMY;  Surgeon: Mckinley Jewel, MD;  Location: [facility];  Service: Orthopedics;  Laterality: Right;    There were no vitals filed for this visit.  Visit Diagnosis:  S/P ACL repair  Right knee pain  Knee stiffness, right  Unsteadiness  Difficulty walking  Edema  Abnormality of gait      Subjective Assessment - 12/06/14 1654    Subjective Patient is pain free today, reports she was somewhat sore after last session though    Pertinent  History Patient was playing soccer, jumped up and landed and heard a pop. ACL surgery was the 21st of July. Things at home have been going OK but at one point MD thought there might have been a blood clot, which did come back negative. Was taken off of CPM due to blood clot concern and to her knowledge is not going to be put back on CPM.    Currently in Pain? No/denies                         [facility] Adult PT Treatment/Exercise - 12/06/14 0001    Knee/Hip Exercises: Stretches   Active Hamstring Stretch 3 reps;30 seconds   Active Hamstring Stretch Limitations 12 inch step   Quad Stretch 4 reps;30 seconds   Quad Stretch Limitations prone with rope   Knee: Self-Stretch to increase Flexion Other (comment)  3 second holds 10 reps    Knee: Self-Stretch Limitations 12 inch step    Gastroc Stretch Both;3 reps;30 seconds   Gastroc Stretch Limitations slantboard    Knee/Hip Exercises: Aerobic   Stationary Bike Full revolution seat 8 x 8 minutes   Knee/Hip Exercises: Standing   Lateral Step Up Right;1 set;15 reps   Lateral Step Up Limitations 6 inch box    Forward Step Up Both;20 reps   Forward Step Up Limitations 6 inch box    Functional Squat 1 set;20 reps   Rocker Board Limitations x20 AP and lateral no HHA  Manual Therapy   Manual Therapy Soft tissue mobilization   Soft tissue mobilization soft tissues mobilization R quad                 PT Education - 12/06/14 1747    Education provided Yes   Education Details advised to regularly get out of car and move around during drive to [employer]) Educated Patient   Methods Explanation   Comprehension Verbalized understanding          PT Short Term Goals - 12/01/14 1721    PT SHORT TERM GOAL #1   Title Patient will demonstrate R knee ROM of 0-120 degrees with no pain   Baseline 9/7- continues to be limited by quad contracture but this is improving    Time 3   Period Weeks   Status On-going    PT SHORT TERM GOAL #2   Title Patient will demonstrate at least 4-/5 strength in R knee and at least 4+/5 strength in L LE    Time 3   Period Weeks   Status On-going   PT SHORT TERM GOAL #3   Title Patient to be ambulating unlimited distances without crutches and WBAT R LE, equal step lengths, equal weight bearing each side, minimal unsteadiness, pain no more than 1/10   Baseline 9/7- continues to have signficant soreness with gait without AD, pain 5-6/10   Time 3   Period Weeks   Status On-going   PT SHORT TERM GOAL #4   Title Patient will experience 0/10 pain R knee  with all functional weight bearing tasks and exercises    Time 3   Period Weeks   Status On-going   PT SHORT TERM GOAL #5   Title Patient to be independent in consistently and correctly performing  appropriate HEP, to be updated PRN    Time 3   Period Weeks   Status Achieved           PT Long Term Goals - 12/01/14 1724    PT LONG TERM GOAL #1   Title Patient to have 0 degrees extension  and 130 degrees flexion R knee, pain 0/10   Time 6   Period Weeks   Status On-going   PT LONG TERM GOAL #2   Title Patient will be able to perform single leg squat to floor with R knee, minimal knee valgus, and pain 0/10   Time 6   Period Weeks   Status On-going   PT LONG TERM GOAL #3   Title Patient will be able to perform single leg hops of equal distance with bilateral lower extremities and pain 0/10 R knee    Time 6   Period Weeks   Status On-going   PT LONG TERM GOAL #4   Title Patient to be able to jump off of at least 14 inch box and land on bilateral feet with good technique, good knee positioning with minimal knee valgus, and pain R knee 0/10   Time 6   Period Weeks   Status On-going   PT LONG TERM GOAL #5   Title Patient to be able to maintain single leg stance on BOSU for at least 30 seconds with mild to moderate perturbations, pain R knee 0/10   Time 6   Period Weeks   Status On-going   PT LONG TERM GOAL  #6   Title Patient will demonstrate the ability to safely ascend and descend full height ladder and crawl  around obstacles with pain R knee 0/10   Time 6   Period Weeks   Status On-going               Plan - 12/06/14 1747    Clinical Impression Statement Continued focus on functional stretching, R quad mobility, functional exercises. Good form with exercises today, no increase in pain during session. Strongly advised patient to regularly get out of car in a public place to walk around and move leg to reduce discomfort from long drive this evening.    Pt will benefit from skilled therapeutic intervention in order to improve on the following deficits Abnormal gait;Decreased coordination;Decreased range of motion;Difficulty walking;Impaired tone;Decreased safety awareness;Decreased activity tolerance;Pain;Decreased balance;Impaired flexibility;Improper body mechanics;Decreased mobility;Decreased strength;Increased edema;Postural dysfunction   Rehab Potential Excellent   PT Frequency 3x / week   PT Duration 4 weeks   PT Treatment/Interventions ADLs/Self Care Home Management;Cryotherapy;Electrical Stimulation;DME Instruction;Gait training;Stair training;Functional mobility training;Therapeutic activities;Therapeutic exercise;Balance training;Neuromuscular re-education;Patient/family education;Manual techniques;Passive range of motion   PT Next Visit Plan Continue with manual therapy to quads, ROM exercises, and progress per ACL protocol.     PT Home Exercise Plan global SLR and SAQ given today   Consulted and Agree with Plan of Care Patient        Problem List There are no active problems to display for this patient.   Nedra Hai PT, DPT [phone]  [facility] [address] [place], [place], [address] Phone: [phone]   Fax:  [phone]

## 2014-12-08 ENCOUNTER — Ambulatory Visit (HOSPITAL_COMMUNITY): Payer: Federal, State, Local not specified - PPO | Admitting: Physical Therapy

## 2014-12-08 DIAGNOSIS — Z9889 Other specified postprocedural states: Secondary | ICD-10-CM

## 2014-12-08 DIAGNOSIS — R609 Edema, unspecified: Secondary | ICD-10-CM

## 2014-12-08 DIAGNOSIS — M25561 Pain in right knee: Secondary | ICD-10-CM

## 2014-12-08 DIAGNOSIS — R269 Unspecified abnormalities of gait and mobility: Secondary | ICD-10-CM

## 2014-12-08 DIAGNOSIS — M25661 Stiffness of right knee, not elsewhere classified: Secondary | ICD-10-CM

## 2014-12-08 DIAGNOSIS — R2681 Unsteadiness on feet: Secondary | ICD-10-CM

## 2014-12-08 DIAGNOSIS — R262 Difficulty in walking, not elsewhere classified: Secondary | ICD-10-CM

## 2014-12-08 NOTE — Therapy (Signed)
[facility] [address] [place], [place], [address] Phone: [phone]   Fax:  [phone]  Physical Therapy Treatment  Patient Details  Name: Ariel Parker MRN: [id number] Date of Birth: 08-30-1990 Referring Provider:  Babs Sciara, MD  Encounter Date: 12/08/2014      PT End of Session - 12/08/14 1738    Visit Number 17   Number of Visits 24   Date for PT Re-Evaluation 12/29/14   Authorization Type [employer] (PROGRESS NOTE MUST BE DONE WEEKLY FOR MILITARY, give to patient each Friday)   Authorization Time Period 10/26/14 to 12/26/14   PT Start Time 1650   PT Stop Time 1735   PT Time Calculation (min) 45 min   Activity Tolerance Patient tolerated treatment well   Behavior During Therapy [facility] for tasks assessed/performed      Past Medical History  Diagnosis Date  . Disruption of anterior cruciate ligament of right knee 09/2014    Past Surgical History  Procedure Laterality Date  . Wisdom tooth extraction    . Knee arthroscopy w/ acl reconstruction Left 07/17/2006  . Knee arthroscopy with anterior cruciate ligament (acl) repair with hamstring graft Right 10/14/2014    Procedure: RIGHT KNEE ARTHROSCOPY WITH ANTERIOR CRUCIATE LIGAMENT (ACL) REPAIR;  Surgeon: Mckinley Jewel, MD;  Location: [facility];  Service: Orthopedics;  Laterality: Right;  . Knee arthroscopy with lateral menisectomy Right 10/14/2014    Procedure: KNEE ARTHROSCOPY WITH LATERAL MENISECTOMY;  Surgeon: Mckinley Jewel, MD;  Location:  SUR[facility]ERY C[facility];  Service: Orthopedics;  Laterality: Right;    There were no vitals filed for this visit.  Visit Diagnosis:  S/P ACL repair  Right knee pain  Knee stiffness, right  Unsteadiness  Difficulty walking  Edema  Abnormality of gait      Subjective Assessment - 12/08/14 1652    Subjective Patient is pain free today, reports that her drive to [place] went well and that she did get up about  half way through to walk around and move about    Pertinent History Patient was playing soccer, jumped up and landed and heard a pop. ACL surgery was the 21st of July. Things at home have been going OK but at one point MD thought there might have been a blood clot, which did come back negative. Was taken off of CPM due to blood clot concern and to her knowledge is not going to be put back on CPM.    Currently in Pain? No/denies                         [facility] Adult PT Treatment/Exercise - 12/08/14 0001    Knee/Hip Exercises: Stretches   Active Hamstring Stretch 3 reps;30 seconds   Active Hamstring Stretch Limitations 14 inch box    Quad Stretch 30 seconds;5 reps   Quad Stretch Limitations prone with rope   Knee: Self-Stretch to increase Flexion Other (comment)   Knee: Self-Stretch Limitations 12 inch step    Gastroc Stretch Both;3 reps;30 seconds   Gastroc Stretch Limitations slantboard    Knee/Hip Exercises: Aerobic   Stationary Bike Full revolution seat 8 x 10 minutes   Knee/Hip Exercises: Standing   Heel Raises Right;1 set;15 reps   Heel Raises Limitations both up, R down    Forward Lunges Both;1 set;10 reps   Forward Lunges Limitations 4 inch box    Side Lunges Both;1 set;10 reps   Side Lunges Limitations 6  inch box    Forward Step Up Right;1 set;20 reps   Forward Step Up Limitations 6 inch box    Other Standing Knee Exercises 3D hip excursions 1x10, no transverse plane                 PT Education - 12/08/14 1737    Education provided Yes   Education Details education regarding importance of getting WFL ROM before being too aggressive wth strength, however iti s still important to work strength as well   Person(s) Educated Patient   Methods Explanation   Comprehension Verbalized understanding          PT Short Term Goals - 12/01/14 1721    PT SHORT TERM GOAL #1   Title Patient will demonstrate R knee ROM of 0-120 degrees with no pain   Baseline  9/7- continues to be limited by quad contracture but this is improving    Time 3   Period Weeks   Status On-going   PT SHORT TERM GOAL #2   Title Patient will demonstrate at least 4-/5 strength in R knee and at least 4+/5 strength in L LE    Time 3   Period Weeks   Status On-going   PT SHORT TERM GOAL #3   Title Patient to be ambulating unlimited distances without crutches and WBAT R LE, equal step lengths, equal weight bearing each side, minimal unsteadiness, pain no more than 1/10   Baseline 9/7- continues to have signficant soreness with gait without AD, pain 5-6/10   Time 3   Period Weeks   Status On-going   PT SHORT TERM GOAL #4   Title Patient will experience 0/10 pain R knee  with all functional weight bearing tasks and exercises    Time 3   Period Weeks   Status On-going   PT SHORT TERM GOAL #5   Title Patient to be independent in consistently and correctly performing  appropriate HEP, to be updated PRN    Time 3   Period Weeks   Status Achieved           PT Long Term Goals - 12/01/14 1724    PT LONG TERM GOAL #1   Title Patient to have 0 degrees extension  and 130 degrees flexion R knee, pain 0/10   Time 6   Period Weeks   Status On-going   PT LONG TERM GOAL #2   Title Patient will be able to perform single leg squat to floor with R knee, minimal knee valgus, and pain 0/10   Time 6   Period Weeks   Status On-going   PT LONG TERM GOAL #3   Title Patient will be able to perform single leg hops of equal distance with bilateral lower extremities and pain 0/10 R knee    Time 6   Period Weeks   Status On-going   PT LONG TERM GOAL #4   Title Patient to be able to jump off of at least 14 inch box and land on bilateral feet with good technique, good knee positioning with minimal knee valgus, and pain R knee 0/10   Time 6   Period Weeks   Status On-going   PT LONG TERM GOAL #5   Title Patient to be able to maintain single leg stance on BOSU for at least 30 seconds  with mild to moderate perturbations, pain R knee 0/10   Time 6   Period Weeks   Status On-going   PT  LONG TERM GOAL #6   Title Patient will demonstrate the ability to safely ascend and descend full height ladder and crawl around obstacles with pain R knee 0/10   Time 6   Period Weeks   Status On-going               Plan - 12/08/14 1738    Clinical Impression Statement Continued functional stretching and exercises, also R quad mobility. Patient did have increased stiffness today as she had just gotten back from 2 hour trip to [place]. Did require occasional cues for unfamiliar exercises but tolerated session well overall, and patient reports that she feels like she would be able to handle increased intensity at end of session.    Pt will benefit from skilled therapeutic intervention in order to improve on the following deficits Abnormal gait;Decreased coordination;Decreased range of motion;Difficulty walking;Impaired tone;Decreased safety awareness;Decreased activity tolerance;Pain;Decreased balance;Impaired flexibility;Improper body mechanics;Decreased mobility;Decreased strength;Increased edema;Postural dysfunction   Rehab Potential Excellent   PT Frequency 3x / week   PT Duration 4 weeks   PT Treatment/Interventions ADLs/Self Care Home Management;Cryotherapy;Electrical Stimulation;DME Instruction;Gait training;Stair training;Functional mobility training;Therapeutic activities;Therapeutic exercise;Balance training;Neuromuscular re-education;Patient/family education;Manual techniques;Passive range of motion   PT Next Visit Plan Continue with manual therapy to quads, ROM exercises, and progress per ACL protocol.  Potentially progress exercises next session per patient tolerance/safe parameters.    PT Home Exercise Plan global SLR and SAQ given today   Consulted and Agree with Plan of Care Patient        Problem List There are no active problems to display for this  patient.   Nedra Hai PT, DPT [phone]  [facility] [address] [place], [place], [address] Phone: [phone]   Fax:  [phone]

## 2014-12-10 ENCOUNTER — Ambulatory Visit (HOSPITAL_COMMUNITY): Payer: Federal, State, Local not specified - PPO | Admitting: Physical Therapy

## 2014-12-10 DIAGNOSIS — M25561 Pain in right knee: Secondary | ICD-10-CM

## 2014-12-10 DIAGNOSIS — Z9889 Other specified postprocedural states: Secondary | ICD-10-CM

## 2014-12-10 DIAGNOSIS — M25661 Stiffness of right knee, not elsewhere classified: Secondary | ICD-10-CM

## 2014-12-10 DIAGNOSIS — R609 Edema, unspecified: Secondary | ICD-10-CM

## 2014-12-10 DIAGNOSIS — R269 Unspecified abnormalities of gait and mobility: Secondary | ICD-10-CM

## 2014-12-10 DIAGNOSIS — R2681 Unsteadiness on feet: Secondary | ICD-10-CM

## 2014-12-10 DIAGNOSIS — R262 Difficulty in walking, not elsewhere classified: Secondary | ICD-10-CM

## 2014-12-10 NOTE — Therapy (Signed)
[facility] [address] [place], [place], [address] Phone: [phone]   Fax:  [phone]  Physical Therapy Treatment  Patient Details  Name: Ariel Parker MRN: [id number] Date of Birth: February 27, 1991 Referring Alizaya Oshea:  Loreta Ave, MD  Encounter Date: 12/10/2014      PT End of Session - 12/10/14 1721    Visit Number 18   Number of Visits 24   Date for PT Re-Evaluation 12/29/14   Authorization Type [employer] (PROGRESS NOTE MUST BE DONE WEEKLY FOR MILITARY, give to patient each Friday)   Authorization Time Period 10/26/14 to 12/26/14   PT Start Time 1605   PT Stop Time 1715   PT Time Calculation (min) 70 min   Activity Tolerance Patient tolerated treatment well   Behavior During Therapy [facility] for tasks assessed/performed      Past Medical History  Diagnosis Date  . Disruption of anterior cruciate ligament of right knee 09/2014    Past Surgical History  Procedure Laterality Date  . Wisdom tooth extraction    . Knee arthroscopy w/ acl reconstruction Left 07/17/2006  . Knee arthroscopy with anterior cruciate ligament (acl) repair with hamstring graft Right 10/14/2014    Procedure: RIGHT KNEE ARTHROSCOPY WITH ANTERIOR CRUCIATE LIGAMENT (ACL) REPAIR;  Surgeon: Mckinley Jewel, MD;  Location: [facility];  Service: Orthopedics;  Laterality: Right;  . Knee arthroscopy with lateral menisectomy Right 10/14/2014    Procedure: KNEE ARTHROSCOPY WITH LATERAL MENISECTOMY;  Surgeon: Mckinley Jewel, MD;  Location: [facility];  Service: Orthopedics;  Laterality: Right;    There were no vitals filed for this visit.  Visit Diagnosis:  S/P ACL repair  Right knee pain  Knee stiffness, right  Unsteadiness  Difficulty walking  Edema  Abnormality of gait      Subjective Assessment - 12/10/14 1729    Subjective Pt states her knee is feel better, more normal than before.  STates she still feels tightness distal  quad and below knee when in full flexion.   Currently in Pain? No/denies                         [facility] Adult PT Treatment/Exercise - 12/10/14 1611    Knee/Hip Exercises: Stretches   Active Hamstring Stretch 3 reps;30 seconds   Active Hamstring Stretch Limitations 14 inch box    Knee: Self-Stretch to increase Flexion Other (comment)   Knee: Self-Stretch Limitations 12 inch step    Gastroc Stretch Both;3 reps;30 seconds   Gastroc Stretch Limitations slantboard    Knee/Hip Exercises: Aerobic   Elliptical 5 minutes forward level 1   Knee/Hip Exercises: Standing   Forward Lunges Right;10 reps   Forward Lunges Limitations no riser   Side Lunges Right;10 reps   Side Lunges Limitations no riser   Lateral Step Up Right;1 set;20 reps   Lateral Step Up Limitations airex with 6 inch box    Forward Step Up Right;1 set;20 reps   Forward Step Up Limitations 6 inch box    Step Down Right;15 reps;Step Height: 4";Hand Hold: 0   Step Down Limitations 4" step   Functional Squat 10 reps;Limitations   Functional Squat Limitations single; Rt LE only   Wall Squat 5 sets   Wall Squat Limitations 15 second holds   Lunge Walking - Round Trips 2RT   Knee/Hip Exercises: Supine   Heel Slides 10 reps   Heel Slides Limitations 2-110   Manual Therapy  Manual Therapy Soft tissue mobilization   Soft tissue mobilization soft tissues mobilization R quad                   PT Short Term Goals - 12/01/14 1721    PT SHORT TERM GOAL #1   Title Patient will demonstrate R knee ROM of 0-120 degrees with no pain   Baseline 9/7- continues to be limited by quad contracture but this is improving    Time 3   Period Weeks   Status On-going   PT SHORT TERM GOAL #2   Title Patient will demonstrate at least 4-/5 strength in R knee and at least 4+/5 strength in L LE    Time 3   Period Weeks   Status On-going   PT SHORT TERM GOAL #3   Title Patient to be ambulating unlimited distances without  crutches and WBAT R LE, equal step lengths, equal weight bearing each side, minimal unsteadiness, pain no more than 1/10   Baseline 9/7- continues to have signficant soreness with gait without AD, pain 5-6/10   Time 3   Period Weeks   Status On-going   PT SHORT TERM GOAL #4   Title Patient will experience 0/10 pain R knee  with all functional weight bearing tasks and exercises    Time 3   Period Weeks   Status On-going   PT SHORT TERM GOAL #5   Title Patient to be independent in consistently and correctly performing  appropriate HEP, to be updated PRN    Time 3   Period Weeks   Status Achieved           PT Long Term Goals - 12/01/14 1724    PT LONG TERM GOAL #1   Title Patient to have 0 degrees extension  and 130 degrees flexion R knee, pain 0/10   Time 6   Period Weeks   Status On-going   PT LONG TERM GOAL #2   Title Patient will be able to perform single leg squat to floor with R knee, minimal knee valgus, and pain 0/10   Time 6   Period Weeks   Status On-going   PT LONG TERM GOAL #3   Title Patient will be able to perform single leg hops of equal distance with bilateral lower extremities and pain 0/10 R knee    Time 6   Period Weeks   Status On-going   PT LONG TERM GOAL #4   Title Patient to be able to jump off of at least 14 inch box and land on bilateral feet with good technique, good knee positioning with minimal knee valgus, and pain R knee 0/10   Time 6   Period Weeks   Status On-going   PT LONG TERM GOAL #5   Title Patient to be able to maintain single leg stance on BOSU for at least 30 seconds with mild to moderate perturbations, pain R knee 0/10   Time 6   Period Weeks   Status On-going   PT LONG TERM GOAL #6   Title Patient will demonstrate the ability to safely ascend and descend full height ladder and crawl around obstacles with pain R knee 0/10   Time 6   Period Weeks   Status On-going               Plan - 12/10/14 1722    Clinical  Impression Statement Progressed therex as patient is now 8 weeks post op with nearly full  ROM.  Progressed to week 6 today adding progressive lunges, single leg squats, wall sits and elliptical.  Pt able to complete all exericses without pain, however with noted fatigue.  AROM measured today at -2 to 110 degrees in supine.  Pt instructed to continue HEP with progression of wallsquats, lunges and squats and continue SLR as she continues to have a slight extension lag with actvitiy.  Also progressed step ups with airex pad and lunges to ground level     PT Next Visit Plan Continue manual to distal quad and knee PRN.  Progress per 6 weeks ACL due to being behind from slow ROM gains.  Add cybex leg press, SLS, vector stance, and other stability/strengthening exeriises per protocol but painfree.    Consulted and Agree with Plan of Care Patient        Problem List There are no active problems to display for this patient.  Lurena Nida, PTA/CLT [phone] 12/10/2014, 5:38 PM  [facility] [address] [place], [place], [address] Phone: [phone]   Fax:  [phone]

## 2014-12-13 ENCOUNTER — Ambulatory Visit (HOSPITAL_COMMUNITY): Payer: Federal, State, Local not specified - PPO | Admitting: Physical Therapy

## 2014-12-13 DIAGNOSIS — R2681 Unsteadiness on feet: Secondary | ICD-10-CM

## 2014-12-13 DIAGNOSIS — Z9889 Other specified postprocedural states: Secondary | ICD-10-CM | POA: Diagnosis not present

## 2014-12-13 DIAGNOSIS — R262 Difficulty in walking, not elsewhere classified: Secondary | ICD-10-CM

## 2014-12-13 DIAGNOSIS — R609 Edema, unspecified: Secondary | ICD-10-CM

## 2014-12-13 DIAGNOSIS — R269 Unspecified abnormalities of gait and mobility: Secondary | ICD-10-CM

## 2014-12-13 DIAGNOSIS — M25661 Stiffness of right knee, not elsewhere classified: Secondary | ICD-10-CM

## 2014-12-13 DIAGNOSIS — M25561 Pain in right knee: Secondary | ICD-10-CM

## 2014-12-13 NOTE — Therapy (Signed)
[facility] [address] [place], [place], [address] Phone: [phone]   Fax:  [phone]  Physical Therapy Treatment  Patient Details  Name: Ariel Parker MRN: [id number] Date of Birth: [birth year] Referring Provider:  Babs Sciara, MD  Encounter Date: 12/13/2014      PT End of Session - 12/13/14 1734    Visit Number 19   Number of Visits 24   Date for PT Re-Evaluation 12/29/14   Authorization Type [employer] (PROGRESS NOTE MUST BE DONE WEEKLY FOR MILITARY, give to patient each Friday)   Authorization Time Period 10/26/14 to 12/26/14   PT Start Time 1646   PT Stop Time 1727   PT Time Calculation (min) 41 min   Activity Tolerance Patient tolerated treatment well   Behavior During Therapy [facility] for tasks assessed/performed      Past Medical History  Diagnosis Date  . Disruption of anterior cruciate ligament of right knee 09/2014    Past Surgical History  Procedure Laterality Date  . Wisdom tooth extraction    . Knee arthroscopy w/ acl reconstruction Left 07/17/2006  . Knee arthroscopy with anterior cruciate ligament (acl) repair with hamstring graft Right 10/14/2014    Procedure: RIGHT KNEE ARTHROSCOPY WITH ANTERIOR CRUCIATE LIGAMENT (ACL) REPAIR;  Surgeon: Mckinley Jewel, MD;  Location: [facility];  Service: Orthopedics;  Laterality: Right;  . Knee arthroscopy with lateral menisectomy Right 10/14/2014    Procedure: KNEE ARTHROSCOPY WITH LATERAL MENISECTOMY;  Surgeon: Mckinley Jewel, MD;  Location:  [facility];  Service: Orthopedics;  Laterality: Right;    There were no vitals filed for this visit.  Visit Diagnosis:  S/P ACL repair  Right knee pain  Knee stiffness, right  Unsteadiness  Difficulty walking  Edema  Abnormality of gait      Subjective Assessment - 12/13/14 1648    Subjective Patient reports that she was feeling sore after last workout; reports she is feeling fine today     Pertinent History Patient was playing soccer, jumped up and landed and heard a pop. ACL surgery was the 21st of July. Things at home have been going OK but at one point MD thought there might have been a blood clot, which did come back negative. Was taken off of CPM due to blood clot concern and to her knowledge is not going to be put back on CPM.    Currently in Pain? No/denies                         [facility] Adult PT Treatment/Exercise - 12/13/14 0001    Knee/Hip Exercises: Stretches   Active Hamstring Stretch 3 reps;30 seconds   Active Hamstring Stretch Limitations 12 inch box    Quad Stretch Right;4 reps;30 seconds   Quad Stretch Limitations prone with rope   Knee: Self-Stretch to increase Flexion Other (comment)   Knee: Self-Stretch Limitations 12 inch step    Gastroc Stretch Both;3 reps;30 seconds   Gastroc Stretch Limitations slantboard    Knee/Hip Exercises: Aerobic   Elliptical 8 minutes forward level 1   Knee/Hip Exercises: Standing   Heel Raises Right;1 set;20 reps   Forward Lunges Right;1 set;15 reps   Forward Lunges Limitations 4 inch box    Side Lunges Right;1 set;15 reps   Side Lunges Limitations 6 inch box    Step Down Right;Both;1 set;15 reps   Step Down Limitations 4 inch box    Wall Squat 10 reps  Wall Squat Limitations 15 second holds   Other Standing Knee Exercises Hamstring and quad pulls on horse 39ft each    Other Standing Knee Exercises 3D hip excursions split stance 1x10 (no transverse plane)                PT Education - 12/13/14 1734    Education provided No          PT Short Term Goals - 12/01/14 1721    PT SHORT TERM GOAL #1   Title Patient will demonstrate R knee ROM of 0-120 degrees with no pain   Baseline 9/7- continues to be limited by quad contracture but this is improving    Time 3   Period Weeks   Status On-going   PT SHORT TERM GOAL #2   Title Patient will demonstrate at least 4-/5 strength in R knee and at  least 4+/5 strength in L LE    Time 3   Period Weeks   Status On-going   PT SHORT TERM GOAL #3   Title Patient to be ambulating unlimited distances without crutches and WBAT R LE, equal step lengths, equal weight bearing each side, minimal unsteadiness, pain no more than 1/10   Baseline 9/7- continues to have signficant soreness with gait without AD, pain 5-6/10   Time 3   Period Weeks   Status On-going   PT SHORT TERM GOAL #4   Title Patient will experience 0/10 pain R knee  with all functional weight bearing tasks and exercises    Time 3   Period Weeks   Status On-going   PT SHORT TERM GOAL #5   Title Patient to be independent in consistently and correctly performing  appropriate HEP, to be updated PRN    Time 3   Period Weeks   Status Achieved           PT Long Term Goals - 12/01/14 1724    PT LONG TERM GOAL #1   Title Patient to have 0 degrees extension  and 130 degrees flexion R knee, pain 0/10   Time 6   Period Weeks   Status On-going   PT LONG TERM GOAL #2   Title Patient will be able to perform single leg squat to floor with R knee, minimal knee valgus, and pain 0/10   Time 6   Period Weeks   Status On-going   PT LONG TERM GOAL #3   Title Patient will be able to perform single leg hops of equal distance with bilateral lower extremities and pain 0/10 R knee    Time 6   Period Weeks   Status On-going   PT LONG TERM GOAL #4   Title Patient to be able to jump off of at least 14 inch box and land on bilateral feet with good technique, good knee positioning with minimal knee valgus, and pain R knee 0/10   Time 6   Period Weeks   Status On-going   PT LONG TERM GOAL #5   Title Patient to be able to maintain single leg stance on BOSU for at least 30 seconds with mild to moderate perturbations, pain R knee 0/10   Time 6   Period Weeks   Status On-going   PT LONG TERM GOAL #6   Title Patient will demonstrate the ability to safely ascend and descend full height ladder  and crawl around obstacles with pain R knee 0/10   Time 6   Period Weeks   Status  On-going               Plan - 12/13/14 1735    Clinical Impression Statement Continued functional exercises today; introduced horse quad/hamstring pulls which were significantlly difficult for patinet at this time, likely due to hamstring weakness R LE. Patient tolerated today's session well overall and had no increased pain during or at end of session.    Pt will benefit from skilled therapeutic intervention in order to improve on the following deficits Abnormal gait;Decreased coordination;Decreased range of motion;Difficulty walking;Impaired tone;Decreased safety awareness;Decreased activity tolerance;Pain;Decreased balance;Impaired flexibility;Improper body mechanics;Decreased mobility;Decreased strength;Increased edema;Postural dysfunction   Rehab Potential Excellent   PT Frequency 3x / week   PT Duration 4 weeks   PT Treatment/Interventions ADLs/Self Care Home Management;Cryotherapy;Electrical Stimulation;DME Instruction;Gait training;Stair training;Functional mobility training;Therapeutic activities;Therapeutic exercise;Balance training;Neuromuscular re-education;Patient/family education;Manual techniques;Passive range of motion   PT Next Visit Plan Continue manual to distal quad and knee PRN.  Progress per 6 weeks ACL due to being behind from slow ROM gains.  Add cybex leg press, SLS, vector stance, and other stability/strengthening exeriises per protocol but painfree.    PT Home Exercise Plan global SLR and SAQ given today   Consulted and Agree with Plan of Care Patient        Problem List There are no active problems to display for this patient.   Nedra Hai PT, DPT [phone]  [facility] [address] [place], [place], [address] Phone: [phone]   Fax:  [phone]

## 2014-12-15 ENCOUNTER — Ambulatory Visit (HOSPITAL_COMMUNITY): Payer: Federal, State, Local not specified - PPO | Admitting: Physical Therapy

## 2014-12-15 DIAGNOSIS — Z9889 Other specified postprocedural states: Secondary | ICD-10-CM | POA: Diagnosis not present

## 2014-12-15 DIAGNOSIS — M25661 Stiffness of right knee, not elsewhere classified: Secondary | ICD-10-CM

## 2014-12-15 DIAGNOSIS — R2681 Unsteadiness on feet: Secondary | ICD-10-CM

## 2014-12-15 DIAGNOSIS — M25561 Pain in right knee: Secondary | ICD-10-CM

## 2014-12-15 DIAGNOSIS — R609 Edema, unspecified: Secondary | ICD-10-CM

## 2014-12-15 DIAGNOSIS — R269 Unspecified abnormalities of gait and mobility: Secondary | ICD-10-CM

## 2014-12-15 DIAGNOSIS — R262 Difficulty in walking, not elsewhere classified: Secondary | ICD-10-CM

## 2014-12-15 NOTE — Therapy (Signed)
[facility] [address] [place], [place], [address] Phone: [phone]   Fax:  [phone]  Physical Therapy Treatment  Patient Details  Name: Ariel Parker MRN: [id number] Date of Birth: 1990/07/09 Referring Sabriel Borromeo:  Babs Sciara, MD  Encounter Date: 12/15/2014      PT End of Session - 12/15/14 1729    Visit Number 20   Number of Visits 24   Date for PT Re-Evaluation 12/29/14   Authorization Type [employer] (PROGRESS NOTE MUST BE DONE WEEKLY FOR MILITARY, give to patient each Friday)   Authorization Time Period 10/26/14 to 12/26/14   PT Start Time 1652   PT Stop Time 1736   PT Time Calculation (min) 44 min   Activity Tolerance Patient tolerated treatment well   Behavior During Therapy [facility] for tasks assessed/performed      Past Medical History  Diagnosis Date  . Disruption of anterior cruciate ligament of right knee 09/2014    Past Surgical History  Procedure Laterality Date  . Wisdom tooth extraction    . Knee arthroscopy w/ acl reconstruction Left 07/17/2006  . Knee arthroscopy with anterior cruciate ligament (acl) repair with hamstring graft Right 10/14/2014    Procedure: RIGHT KNEE ARTHROSCOPY WITH ANTERIOR CRUCIATE LIGAMENT (ACL) REPAIR;  Surgeon: Mckinley Jewel, MD;  Location: [facility];  Service: Orthopedics;  Laterality: Right;  . Knee arthroscopy with lateral menisectomy Right 10/14/2014    Procedure: KNEE ARTHROSCOPY WITH LATERAL MENISECTOMY;  Surgeon: Mckinley Jewel, MD;  Location: [facility];  Service: Orthopedics;  Laterality: Right;    There were no vitals filed for this visit.  Visit Diagnosis:  S/P ACL repair  Right knee pain  Knee stiffness, right  Unsteadiness  Difficulty walking  Edema  Abnormality of gait      Subjective Assessment - 12/15/14 1654    Subjective Patient reports that she has been getting a lot of popping in her knee recently; it is not painful but  it has increased in frequency. Felt good after last workout.    Pertinent History Patient was playing soccer, jumped up and landed and heard a pop. ACL surgery was the 21st of July. Things at home have been going OK but at one point MD thought there might have been a blood clot, which did come back negative. Was taken off of CPM due to blood clot concern and to her knowledge is not going to be put back on CPM.    Currently in Pain? No/denies                         [facility] Adult PT Treatment/Exercise - 12/15/14 0001    Knee/Hip Exercises: Stretches   Active Hamstring Stretch 3 reps;30 seconds   Active Hamstring Stretch Limitations 12 inch box    Quad Stretch Right;4 reps;30 seconds   Quad Stretch Limitations prone with rope   Knee: Self-Stretch to increase Flexion Other (comment)   Knee: Self-Stretch Limitations 12 inch step    Gastroc Stretch Both;3 reps;30 seconds   Gastroc Stretch Limitations slantboard    Knee/Hip Exercises: Aerobic   Elliptical 8 minutes forward level 1   Knee/Hip Exercises: Standing   Heel Raises Right;1 set;20 reps   Step Down Right;15 reps   Step Down Limitations 4 inch box    Wall Squat --   Wall Squat Limitations --   Lunge Walking - Round Trips Press photographer Limitations x20 AP  and lateral no HHA    Other Standing Knee Exercises Hip ABD walks 2x63ft; hamstring and quad pulls on horse 1ft    Other Standing Knee Exercises 2 round trips of approx 141ft  with light sports cord rseistance                 PT Education - 12/15/14 1728    Education provided Yes   Education Details educated that popping in knee may be scar tissue breaking up; encouraged to keep working on quad flexibility at home    Person(s) Educated Patient   Methods Explanation   Comprehension Verbalized understanding          PT Short Term Goals - 12/01/14 1721    PT SHORT TERM GOAL #1   Title Patient will demonstrate R knee ROM of 0-120 degrees with no  pain   Baseline 9/7- continues to be limited by quad contracture but this is improving    Time 3   Period Weeks   Status On-going   PT SHORT TERM GOAL #2   Title Patient will demonstrate at least 4-/5 strength in R knee and at least 4+/5 strength in L LE    Time 3   Period Weeks   Status On-going   PT SHORT TERM GOAL #3   Title Patient to be ambulating unlimited distances without crutches and WBAT R LE, equal step lengths, equal weight bearing each side, minimal unsteadiness, pain no more than 1/10   Baseline 9/7- continues to have signficant soreness with gait without AD, pain 5-6/10   Time 3   Period Weeks   Status On-going   PT SHORT TERM GOAL #4   Title Patient will experience 0/10 pain R knee  with all functional weight bearing tasks and exercises    Time 3   Period Weeks   Status On-going   PT SHORT TERM GOAL #5   Title Patient to be independent in consistently and correctly performing  appropriate HEP, to be updated PRN    Time 3   Period Weeks   Status Achieved           PT Long Term Goals - 12/01/14 1724    PT LONG TERM GOAL #1   Title Patient to have 0 degrees extension  and 130 degrees flexion R knee, pain 0/10   Time 6   Period Weeks   Status On-going   PT LONG TERM GOAL #2   Title Patient will be able to perform single leg squat to floor with R knee, minimal knee valgus, and pain 0/10   Time 6   Period Weeks   Status On-going   PT LONG TERM GOAL #3   Title Patient will be able to perform single leg hops of equal distance with bilateral lower extremities and pain 0/10 R knee    Time 6   Period Weeks   Status On-going   PT LONG TERM GOAL #4   Title Patient to be able to jump off of at least 14 inch box and land on bilateral feet with good technique, good knee positioning with minimal knee valgus, and pain R knee 0/10   Time 6   Period Weeks   Status On-going   PT LONG TERM GOAL #5   Title Patient to be able to maintain single leg stance on BOSU for at  least 30 seconds with mild to moderate perturbations, pain R knee 0/10   Time 6   Period Weeks  Status On-going   PT LONG TERM GOAL #6   Title Patient will demonstrate the ability to safely ascend and descend full height ladder and crawl around obstacles with pain R knee 0/10   Time 6   Period Weeks   Status On-going               Plan - 12/15/14 1730    Clinical Impression Statement Continued functional exercises today; introduced walking with sports cord resistance today, otherwise continued established exercise program. Patient tolerated today's session well but does have pain free popping in R LE, which could possibly be scar tissue from quad contracture continuing to break up.  Educated patient that we are discharging most of quad stretching/massage/heat/etc to her at home as she is very compliant with HEP and PT advice, although PT will continue to check in on progress of quad range.    Pt will benefit from skilled therapeutic intervention in order to improve on the following deficits Abnormal gait;Decreased coordination;Decreased range of motion;Difficulty walking;Impaired tone;Decreased safety awareness;Decreased activity tolerance;Pain;Decreased balance;Impaired flexibility;Improper body mechanics;Decreased mobility;Decreased strength;Increased edema;Postural dysfunction   Rehab Potential Excellent   PT Frequency 3x / week   PT Duration 4 weeks   PT Treatment/Interventions ADLs/Self Care Home Management;Cryotherapy;Electrical Stimulation;DME Instruction;Gait training;Stair training;Functional mobility training;Therapeutic activities;Therapeutic exercise;Balance training;Neuromuscular re-education;Patient/family education;Manual techniques;Passive range of motion   PT Next Visit Plan Continue manual to distal quad and knee PRN.  Progress per 6 weeks ACL due to being behind from slow ROM gains.  Add cybex leg press, SLS, vector stance, and other stability/strengthening exeriises per  protocol but painfree.  Check R knee ROM/quad flexiblity next session.    PT Home Exercise Plan global SLR and SAQ given today   Consulted and Agree with Plan of Care Patient        Problem List There are no active problems to display for this patient.   Nedra Hai PT, DPT [phone]  [facility] [address] [place], [place], [address] Phone: [phone]   Fax:  [phone]

## 2014-12-16 ENCOUNTER — Ambulatory Visit (HOSPITAL_COMMUNITY): Payer: Federal, State, Local not specified - PPO | Admitting: Physical Therapy

## 2014-12-16 DIAGNOSIS — M25561 Pain in right knee: Secondary | ICD-10-CM

## 2014-12-16 DIAGNOSIS — R2681 Unsteadiness on feet: Secondary | ICD-10-CM

## 2014-12-16 DIAGNOSIS — R269 Unspecified abnormalities of gait and mobility: Secondary | ICD-10-CM

## 2014-12-16 DIAGNOSIS — R262 Difficulty in walking, not elsewhere classified: Secondary | ICD-10-CM

## 2014-12-16 DIAGNOSIS — Z9889 Other specified postprocedural states: Secondary | ICD-10-CM

## 2014-12-16 DIAGNOSIS — M25661 Stiffness of right knee, not elsewhere classified: Secondary | ICD-10-CM

## 2014-12-16 DIAGNOSIS — R609 Edema, unspecified: Secondary | ICD-10-CM

## 2014-12-16 NOTE — Therapy (Signed)
[place] [facility] [address] [place], [place], [address] Phone: [phone]   Fax:  [phone]  Physical Therapy Treatment  Patient Details  Name: Ariel Parker MRN: [id number] Date of Birth: June 21, 1990 Referring Lawarence Meek:  Babs Sciara, MD  Encounter Date: 12/16/2014      PT End of Session - 12/16/14 1733    Visit Number 21   Number of Visits 24   Date for PT Re-Evaluation 12/29/14   Authorization Type [employer] (PROGRESS NOTE MUST BE DONE WEEKLY FOR MILITARY, give to patient each Friday)   Authorization Time Period 10/26/14 to 12/26/14   PT Start Time 1653   PT Stop Time 1735   PT Time Calculation (min) 42 min   Activity Tolerance Patient tolerated treatment well   Behavior During Therapy [facility] for tasks assessed/performed      Past Medical History  Diagnosis Date  . Disruption of anterior cruciate ligament of right knee 09/2014    Past Surgical History  Procedure Laterality Date  . Wisdom tooth extraction    . Knee arthroscopy w/ acl reconstruction Left 07/17/2006  . Knee arthroscopy with anterior cruciate ligament (acl) repair with hamstring graft Right 10/14/2014    Procedure: RIGHT KNEE ARTHROSCOPY WITH ANTERIOR CRUCIATE LIGAMENT (ACL) REPAIR;  Surgeon: Mckinley Jewel, MD;  Location: [facility];  Service: Orthopedics;  Laterality: Right;  . Knee arthroscopy with lateral menisectomy Right 10/14/2014    Procedure: KNEE ARTHROSCOPY WITH LATERAL MENISECTOMY;  Surgeon: Mckinley Jewel, MD;  Location: [facility];  Service: Orthopedics;  Laterality: Right;    There were no vitals filed for this visit.  Visit Diagnosis:  S/P ACL repair  Right knee pain  Knee stiffness, right  Unsteadiness  Difficulty walking  Edema  Abnormality of gait      Subjective Assessment - 12/16/14 1654    Subjective Patient reports that she was very sore after last session however was feeling just fine this morning.     Pertinent History Patient was playing soccer, jumped up and landed and heard a pop. ACL surgery was the 21st of July. Things at home have been going OK but at one point MD thought there might have been a blood clot, which did come back negative. Was taken off of CPM due to blood clot concern and to her knowledge is not going to be put back on CPM.    Currently in Pain? No/denies            [facility] PT Assessment - 12/16/14 0001    AROM   Right Knee Extension 2   Right Knee Flexion 111                     [facility] Adult PT Treatment/Exercise - 12/16/14 0001    Knee/Hip Exercises: Stretches   Active Hamstring Stretch 3 reps;30 seconds   Active Hamstring Stretch Limitations 12 inch box    Quad Stretch Right;4 reps;30 seconds   Quad Stretch Limitations prone with rope   Knee: Self-Stretch to increase Flexion Other (comment)   Knee: Self-Stretch Limitations 12 inch step    Gastroc Stretch Both;3 reps;30 seconds   Gastroc Stretch Limitations slantboard    Knee/Hip Exercises: Aerobic   Elliptical 8 minutes forward level 1   Knee/Hip Exercises: Standing   Forward Step Up Right;1 set;10 reps   Forward Step Up Limitations 8 inch box    Lunge Walking - Round Trips Press photographer Limitations x20  AP and lateral no HHA    Other Standing Knee Exercises Hip ABD walks 2x83ft; 2 round trips of walks with light resistance band 124ft    Other Standing Knee Exercises sled pushing 72ftx3 with PT standingon sled plus 30# in weights                  PT Education - 12/16/14 1733    Education provided Yes   Education Details current ROM          PT Short Term Goals - 12/01/14 1721    PT SHORT TERM GOAL #1   Title Patient will demonstrate R knee ROM of 0-120 degrees with no pain   Baseline 9/7- continues to be limited by quad contracture but this is improving    Time 3   Period Weeks   Status On-going   PT SHORT TERM GOAL #2   Title Patient will demonstrate at least  4-/5 strength in R knee and at least 4+/5 strength in L LE    Time 3   Period Weeks   Status On-going   PT SHORT TERM GOAL #3   Title Patient to be ambulating unlimited distances without crutches and WBAT R LE, equal step lengths, equal weight bearing each side, minimal unsteadiness, pain no more than 1/10   Baseline 9/7- continues to have signficant soreness with gait without AD, pain 5-6/10   Time 3   Period Weeks   Status On-going   PT SHORT TERM GOAL #4   Title Patient will experience 0/10 pain R knee  with all functional weight bearing tasks and exercises    Time 3   Period Weeks   Status On-going   PT SHORT TERM GOAL #5   Title Patient to be independent in consistently and correctly performing  appropriate HEP, to be updated PRN    Time 3   Period Weeks   Status Achieved           PT Long Term Goals - 12/01/14 1724    PT LONG TERM GOAL #1   Title Patient to have 0 degrees extension  and 130 degrees flexion R knee, pain 0/10   Time 6   Period Weeks   Status On-going   PT LONG TERM GOAL #2   Title Patient will be able to perform single leg squat to floor with R knee, minimal knee valgus, and pain 0/10   Time 6   Period Weeks   Status On-going   PT LONG TERM GOAL #3   Title Patient will be able to perform single leg hops of equal distance with bilateral lower extremities and pain 0/10 R knee    Time 6   Period Weeks   Status On-going   PT LONG TERM GOAL #4   Title Patient to be able to jump off of at least 14 inch box and land on bilateral feet with good technique, good knee positioning with minimal knee valgus, and pain R knee 0/10   Time 6   Period Weeks   Status On-going   PT LONG TERM GOAL #5   Title Patient to be able to maintain single leg stance on BOSU for at least 30 seconds with mild to moderate perturbations, pain R knee 0/10   Time 6   Period Weeks   Status On-going   PT LONG TERM GOAL #6   Title Patient will demonstrate the ability to safely ascend  and descend full height ladder and crawl around obstacles  with pain R knee 0/10   Time 6   Period Weeks   Status On-going               Plan - 12/16/14 1733    Clinical Impression Statement Continued functional exercises today; introduced sled pushes and pulls today for increased strengthening, otherwise continued established exercises. Patient tolerated session well today however ROM remains at approx 2-111 degrees; will monitor regularly and re-introduce quad work into PT program if needed.    Pt will benefit from skilled therapeutic intervention in order to improve on the following deficits Abnormal gait;Decreased coordination;Decreased range of motion;Difficulty walking;Impaired tone;Decreased safety awareness;Decreased activity tolerance;Pain;Decreased balance;Impaired flexibility;Improper body mechanics;Decreased mobility;Decreased strength;Increased edema;Postural dysfunction   Rehab Potential Excellent   PT Frequency 3x / week   PT Treatment/Interventions ADLs/Self Care Home Management;Cryotherapy;Electrical Stimulation;DME Instruction;Gait training;Stair training;Functional mobility training;Therapeutic activities;Therapeutic exercise;Balance training;Neuromuscular re-education;Patient/family education;Manual techniques;Passive range of motion   PT Next Visit Plan Continue manual to distal quad and knee PRN.  Progress per 6 weeks ACL due to being behind from slow ROM gains.  Add cybex leg press, SLS, vector stance, and other stability/strengthening exeriises per protocol but painfree.  Check R knee ROM/quad flexiblity next session.    PT Home Exercise Plan global SLR and SAQ given today   Consulted and Agree with Plan of Care Patient        Problem List There are no active problems to display for this patient.   Nedra Hai PT, DPT [phone]  [facility] [address] [place], [place], [address] Phone: [phone]   Fax:   [phone]

## 2014-12-17 ENCOUNTER — Encounter (HOSPITAL_COMMUNITY)

## 2014-12-20 ENCOUNTER — Ambulatory Visit (HOSPITAL_COMMUNITY): Payer: Federal, State, Local not specified - PPO | Admitting: Physical Therapy

## 2014-12-20 DIAGNOSIS — R609 Edema, unspecified: Secondary | ICD-10-CM

## 2014-12-20 DIAGNOSIS — Z9889 Other specified postprocedural states: Secondary | ICD-10-CM

## 2014-12-20 DIAGNOSIS — R262 Difficulty in walking, not elsewhere classified: Secondary | ICD-10-CM

## 2014-12-20 DIAGNOSIS — M25661 Stiffness of right knee, not elsewhere classified: Secondary | ICD-10-CM

## 2014-12-20 DIAGNOSIS — R269 Unspecified abnormalities of gait and mobility: Secondary | ICD-10-CM

## 2014-12-20 DIAGNOSIS — M25561 Pain in right knee: Secondary | ICD-10-CM

## 2014-12-20 DIAGNOSIS — R2681 Unsteadiness on feet: Secondary | ICD-10-CM

## 2014-12-20 NOTE — Therapy (Signed)
[facility] [address] [place], [place], [address] Phone: [phone]   Fax:  [phone]  Physical Therapy Treatment  Patient Details  Name: Ariel Parker MRN: [id number] Date of Birth: May 19, 1990 Referring Jjesus Dingley:  Loreta Ave, MD  Encounter Date: 12/20/2014      PT End of Session - 12/20/14 1740    Visit Number 22   Number of Visits 24   Date for PT Re-Evaluation 12/29/14   Authorization Type [employer] (PROGRESS NOTE MUST BE DONE WEEKLY FOR MILITARY, give to patient each Friday)   Authorization Time Period 10/26/14 to 12/26/14   PT Start Time 1645   PT Stop Time 1732   PT Time Calculation (min) 47 min   Activity Tolerance Patient tolerated treatment well   Behavior During Therapy [facility] for tasks assessed/performed      Past Medical History  Diagnosis Date  . Disruption of anterior cruciate ligament of right knee 09/2014    Past Surgical History  Procedure Laterality Date  . Wisdom tooth extraction    . Knee arthroscopy w/ acl reconstruction Left 07/17/2006  . Knee arthroscopy with anterior cruciate ligament (acl) repair with hamstring graft Right 10/14/2014    Procedure: RIGHT KNEE ARTHROSCOPY WITH ANTERIOR CRUCIATE LIGAMENT (ACL) REPAIR;  Surgeon: Mckinley Jewel, MD;  Location: [facility];  Service: Orthopedics;  Laterality: Right;  . Knee arthroscopy with lateral menisectomy Right 10/14/2014    Procedure: KNEE ARTHROSCOPY WITH LATERAL MENISECTOMY;  Surgeon: Mckinley Jewel, MD;  [facility];  Service: Orthopedics;  Laterality: Right;    There were no vitals filed for this visit.  Visit Diagnosis:  S/P ACL repair  Right knee pain  Knee stiffness, right  Unsteadiness  Difficulty walking  Edema  Abnormality of gait      Subjective Assessment - 12/20/14 1739    Subjective Pt states she is concerned about her motion and wish she could get those last degrees.  Pt states seh  was sore after last session.    Currently in Pain? No/denies                         [facility] Adult PT Treatment/Exercise - 12/20/14 0001    Knee/Hip Exercises: Stretches   Active Hamstring Stretch 3 reps;30 seconds   Active Hamstring Stretch Limitations 12 inch box    Quad Stretch Right;4 reps;30 seconds   Quad Stretch Limitations prone with rope   Knee: Self-Stretch to increase Flexion Other (comment)   Knee: Self-Stretch Limitations 12 inch step    Gastroc Stretch Both;3 reps;30 seconds   Gastroc Stretch Limitations slantboard    Knee/Hip Exercises: Aerobic   Elliptical 5 minutes forward level 1   Tread Mill 5 minutes 3.52mph   Knee/Hip Exercises: Standing   Lateral Step Up Right;1 set;10 reps;Step Height: 8"   Lateral Step Up Limitations 8" no HHA   Forward Step Up Right;1 set;10 reps   Forward Step Up Limitations 8 inch box    Functional Squat 10 reps;Limitations   Functional Squat Limitations single; Rt LE only   Wall Squat 5 reps   Wall Squat Limitations 15 second holds   Lunge Walking - Round Trips 2RT   Gait Training single leg balance reach 10 reps each on 2"                  PT Short Term Goals - 12/01/14 1721    PT SHORT TERM GOAL #  1   Title Patient will demonstrate R knee ROM of 0-120 degrees with no pain   Baseline 9/7- continues to be limited by quad contracture but this is improving    Time 3   Period Weeks   Status On-going   PT SHORT TERM GOAL #2   Title Patient will demonstrate at least 4-/5 strength in R knee and at least 4+/5 strength in L LE    Time 3   Period Weeks   Status On-going   PT SHORT TERM GOAL #3   Title Patient to be ambulating unlimited distances without crutches and WBAT R LE, equal step lengths, equal weight bearing each side, minimal unsteadiness, pain no more than 1/10   Baseline 9/7- continues to have signficant soreness with gait without AD, pain 5-6/10   Time 3   Period Weeks   Status On-going   PT SHORT  TERM GOAL #4   Title Patient will experience 0/10 pain R knee  with all functional weight bearing tasks and exercises    Time 3   Period Weeks   Status On-going   PT SHORT TERM GOAL #5   Title Patient to be independent in consistently and correctly performing  appropriate HEP, to be updated PRN    Time 3   Period Weeks   Status Achieved           PT Long Term Goals - 12/01/14 1724    PT LONG TERM GOAL #1   Title Patient to have 0 degrees extension  and 130 degrees flexion R knee, pain 0/10   Time 6   Period Weeks   Status On-going   PT LONG TERM GOAL #2   Title Patient will be able to perform single leg squat to floor with R knee, minimal knee valgus, and pain 0/10   Time 6   Period Weeks   Status On-going   PT LONG TERM GOAL #3   Title Patient will be able to perform single leg hops of equal distance with bilateral lower extremities and pain 0/10 R knee    Time 6   Period Weeks   Status On-going   PT LONG TERM GOAL #4   Title Patient to be able to jump off of at least 14 inch box and land on bilateral feet with good technique, good knee positioning with minimal knee valgus, and pain R knee 0/10   Time 6   Period Weeks   Status On-going   PT LONG TERM GOAL #5   Title Patient to be able to maintain single leg stance on BOSU for at least 30 seconds with mild to moderate perturbations, pain R knee 0/10   Time 6   Period Weeks   Status On-going   PT LONG TERM GOAL #6   Title Patient will demonstrate the ability to safely ascend and descend full height ladder and crawl around obstacles with pain R knee 0/10   Time 6   Period Weeks   Status On-going               Plan - 12/20/14 1743    Clinical Impression Statement Resume quad work per therapist Etheleen Nicks PT).  PRogressed with single leg balance reach exercises today.  PT able to complete all exercises in painfree ROM.  Initiated treadmill to decrease antalgic gait.     PT Next Visit Plan Continue manual to  distal quad and knee PRN.  Progress per 8 weeks ACL due to being behind  from slow ROM gains.  Add cybex leg press and isokinetic workout.  Progress stability/strengthening exeriises per protocol but painfree.  Check R knee ROM/quad flexiblity next session.    Consulted and Agree with Plan of Care Patient        Problem List There are no active problems to display for this patient.   Lurena Nida, PTA/CLT [phone]  12/20/2014, 5:49 PM  [facility] [address] [place], [place], [address] Phone: [phone]   Fax:  [phone]

## 2014-12-22 ENCOUNTER — Ambulatory Visit (HOSPITAL_COMMUNITY): Payer: Federal, State, Local not specified - PPO

## 2014-12-22 DIAGNOSIS — R269 Unspecified abnormalities of gait and mobility: Secondary | ICD-10-CM

## 2014-12-22 DIAGNOSIS — R609 Edema, unspecified: Secondary | ICD-10-CM

## 2014-12-22 DIAGNOSIS — R2681 Unsteadiness on feet: Secondary | ICD-10-CM

## 2014-12-22 DIAGNOSIS — M25661 Stiffness of right knee, not elsewhere classified: Secondary | ICD-10-CM

## 2014-12-22 DIAGNOSIS — R262 Difficulty in walking, not elsewhere classified: Secondary | ICD-10-CM

## 2014-12-22 DIAGNOSIS — M25561 Pain in right knee: Secondary | ICD-10-CM

## 2014-12-22 DIAGNOSIS — Z9889 Other specified postprocedural states: Secondary | ICD-10-CM | POA: Diagnosis not present

## 2014-12-22 NOTE — Therapy (Signed)
[facility] [address] [place], [place], [address] Phone: [phone]   Fax:  [phone]  Physical Therapy Treatment  Patient Details  Name: Ariel Parker MRN: [id number] Date of Birth: 29-Aug-1990 Referring Provider:  Babs Sciara, MD  Encounter Date: 12/22/2014      PT End of Session - 12/22/14 1723    Visit Number 23   Number of Visits 24   Date for PT Re-Evaluation 12/29/14   Authorization Type [employer] (PROGRESS NOTE MUST BE DONE WEEKLY FOR MILITARY, give to patient each Friday)   Authorization Time Period 10/26/14 to 12/26/14   PT Start Time 1650   PT Stop Time 1738   PT Time Calculation (min) 48 min   Activity Tolerance Patient tolerated treatment well   Behavior During Therapy [facility] for tasks assessed/performed      Past Medical History  Diagnosis Date  . Disruption of anterior cruciate ligament of right knee 09/2014    Past Surgical History  Procedure Laterality Date  . Wisdom tooth extraction    . Knee arthroscopy w/ acl reconstruction Left 07/17/2006  . Knee arthroscopy with anterior cruciate ligament (acl) repair with hamstring graft Right 10/14/2014    Procedure: RIGHT KNEE ARTHROSCOPY WITH ANTERIOR CRUCIATE LIGAMENT (ACL) REPAIR;  Surgeon: Mckinley Jewel, MD;  Location: [facility];  Service: Orthopedics;  Laterality: Right;  . Knee arthroscopy with lateral menisectomy Right 10/14/2014    Procedure: KNEE ARTHROSCOPY WITH LATERAL MENISECTOMY;  Surgeon: Mckinley Jewel, MD;  Location: [facility];  Service: Orthopedics;  Laterality: Right;    There were no vitals filed for this visit.  Visit Diagnosis:  S/P ACL repair  Right knee pain  Knee stiffness, right  Unsteadiness  Difficulty walking  Edema  Abnormality of gait      Subjective Assessment - 12/22/14 1653    Subjective No reports of pain, main difficulty with ROM   Currently in Pain? No/denies            [facility] PT  Assessment - 12/22/14 0001    Assessment   Medical Diagnosis s/p R ACL repair    Onset Date/Surgical Date 10/14/14   Next MD Visit [facility] 01/04/2015   AROM   Right Knee Extension 2   Right Knee Flexion 112            [facility] Adult PT Treatment/Exercise - 12/22/14 0001    Exercises   Exercises Knee/Hip   Knee/Hip Exercises: Stretches   Active Hamstring Stretch 3 reps;30 seconds   Active Hamstring Stretch Limitations 12 inch box    Quad Stretch Right;4 reps;30 seconds   Quad Stretch Limitations prone with rope   Knee: Self-Stretch to increase Flexion Other (comment)   Knee: Self-Stretch Limitations 12 inch step knee drives 10x 5"   Gastroc Stretch Both;3 reps;30 seconds   Gastroc Stretch Limitations slantboard    Knee/Hip Exercises: Aerobic   Tread Mill 10 minutes 3.74mph   Knee/Hip Exercises: Machines for Strengthening   Total Gym Leg Press 6Pl 15x   Other Machine [employer] isokinetic 180 <>150 <>135   Knee/Hip Exercises: Standing   Lateral Step Up Right;15 reps;Hand Hold: 0;Step Height: 8"   Forward Step Up Right;15 reps;Hand Hold: 0;Step Height: 8"   Functional Squat 10 reps;Limitations   Functional Squat Limitations single; Rt LE only   Wall Squat 5 reps   Wall Squat Limitations 15 second holds   Lunge Walking - Round Trips 2RT   Gait Training single leg  balance reach 10 reps each on 2"   Knee/Hip Exercises: Supine   Heel Slides 10 reps   Heel Slides Limitations 2-112            PT Short Term Goals - 12/01/14 1721    PT SHORT TERM GOAL #1   Title Patient will demonstrate R knee ROM of 0-120 degrees with no pain   Baseline 9/7- continues to be limited by quad contracture but this is improving    Time 3   Period Weeks   Status On-going   PT SHORT TERM GOAL #2   Title Patient will demonstrate at least 4-/5 strength in R knee and at least 4+/5 strength in L LE    Time 3   Period Weeks   Status On-going   PT SHORT TERM GOAL #3   Title Patient to be  ambulating unlimited distances without crutches and WBAT R LE, equal step lengths, equal weight bearing each side, minimal unsteadiness, pain no more than 1/10   Baseline 9/7- continues to have signficant soreness with gait without AD, pain 5-6/10   Time 3   Period Weeks   Status On-going   PT SHORT TERM GOAL #4   Title Patient will experience 0/10 pain R knee  with all functional weight bearing tasks and exercises    Time 3   Period Weeks   Status On-going   PT SHORT TERM GOAL #5   Title Patient to be independent in consistently and correctly performing  appropriate HEP, to be updated PRN    Time 3   Period Weeks   Status Achieved           PT Long Term Goals - 12/01/14 1724    PT LONG TERM GOAL #1   Title Patient to have 0 degrees extension  and 130 degrees flexion R knee, pain 0/10   Time 6   Period Weeks   Status On-going   PT LONG TERM GOAL #2   Title Patient will be able to perform single leg squat to floor with R knee, minimal knee valgus, and pain 0/10   Time 6   Period Weeks   Status On-going   PT LONG TERM GOAL #3   Title Patient will be able to perform single leg hops of equal distance with bilateral lower extremities and pain 0/10 R knee    Time 6   Period Weeks   Status On-going   PT LONG TERM GOAL #4   Title Patient to be able to jump off of at least 14 inch box and land on bilateral feet with good technique, good knee positioning with minimal knee valgus, and pain R knee 0/10   Time 6   Period Weeks   Status On-going   PT LONG TERM GOAL #5   Title Patient to be able to maintain single leg stance on BOSU for at least 30 seconds with mild to moderate perturbations, pain R knee 0/10   Time 6   Period Weeks   Status On-going   PT LONG TERM GOAL #6   Title Patient will demonstrate the ability to safely ascend and descend full height ladder and crawl around obstacles with pain R knee 0/10   Time 6   Period Weeks   Status On-going                Plan - 12/22/14 1730    Clinical Impression Statement Continued session focus of quad and functional strengthening.  Added cybex  leg press and began isokinetic for LE strengthening.  Continued gait training on treadmill to decrease antalgic gait.  Pt able to complete all exercises in painfree ROM, did report she was sore at end of session.  Pt encouraged to apply ice for pain control and to continue working with ROM. AROM 2-112 this session.     PT Next Visit Plan Continue manual to distal quad and knee PRN.  Progress per 8 weeks ACL due to being behind from slow ROM gains.  Progress stability/strengthening exeriises per protocol but painfree.  Check R knee ROM/quad flexiblity next session.         Problem List There are no active problems to display for this patient.  [address] LPTA; CBIS [phone]  Juel Burrow 12/22/2014, 5:44 PM   [facility] [address] [place], [place], [address] Phone: [phone]   Fax:  [phone]

## 2014-12-24 ENCOUNTER — Ambulatory Visit (HOSPITAL_COMMUNITY): Payer: Federal, State, Local not specified - PPO | Admitting: Physical Therapy

## 2014-12-24 ENCOUNTER — Encounter (HOSPITAL_COMMUNITY)

## 2014-12-24 DIAGNOSIS — Z9889 Other specified postprocedural states: Secondary | ICD-10-CM

## 2014-12-24 DIAGNOSIS — M25661 Stiffness of right knee, not elsewhere classified: Secondary | ICD-10-CM

## 2014-12-24 DIAGNOSIS — M25561 Pain in right knee: Secondary | ICD-10-CM

## 2014-12-24 DIAGNOSIS — R2681 Unsteadiness on feet: Secondary | ICD-10-CM

## 2014-12-24 DIAGNOSIS — R269 Unspecified abnormalities of gait and mobility: Secondary | ICD-10-CM

## 2014-12-24 DIAGNOSIS — R262 Difficulty in walking, not elsewhere classified: Secondary | ICD-10-CM

## 2014-12-24 NOTE — Patient Instructions (Signed)
ELASTIC BAND - HAMSTRING CURL  While seated and an elastic band attched to your ankle, bend your knee and draw back your foot.    STEP UP LATERAL  While standing next to a box or raised surface, step up and to the side on to the surface. Both feet should touch the raised surface. Then step down and onto the floor towards the same side that you started from.

## 2014-12-24 NOTE — Therapy (Signed)
[place] [place], [place], [address] Phone: [phone]   Fax:  [phone]  Physical Therapy Treatment  Patient Details  Name: Ariel Parker MRN: [id number] Date of Birth: [birth year] Referring Provider:  Ninetta Lights, MD  Encounter Date: 12/24/2014      PT End of Session - 12/24/14 1358    Visit Number 24   Number of Visits 36   Date for PT Re-Evaluation 01/23/15   Authorization Type [employer]/ [employer] secondary (PROGRESS NOTE MUST BE DONE WEEKLY FOR MILITARY, give to patient each Friday)   Authorization Time Period 12/26/14-02/25/15   PT Start Time 1015   PT Stop Time 1104   PT Time Calculation (min) 49 min   Activity Tolerance Patient tolerated treatment well   Behavior During Therapy [facility] for tasks assessed/performed      Past Medical History  Diagnosis Date  . Disruption of anterior cruciate ligament of right knee 09/2014    Past Surgical History  Procedure Laterality Date  . Wisdom tooth extraction    . Knee arthroscopy w/ acl reconstruction Left 07/17/2006  . Knee arthroscopy with anterior cruciate ligament (acl) repair with hamstring graft Right 10/14/2014    Procedure: RIGHT KNEE ARTHROSCOPY WITH ANTERIOR CRUCIATE LIGAMENT (ACL) REPAIR;  Surgeon: Kathryne Hitch, MD;  Location: [facility];  Service: Orthopedics;  Laterality: Right;  . Knee arthroscopy with lateral menisectomy Right 10/14/2014    Procedure: KNEE ARTHROSCOPY WITH LATERAL MENISECTOMY;  Surgeon: Kathryne Hitch, MD;  Location: [facility];  Service: Orthopedics;  Laterality: Right;    There were no vitals filed for this visit.  Visit Diagnosis:  Knee stiffness, right  Right knee pain  Unsteadiness  Difficulty walking  Abnormality of gait  S/P ACL repair      Subjective Assessment - 12/24/14 1024    Subjective Pt reports that she feels that she is improving, but it has been a slow progression. She reports that  she has had less difficulty with walking, stairs, sitting for extended periods of time, and her movement in general. She reports that she still has weakness in her R leg, and she feels that her ROM is still very limited.  She reports that she had some increased pain in her knee yesteraday, and she has noticed a bit more swelling.    How long can you sit comfortably? no limitations   How long can you stand comfortably? 2-3 hours   How long can you walk comfortably? 2 hours with no AD   Currently in Pain? No/denies   Pain Score 0-No pain            [facility] PT Assessment - 12/24/14 0001    Assessment   Medical Diagnosis s/p R ACL repair    Onset Date/Surgical Date 10/14/14   Next MD Visit [facility] 01/04/2015   Observation/Other Assessments   Focus on Therapeutic Outcomes (FOTO)  35% limited   AROM   Right Knee Extension 3   Right Knee Flexion 116   Strength   Right Hip Flexion 5/5  was 5   Right Hip Extension 4/5  was 4   Right Hip ABduction 5/5  was 4+   Left Hip Flexion 5/5  was 5   Left Hip Extension 4+/5  was 4+   Left Hip ABduction 5/5  was 4+   Right Knee Flexion 4/5  was 4-   Right Knee Extension 4-/5  was 3+   Left Knee Flexion 5/5  was 5   Left Knee Extension 5/5  was 5                     [facility] Adult PT Treatment/Exercise - 12/24/14 0001    Knee/Hip Exercises: Stretches   Active Hamstring Stretch 3 reps;30 seconds   Active Hamstring Stretch Limitations 12 inch box    Quad Stretch Right;4 reps;30 seconds   Quad Stretch Limitations prone with rope   Knee: Self-Stretch to increase Flexion Other (comment)   Knee: Self-Stretch Limitations 12 inch step knee drives 97C 5"   Gastroc Stretch Both;3 reps;30 seconds   Gastroc Stretch Limitations slantboard    Knee/Hip Exercises: Standing   Step Down 2 sets;10 reps;Step Height: 2";Step Height: 4"   Functional Squat 10 reps;Limitations   Functional Squat Limitations single; Rt LE only   Wall Squat  10 reps   Wall Squat Limitations 15 second holds   Knee/Hip Exercises: Seated   Hamstring Curl 15 reps   Hamstring Limitations green tband   Knee/Hip Exercises: Supine   Heel Slides 15 reps   Heel Slides Limitations 3-116   Knee/Hip Exercises: Prone   Contract/Relax to Increase Flexion 10" holds x 5                PT Education - 12/24/14 1223    Education provided Yes   Education Details Goals and progress reviewed, HEP updated   Person(s) Educated Patient   Methods Explanation;Handout   Comprehension Verbalized understanding;Returned demonstration          PT Short Term Goals - 12/24/14 1223    PT SHORT TERM GOAL #1   Title Patient will demonstrate R knee ROM of 0-120 degrees with no pain   Baseline 9/30- continues to be limited by quad contracture. ROM 3-116   Time 3   Period Weeks   Status On-going   PT SHORT TERM GOAL #2   Title Patient will demonstrate at least 4-/5 strength in R knee and at least 4+/5 strength in L LE    Time 3   Period Weeks   Status On-going   PT SHORT TERM GOAL #3   Title Patient to be ambulating unlimited distances without crutches and WBAT R LE, equal step lengths, equal weight bearing each side, minimal unsteadiness, pain no more than 1/10   Baseline 9/30- continues to demonstrate decreased WB on RLE   Time 3   Period Weeks   Status On-going   PT SHORT TERM GOAL #4   Title Patient will experience 0/10 pain R knee  with all functional weight bearing tasks and exercises    Time 3   Period Weeks   Status On-going   PT SHORT TERM GOAL #5   Title Patient to be independent in consistently and correctly performing  appropriate HEP, to be updated PRN    Time 3   Period Weeks   Status Achieved           PT Long Term Goals - 12/24/14 1225    PT LONG TERM GOAL #1   Title Patient to have 0 degrees extension  and 130 degrees flexion R knee, pain 0/10   Time 6   Period Weeks   Status On-going   PT LONG TERM GOAL #2   Title  Patient will be able to perform single leg squat to floor with R knee, minimal knee valgus, and pain 0/10   Time 6   Period Weeks   Status On-going  PT LONG TERM GOAL #3   Title Patient will be able to perform single leg hops of equal distance with bilateral lower extremities and pain 0/10 R knee    Time 6   Period Weeks   Status On-going   PT LONG TERM GOAL #4   Title Patient to be able to jump off of at least 14 inch box and land on bilateral feet with good technique, good knee positioning with minimal knee valgus, and pain R knee 0/10   Time 6   Period Weeks   Status On-going   PT LONG TERM GOAL #5   Title Patient to be able to maintain single leg stance on BOSU for at least 30 seconds with mild to moderate perturbations, pain R knee 0/10   Time 6   Period Weeks   Status On-going   PT LONG TERM GOAL #6   Title Patient will demonstrate the ability to safely ascend and descend full height ladder and crawl around obstacles with pain R knee 0/10   Time 6   Period Weeks   Status Achieved               Plan - 12/24/14 1403    Clinical Impression Statement Reassesssment was completed today. Due to complications following ACL repair that extended the amount of time that pt spent in extension brace, she continues to demonstrate limitations in ROM and strength. At this point in her rehab, pt should ideally be achieving 0-125 degrees of motion, however, due to contracture of R quad, pt's ROM is 3-116 degrees at this time. Pt also demonstrates decreased strength in R quads and hamstrings, which affects her ambulation and her functional mobility. As a result of complications hindering her progress, pt has experienced a setback in her rehab protocol, and has not been able to progress as quickly as predicted.  Pt shows excellent motivation and compliance with her HEP, and is now ambulating without AD and is able to ascend/descend stairs reciprocally. Pt has not met her LTGs at this point, and  continuation of skilled physical therapy is necessary in order to further address pt's ROM and improve her strength in order for her to return to her job serving in [employer]. At this time, it is unsafe for pt to return to her full duties due to decreased strength.   Pt will benefit from skilled therapeutic intervention in order to improve on the following deficits Abnormal gait;Decreased coordination;Decreased range of motion;Difficulty walking;Impaired tone;Decreased safety awareness;Decreased activity tolerance;Pain;Decreased balance;Impaired flexibility;Improper body mechanics;Decreased mobility;Decreased strength;Increased edema;Postural dysfunction   PT Frequency 3x / week   PT Duration 4 weeks   PT Treatment/Interventions ADLs/Self Care Home Management;Cryotherapy;Electrical Stimulation;DME Instruction;Gait training;Stair training;Functional mobility training;Therapeutic activities;Therapeutic exercise;Balance training;Neuromuscular re-education;Patient/family education;Manual techniques;Passive range of motion   PT Next Visit Plan Continue with ROM activities, contract relax stretching to improve flexion, quad and hamstring strengthening       Physical Therapy Progress Note  Dates of Reporting Period: 12/01/14 to 12/24/14  Objective Reports of Subjective Statement: Pt has achieved 2-116 degrees of knee ROM with PT, and demonstrates improved knee strength. Pt's limitations in ROM as result of extended time in knee brace have hindered ability to progress protocol. Pt continues to report some difficulty with ambulation, and also states that her knee feels stiff and weak.   Objective Measurements: see above  Goal Update: see above  Plan: Continue with functional strengthening and ROM activities per protocol.   Reason Skilled Services are Required:  Pt has not met her LTGs at this point, and continuation of skilled physical therapy is necessary in order to further address pt's ROM and  improve her strength in order for her to return to her job serving in [employer]. At this time, it is unsafe for pt to return to her full duties due to decreased strength and ROM.     Problem List There are no active problems to display for this patient.   Hilma Favors, PT, DPT [phone] 12/24/2014, 2:18 PM  [facility] [address] [place], [place], [address] Phone: [phone]   Fax:  [phone]

## 2014-12-27 ENCOUNTER — Ambulatory Visit (HOSPITAL_COMMUNITY): Payer: Federal, State, Local not specified - PPO | Admitting: Physical Therapy

## 2014-12-27 ENCOUNTER — Telehealth (HOSPITAL_COMMUNITY): Payer: Self-pay | Admitting: Physical Therapy

## 2014-12-27 NOTE — Telephone Encounter (Signed)
Car battery is dead, she will come in tomowrrow afternoon

## 2014-12-28 ENCOUNTER — Ambulatory Visit (HOSPITAL_COMMUNITY): Payer: Federal, State, Local not specified - PPO | Attending: Orthopedic Surgery | Admitting: Physical Therapy

## 2014-12-28 DIAGNOSIS — R2681 Unsteadiness on feet: Secondary | ICD-10-CM | POA: Diagnosis present

## 2014-12-28 DIAGNOSIS — R262 Difficulty in walking, not elsewhere classified: Secondary | ICD-10-CM

## 2014-12-28 DIAGNOSIS — R269 Unspecified abnormalities of gait and mobility: Secondary | ICD-10-CM

## 2014-12-28 DIAGNOSIS — M25561 Pain in right knee: Secondary | ICD-10-CM | POA: Insufficient documentation

## 2014-12-28 DIAGNOSIS — M25661 Stiffness of right knee, not elsewhere classified: Secondary | ICD-10-CM | POA: Diagnosis not present

## 2014-12-28 DIAGNOSIS — Z9889 Other specified postprocedural states: Secondary | ICD-10-CM

## 2014-12-28 NOTE — Therapy (Signed)
[facility] [address] [place], [place], [address] Phone: [phone]   Fax:  [phone]  Physical Therapy Treatment  Patient Details  Name: Ariel Parker MRN: [id number] Date of Birth: 10-Mar-1991 Referring Provider:  Loreta Ave, MD  Encounter Date: 12/28/2014      PT End of Session - 12/28/14 1520    Visit Number 25   Number of Visits 36   Date for PT Re-Evaluation 01/23/15   Authorization Type BCBS/ [employer] secondary (PROGRESS NOTE MUST BE DONE WEEKLY FOR MILITARY, give to patient each Friday)   Authorization Time Period 12/26/14-02/25/15   PT Start Time 1433   PT Stop Time 1519   PT Time Calculation (min) 46 min   Activity Tolerance Patient tolerated treatment well   Behavior During Therapy [facility] for tasks assessed/performed      Past Medical History  Diagnosis Date  . Disruption of anterior cruciate ligament of right knee 09/2014    Past Surgical History  Procedure Laterality Date  . Wisdom tooth extraction    . Knee arthroscopy w/ acl reconstruction Left 07/17/2006  . Knee arthroscopy with anterior cruciate ligament (acl) repair with hamstring graft Right 10/14/2014    Procedure: RIGHT KNEE ARTHROSCOPY WITH ANTERIOR CRUCIATE LIGAMENT (ACL) REPAIR;  Surgeon: Mckinley Jewel, MD;  Location: [facility];  Service: Orthopedics;  Laterality: Right;  . Knee arthroscopy with lateral menisectomy Right 10/14/2014    Procedure: KNEE ARTHROSCOPY WITH LATERAL MENISECTOMY;  Surgeon: Mckinley Jewel, MD;  Location: [facility];  Service: Orthopedics;  Laterality: Right;    There were no vitals filed for this visit.  Visit Diagnosis:  Knee stiffness, right  Right knee pain  Unsteadiness  Difficulty walking  Abnormality of gait  S/P ACL repair      Subjective Assessment - 12/28/14 1434    Subjective Patient reports that everything is giong well; had to go to [place] this past weekend and had to do  a LOT of walking after which her knee swole up a bit. It is still a little puffy now.    Pertinent History Patient was playing soccer, jumped up and landed and heard a pop. ACL surgery was the 21st of July. Things at home have been going OK but at one point MD thought there might have been a blood clot, which did come back negative. Was taken off of CPM due to blood clot concern and to her knowledge is not going to be put back on CPM.    Currently in Pain? No/denies                         [facility] Adult PT Treatment/Exercise - 12/28/14 0001    Knee/Hip Exercises: Stretches   Active Hamstring Stretch 3 reps;30 seconds   Active Hamstring Stretch Limitations 14 inch box    Quad Stretch Right;4 reps;30 seconds   Quad Stretch Limitations prone with rope   Knee: Self-Stretch to increase Flexion Other (comment)   Knee: Self-Stretch Limitations 14 inch step knee drives 10x 10"   Gastroc Stretch Both;3 reps;30 seconds   Gastroc Stretch Limitations slantboard    Knee/Hip Exercises: Aerobic   Elliptical 8 minutes forward    Knee/Hip Exercises: Standing   Lateral Step Up Right;1 set;15 reps   Lateral Step Up Limitations 8 inch box    Forward Step Up Right;15 reps;Hand Hold: 0;Step Height: 8"   Functional Squat 10 reps   Functional Squat Limitations  single; Rt LE only; both with and without 3 second holds    Wall Squat 10 reps   Wall Squat Limitations 15 second holds   Lunge Walking - Round Trips 2RT x28ft    Rocker Board Limitations x20 AP and lateral no HHA    Gait Training single leg balance reaches 4 directions 1x10   Other Standing Knee Exercises 3D hip excursions toe touch 1x10   Other Standing Knee Exercises Hip ABD walks 4x33ft; horse x36ft hamstring pulls                 PT Education - 12/28/14 1520    Education provided No          PT Short Term Goals - 12/24/14 1223    PT SHORT TERM GOAL #1   Title Patient will demonstrate R knee ROM of 0-120 degrees  with no pain   Baseline 9/30- continues to be limited by quad contracture. ROM 3-116   Time 3   Period Weeks   Status On-going   PT SHORT TERM GOAL #2   Title Patient will demonstrate at least 4-/5 strength in R knee and at least 4+/5 strength in L LE    Time 3   Period Weeks   Status On-going   PT SHORT TERM GOAL #3   Title Patient to be ambulating unlimited distances without crutches and WBAT R LE, equal step lengths, equal weight bearing each side, minimal unsteadiness, pain no more than 1/10   Baseline 9/30- continues to demonstrate decreased WB on RLE   Time 3   Period Weeks   Status On-going   PT SHORT TERM GOAL #4   Title Patient will experience 0/10 pain R knee  with all functional weight bearing tasks and exercises    Time 3   Period Weeks   Status On-going   PT SHORT TERM GOAL #5   Title Patient to be independent in consistently and correctly performing  appropriate HEP, to be updated PRN    Time 3   Period Weeks   Status Achieved           PT Long Term Goals - 12/24/14 1225    PT LONG TERM GOAL #1   Title Patient to have 0 degrees extension  and 130 degrees flexion R knee, pain 0/10   Time 6   Period Weeks   Status On-going   PT LONG TERM GOAL #2   Title Patient will be able to perform single leg squat to floor with R knee, minimal knee valgus, and pain 0/10   Time 6   Period Weeks   Status On-going   PT LONG TERM GOAL #3   Title Patient will be able to perform single leg hops of equal distance with bilateral lower extremities and pain 0/10 R knee    Time 6   Period Weeks   Status On-going   PT LONG TERM GOAL #4   Title Patient to be able to jump off of at least 14 inch box and land on bilateral feet with good technique, good knee positioning with minimal knee valgus, and pain R knee 0/10   Time 6   Period Weeks   Status On-going   PT LONG TERM GOAL #5   Title Patient to be able to maintain single leg stance on BOSU for at least 30 seconds with mild to  moderate perturbations, pain R knee 0/10   Time 6   Period Weeks   Status On-going  PT LONG TERM GOAL #6   Title Patient will demonstrate the ability to safely ascend and descend full height ladder and crawl around obstacles with pain R knee 0/10   Time 6   Period Weeks   Status Achieved               Plan - 12/28/14 1521    Clinical Impression Statement Continued functional stretching and strengthening today. Patient able to perform all tasks well today however did experience more muscle fatigue than per her norm this session, which she reports might be because her knee is still a little swoellen and tired from walking so much in [place] this weekend. Stretched extra repetitions mid-session due to muscle soreness and fatigue.    Pt will benefit from skilled therapeutic intervention in order to improve on the following deficits Abnormal gait;Decreased coordination;Decreased range of motion;Difficulty walking;Impaired tone;Decreased safety awareness;Decreased activity tolerance;Pain;Decreased balance;Impaired flexibility;Improper body mechanics;Decreased mobility;Decreased strength;Increased edema;Postural dysfunction   Rehab Potential Excellent   PT Frequency 3x / week   PT Duration 4 weeks   PT Treatment/Interventions ADLs/Self Care Home Management;Cryotherapy;Electrical Stimulation;DME Instruction;Gait training;Stair training;Functional mobility training;Therapeutic activities;Therapeutic exercise;Balance training;Neuromuscular re-education;Patient/family education;Manual techniques;Passive range of motion   PT Next Visit Plan  Do 1 RM test; progress patient per protocol if appropriate; Continue with ROM activities, contract relax stretching to improve flexion, quad and hamstring strengthening   PT Home Exercise Plan global SLR and SAQ given today   Consulted and Agree with Plan of Care Patient        Problem List There are no active problems to display for this  patient.   Nedra Hai PT, DPT [phone]  [facility] [address] [place], [place], [address] Phone: [phone]   Fax:  [phone]

## 2014-12-29 ENCOUNTER — Ambulatory Visit (HOSPITAL_COMMUNITY): Payer: Federal, State, Local not specified - PPO

## 2014-12-29 DIAGNOSIS — M25561 Pain in right knee: Secondary | ICD-10-CM

## 2014-12-29 DIAGNOSIS — R262 Difficulty in walking, not elsewhere classified: Secondary | ICD-10-CM

## 2014-12-29 DIAGNOSIS — R269 Unspecified abnormalities of gait and mobility: Secondary | ICD-10-CM

## 2014-12-29 DIAGNOSIS — M25661 Stiffness of right knee, not elsewhere classified: Secondary | ICD-10-CM

## 2014-12-29 DIAGNOSIS — R2681 Unsteadiness on feet: Secondary | ICD-10-CM

## 2014-12-29 DIAGNOSIS — Z9889 Other specified postprocedural states: Secondary | ICD-10-CM

## 2014-12-29 NOTE — Therapy (Signed)
[place] [facility] [address] [place], [place], [address] Phone: [phone]   Fax:  [phone]  Physical Therapy Treatment  Patient Details  Name: Ariel Parker MRN: [id number] Date of Birth: April 04, 1990 Referring Provider:  Loreta Ave, MD  Encounter Date: 12/29/2014      PT End of Session - 12/29/14 1134    Visit Number 26   Number of Visits 36   Date for PT Re-Evaluation 01/23/15   Authorization Type BCBS/ [employer] secondary (PROGRESS NOTE MUST BE DONE WEEKLY FOR MILITARY, give to patient each Friday)   Authorization Time Period 12/26/14-02/25/15   PT Start Time 1105   PT Stop Time 1210   PT Time Calculation (min) 65 min   Activity Tolerance Patient tolerated treatment well   Behavior During Therapy [facility] for tasks assessed/performed      Past Medical History  Diagnosis Date  . Disruption of anterior cruciate ligament of right knee 09/2014    Past Surgical History  Procedure Laterality Date  . Wisdom tooth extraction    . Knee arthroscopy w/ acl reconstruction Left 07/17/2006  . Knee arthroscopy with anterior cruciate ligament (acl) repair with hamstring graft Right 10/14/2014    Procedure: RIGHT KNEE ARTHROSCOPY WITH ANTERIOR CRUCIATE LIGAMENT (ACL) REPAIR;  Surgeon: Mckinley Jewel, MD;  Location: [facility];  Service: Orthopedics;  Laterality: Right;  . Knee arthroscopy with lateral menisectomy Right 10/14/2014    Procedure: KNEE ARTHROSCOPY WITH LATERAL MENISECTOMY;  Surgeon: Mckinley Jewel, MD;  Location: [facility];  Service: Orthopedics;  Laterality: Right;    There were no vitals filed for this visit.  Visit Diagnosis:  Knee stiffness, right  Right knee pain  Unsteadiness  Difficulty walking  Abnormality of gait  S/P ACL repair      Subjective Assessment - 12/29/14 1133    Subjective Pt stated she had increased soreness following last session, no reports of pain today.   Currently  in Pain? No/denies             [facility] Adult PT Treatment/Exercise - 12/29/14 0001    Knee/Hip Exercises: Machines for Strengthening   Cybex Knee Extension 1 rep max Rt 4Pl (36), Lt 7OPl (70#   Cybex Knee Flexion 1 Rep max Rt 4 1/2 Pl (40 1/2#)), Lt 7 Pl (70#)   Knee/Hip Exercises: Standing   Lateral Step Up Right;1 set;15 reps;Hand Hold: 0;Step Height: 8"   Lateral Step Up Limitations 8 inch box    Forward Step Up Right;15 reps;Hand Hold: 0;Step Height: 8"   Forward Step Up Limitations 8 inch box    Step Down 15 reps;Hand Hold: 0;Step Height: 4"   Wall Squat 10 reps   Wall Squat Limitations 15 second holds   Lunge Walking - Round Trips 2RT x43ft    Knee/Hip Exercises: Seated   Stool Scoot - Round Trips 30 feet x 2 Rt LE only   Knee/Hip Exercises: Supine   Heel Slides 5 sets   Heel Slides Limitations 2-116   Manual Therapy   Manual Therapy Edema management;Soft tissue mobilization   Edema Management Retro massage with LE elevated for edema control   Soft tissue mobilization soft tissues mobilization R quad            PT Education - 12/28/14 1520    Education provided No          PT Short Term Goals - 12/24/14 1223    PT SHORT TERM GOAL #1  Title Patient will demonstrate R knee ROM of 0-120 degrees with no pain   Baseline 9/30- continues to be limited by quad contracture. ROM 3-116   Time 3   Period Weeks   Status On-going   PT SHORT TERM GOAL #2   Title Patient will demonstrate at least 4-/5 strength in R knee and at least 4+/5 strength in L LE    Time 3   Period Weeks   Status On-going   PT SHORT TERM GOAL #3   Title Patient to be ambulating unlimited distances without crutches and WBAT R LE, equal step lengths, equal weight bearing each side, minimal unsteadiness, pain no more than 1/10   Baseline 9/30- continues to demonstrate decreased WB on RLE   Time 3   Period Weeks   Status On-going   PT SHORT TERM GOAL #4   Title Patient will experience 0/10 pain  R knee  with all functional weight bearing tasks and exercises    Time 3   Period Weeks   Status On-going   PT SHORT TERM GOAL #5   Title Patient to be independent in consistently and correctly performing  appropriate HEP, to be updated PRN    Time 3   Period Weeks   Status Achieved           PT Long Term Goals - 12/24/14 1225    PT LONG TERM GOAL #1   Title Patient to have 0 degrees extension  and 130 degrees flexion R knee, pain 0/10   Time 6   Period Weeks   Status On-going   PT LONG TERM GOAL #2   Title Patient will be able to perform single leg squat to floor with R knee, minimal knee valgus, and pain 0/10   Time 6   Period Weeks   Status On-going   PT LONG TERM GOAL #3   Title Patient will be able to perform single leg hops of equal distance with bilateral lower extremities and pain 0/10 R knee    Time 6   Period Weeks   Status On-going   PT LONG TERM GOAL #4   Title Patient to be able to jump off of at least 14 inch box and land on bilateral feet with good technique, good knee positioning with minimal knee valgus, and pain R knee 0/10   Time 6   Period Weeks   Status On-going   PT LONG TERM GOAL #5   Title Patient to be able to maintain single leg stance on BOSU for at least 30 seconds with mild to moderate perturbations, pain R knee 0/10   Time 6   Period Weeks   Status On-going   PT LONG TERM GOAL #6   Title Patient will demonstrate the ability to safely ascend and descend full height ladder and crawl around obstacles with pain R knee 0/10   Time 6   Period Weeks   Status Achieved               Plan - 12/29/14 1136    Clinical Impression Statement Noted increased edema this session with decreased ROM especially with extension.  1 rep max complete this session with quad strength at 48% and hamstrings at 57%.  Session focus on functional strengthening and stretches to improve ROM.  Ended session with retro massage to reduce edema with improved ROM noted  (AROM 2-116, PROM 1-118).  No reports of pain through session.   PT Next Visit Plan Pt 10  weeks post-op progress patient per protocol if appropriate (ROM currently at 2/116).  Complete progress note prior MD apt in 2 more sessions (apt scheduled for 01/04/2015) ; Continue with ROM activities, contract relax stretching to improve flexion, quad and hamstring strengthening        Problem List There are no active problems to display for this patient.  [address], LPTA; CBIS [phone]  Juel Burrow 12/29/2014, 12:50 PM  [place] [facility] [address] [place], [place], [address] Phone: [phone]   Fax:  [phone]

## 2014-12-31 ENCOUNTER — Ambulatory Visit (HOSPITAL_COMMUNITY): Payer: Federal, State, Local not specified - PPO

## 2014-12-31 DIAGNOSIS — M25561 Pain in right knee: Secondary | ICD-10-CM

## 2014-12-31 DIAGNOSIS — Z9889 Other specified postprocedural states: Secondary | ICD-10-CM

## 2014-12-31 DIAGNOSIS — R262 Difficulty in walking, not elsewhere classified: Secondary | ICD-10-CM

## 2014-12-31 DIAGNOSIS — M25661 Stiffness of right knee, not elsewhere classified: Secondary | ICD-10-CM | POA: Diagnosis not present

## 2014-12-31 DIAGNOSIS — R2681 Unsteadiness on feet: Secondary | ICD-10-CM

## 2014-12-31 DIAGNOSIS — R269 Unspecified abnormalities of gait and mobility: Secondary | ICD-10-CM

## 2014-12-31 NOTE — Therapy (Signed)
[place], [place], [address] Phone: [phone]   Fax:  [phone]  Physical Therapy Treatment  Patient Details  Name: Ariel Parker MRN: [id number] Date of Birth: Jun 27, 1990 Referring Provider:  Ninetta Lights, MD  Encounter Date: 12/31/2014      PT End of Session - 12/31/14 1350    Visit Number 27   Number of Visits 36   Date for PT Re-Evaluation 01/23/15   Authorization Type BCBS/ [employer] secondary (PROGRESS NOTE MUST BE DONE WEEKLY FOR MILITARY, give to patient each Friday)   Authorization Time Period 12/26/14-02/25/15   PT Start Time 1300   PT Stop Time 1405   PT Time Calculation (min) 65 min   Activity Tolerance Patient tolerated treatment well   Behavior During Therapy [facility] for tasks assessed/performed      Past Medical History  Diagnosis Date  . Disruption of anterior cruciate ligament of right knee 09/2014    Past Surgical History  Procedure Laterality Date  . Wisdom tooth extraction    . Knee arthroscopy w/ acl reconstruction Left 07/17/2006  . Knee arthroscopy with anterior cruciate ligament (acl) repair with hamstring graft Right 10/14/2014    Procedure: RIGHT KNEE ARTHROSCOPY WITH ANTERIOR CRUCIATE LIGAMENT (ACL) REPAIR;  Surgeon: Kathryne Hitch, MD;  Location: [facility];  Service: Orthopedics;  Laterality: Right;  . Knee arthroscopy with lateral menisectomy Right 10/14/2014    Procedure: KNEE ARTHROSCOPY WITH LATERAL MENISECTOMY;  Surgeon: Kathryne Hitch, MD;  Location: [facility];  Service: Orthopedics;  Laterality: Right;    There were no vitals filed for this visit.  Visit Diagnosis:  Knee stiffness, right  Right knee pain  Unsteadiness  Abnormality of gait  S/P ACL repair  Difficulty walking      Subjective Assessment - 12/31/14 1305    Subjective Pt stated knee is feeling good today, no reports of pain today   Currently in Pain? No/denies            [facility] Adult PT Treatment/Exercise - 12/31/14 0001    Exercises   Exercises Knee/Hip   Knee/Hip Exercises: Stretches   Active Hamstring Stretch 3 reps;30 seconds   Quad Stretch Right;4 reps;30 seconds   Quad Stretch Limitations prone with rope   Knee: Self-Stretch to increase Flexion Other (comment)   Knee: Self-Stretch Limitations 14 inch step knee drives 00Q 10"   Gastroc Stretch Both;3 reps;30 seconds   Gastroc Stretch Limitations slantboard    Knee/Hip Exercises: Aerobic   Tread Mill 8 minutes 2.8 mph   Knee/Hip Exercises: Standing   Lateral Step Up Right;1 set;15 reps;Hand Hold: 0;Step Height: 8"   Lateral Step Up Limitations 8 inch box    Forward Step Up Right;15 reps;Hand Hold: 0;Step Height: 8"   Forward Step Up Limitations 8 inch box    Step Down 15 reps;Step Height: 6";Hand Hold: 1   Functional Squat 10 reps;5 seconds   Functional Squat Limitations Lt toe tapping in front of mat    Lunge Walking - Round Trips 2RT x61f    Gait Training single leg balance reaches 4 directions 1x10   Knee/Hip Exercises: Supine   Heel Slides 10 reps   Heel Slides Limitations 1-117   Knee Extension PROM   Knee Flexion PROM   Knee/Hip Exercises: Prone   Other Prone Exercises PROM for flexion   Other Prone Exercises TKE 10x 5":           PT Short Term  Goals - 12/24/14 1223    PT SHORT TERM GOAL #1   Title Patient will demonstrate R knee ROM of 0-120 degrees with no pain   Baseline 9/30- continues to be limited by quad contracture. ROM 3-116   Time 3   Period Weeks   Status On-going   PT SHORT TERM GOAL #2   Title Patient will demonstrate at least 4-/5 strength in R knee and at least 4+/5 strength in L LE    Time 3   Period Weeks   Status On-going   PT SHORT TERM GOAL #3   Title Patient to be ambulating unlimited distances without crutches and WBAT R LE, equal step lengths, equal weight bearing each side, minimal unsteadiness, pain no more than 1/10   Baseline 9/30-  continues to demonstrate decreased WB on RLE   Time 3   Period Weeks   Status On-going   PT SHORT TERM GOAL #4   Title Patient will experience 0/10 pain R knee  with all functional weight bearing tasks and exercises    Time 3   Period Weeks   Status On-going   PT SHORT TERM GOAL #5   Title Patient to be independent in consistently and correctly performing  appropriate HEP, to be updated PRN    Time 3   Period Weeks   Status Achieved           PT Long Term Goals - 12/24/14 1225    PT LONG TERM GOAL #1   Title Patient to have 0 degrees extension  and 130 degrees flexion R knee, pain 0/10   Time 6   Period Weeks   Status On-going   PT LONG TERM GOAL #2   Title Patient will be able to perform single leg squat to floor with R knee, minimal knee valgus, and pain 0/10   Time 6   Period Weeks   Status On-going   PT LONG TERM GOAL #3   Title Patient will be able to perform single leg hops of equal distance with bilateral lower extremities and pain 0/10 R knee    Time 6   Period Weeks   Status On-going   PT LONG TERM GOAL #4   Title Patient to be able to jump off of at least 14 inch box and land on bilateral feet with good technique, good knee positioning with minimal knee valgus, and pain R knee 0/10   Time 6   Period Weeks   Status On-going   PT LONG TERM GOAL #5   Title Patient to be able to maintain single leg stance on BOSU for at least 30 seconds with mild to moderate perturbations, pain R knee 0/10   Time 6   Period Weeks   Status On-going   PT LONG TERM GOAL #6   Title Patient will demonstrate the ability to safely ascend and descend full height ladder and crawl around obstacles with pain R knee 0/10   Time 6   Period Weeks   Status Achieved               Plan - 12/31/14 1350    Clinical Impression Statement Session focus on improving quad strengthening for functional strengthening and improving knee extension.  Progressed to SLS activities including SLS  squats (Lt toe touching), single leg balance reach and increased step height with step down training with min cueing for eccentric control for strengthening.  Pt improving ROM 1-117 degrees.     PT Next  Visit Plan Pt 10 weeks post-op progress patient per protocol if appropriate (ROM currently 1-117 and quad strength at 48% not quite met the criteria for phase III.  Complete progress note prior MD apt in next session (apt scheduled for 01/04/2015) ; Continue with ROM activities, contract relax stretching to improve flexion, quad and hamstring strengthening        Problem List There are no active problems to display for this patient.  [address] LPTA; [place]  Aldona Lento 12/31/2014, 2:10 PM  [place], [place], [address] Phone: [phone]   Fax:  [phone]

## 2015-01-03 ENCOUNTER — Telehealth (HOSPITAL_COMMUNITY): Payer: Self-pay

## 2015-01-03 ENCOUNTER — Ambulatory Visit (HOSPITAL_COMMUNITY): Payer: Federal, State, Local not specified - PPO

## 2015-01-03 DIAGNOSIS — M25561 Pain in right knee: Secondary | ICD-10-CM

## 2015-01-03 DIAGNOSIS — M25661 Stiffness of right knee, not elsewhere classified: Secondary | ICD-10-CM | POA: Diagnosis not present

## 2015-01-03 DIAGNOSIS — R2681 Unsteadiness on feet: Secondary | ICD-10-CM

## 2015-01-03 DIAGNOSIS — R269 Unspecified abnormalities of gait and mobility: Secondary | ICD-10-CM

## 2015-01-03 DIAGNOSIS — R262 Difficulty in walking, not elsewhere classified: Secondary | ICD-10-CM

## 2015-01-03 NOTE — Therapy (Signed)
[place], [place], [address] Phone: [phone]   Fax:  [phone]  Physical Therapy Treatment  Patient Details  Name: Ariel Parker MRN: [id number] Date of Birth: [birth year] Referring Provider:  Ninetta Lights, MD  Encounter Date: 01/03/2015      PT End of Session - 01/03/15 1434    Visit Number 28   Number of Visits 36   Date for PT Re-Evaluation 01/23/15   Authorization Type BCBS/ [employer] secondary (PROGRESS NOTE MUST BE DONE WEEKLY FOR MILITARY, give to patient each Friday)   Authorization Time Period 12/26/14-02/25/15   PT Start Time 0847   PT Stop Time 0932   PT Time Calculation (min) 45 min   Activity Tolerance Patient tolerated treatment well   Behavior During Therapy [facility] for tasks assessed/performed      Past Medical History  Diagnosis Date  . Disruption of anterior cruciate ligament of right knee 09/2014    Past Surgical History  Procedure Laterality Date  . Wisdom tooth extraction    . Knee arthroscopy w/ acl reconstruction Left 07/17/2006  . Knee arthroscopy with anterior cruciate ligament (acl) repair with hamstring graft Right 10/14/2014    Procedure: RIGHT KNEE ARTHROSCOPY WITH ANTERIOR CRUCIATE LIGAMENT (ACL) REPAIR;  Surgeon: Kathryne Hitch, MD;  Location: [place];  Service: Orthopedics;  Laterality: Right;  . Knee arthroscopy with lateral menisectomy Right 10/14/2014    Procedure: KNEE ARTHROSCOPY WITH LATERAL MENISECTOMY;  Surgeon: Kathryne Hitch, MD;  Location: [place];  Service: Orthopedics;  Laterality: Right;    There were no vitals filed for this visit.  Visit Diagnosis:  Knee stiffness, right  Right knee pain  Unsteadiness  Abnormality of gait  Difficulty walking      Subjective Assessment - 01/03/15 0851    Subjective Pt has nothing new to report. 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    Pertinent History Patient was playing soccer, jumped up and landed and heard a pop. ACL surgery was the 21st of July. Things at home have been going OK but at one point MD thought there might have been a blood clot, which did come back negative. Was taken off of CPM due to blood clot concern and to her knowledge is not going to be put back on CPM.    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 No/denies   Pain Score 0-No pain                         [facility] Adult PT Treatment/Exercise - 01/03/15 0001    Knee/Hip Exercises: Stretches   Active Hamstring Stretch 3 reps;30 seconds;Right   Engineer, manufacturing Limitations Contract Relax stretching    Knee: Self-Stretch to increase Flexion Other (comment)   Knee: Self-Stretch Limitations 14 inch step knee drives 29U 10"   Gastroc Stretch Both;3 reps;30 seconds   Gastroc Stretch Limitations slantboard    Knee/Hip Exercises: Standing   Lateral Step Up Right;1 set;15 reps;Hand Hold: 0;Step Height: 8"   Lateral Step Up Limitations 8 inch box    Forward Step Up Right;15 reps;Hand Hold: 0;Step Height: 8"   Forward Step Up Limitations 8 inch box    Step Down 15 reps;Step Height: 6";Hand Hold: 1  Other Standing Knee Exercises Y balance excursion: Post-Lat, Post Med  L stance: 78cm; R stance 60cm: 2x6 bilat   Other Standing Knee Exercises R mini squat c LLE tape 9:00-1:00  focus on R hip ER in CKC curin 9:00/10:00 taps   Knee/Hip Exercises: Supine   Heel Slides Limitations 118*    Manual Therapy   Joint Mobilization TibFem Post Glide Gr III  4x45 seconds                PT Education - 01/03/15 1433    Education provided Yes   Education Details No updates this session. Reviewed ideal mechanics for safe knee stability with active hip rotation.    Person(s) Educated Patient   Methods  Explanation   Comprehension Verbalized understanding          PT Short Term Goals - 01/03/15 1441    PT SHORT TERM GOAL #1   Title Patient will demonstrate R knee ROM of 0-120 degrees with no pain   Baseline 10/10- continues to be limited by capsular tightness. ROM 2-118   Time 3   Period Weeks   Status On-going   PT SHORT TERM GOAL #2   Title Patient will demonstrate at least 4-/5 strength in R knee and at least 4+/5 strength in L LE    Time 3   Period Weeks   Status On-going   PT SHORT TERM GOAL #3   Title Patient to be ambulating unlimited distances without crutches and WBAT R LE, equal step lengths, equal weight bearing each side, minimal unsteadiness, pain no more than 1/10   Baseline Met with some partial swelling thereafter, but tolerated well.    Time 3   Period Weeks   Status On-going   PT SHORT TERM GOAL #4   Title Patient will experience 0/10 pain R knee  with all functional weight bearing tasks and exercises    Time 3   Period Weeks   Status Achieved   PT SHORT TERM GOAL #5   Title Patient to be independent in consistently and correctly performing  appropriate HEP, to be updated PRN    Baseline 08/29/216:  Reports compliance daily   Time 3   Period Weeks   Status Achieved           PT Long Term Goals - 12/24/14 1225    PT LONG TERM GOAL #1   Title Patient to have 0 degrees extension  and 130 degrees flexion R knee, pain 0/10   Time 6   Period Weeks   Status On-going   PT LONG TERM GOAL #2   Title Patient will be able to perform single leg squat to floor with R knee, minimal knee valgus, and pain 0/10   Time 6   Period Weeks   Status On-going   PT LONG TERM GOAL #3   Title Patient will be able to perform single leg hops of equal distance with bilateral lower extremities and pain 0/10 R knee    Time 6   Period Weeks   Status On-going   PT LONG TERM GOAL #4   Title Patient to be able to jump off of at least 14 inch box and land on bilateral feet  with good technique, good knee positioning with minimal knee valgus, and pain R knee 0/10   Time 6   Period Weeks   Status On-going   PT LONG TERM GOAL #5   Title Patient to be able to maintain single leg  stance on BOSU for at least 30 seconds with mild to moderate perturbations, pain R knee 0/10   Time 6   Period Weeks   Status On-going   PT LONG TERM GOAL #6   Title Patient will demonstrate the ability to safely ascend and descend full height ladder and crawl around obstacles with pain R knee 0/10   Time 6   Period Weeks   Status Achieved               Plan - 01/03/15 1435    Clinical Impression Statement Pt tolerating treatment well, motivated to participate thorughotu session, with good activtiy tolerance and appropriate pain response to all activity. Pt ROM continues to be slowly improving over last several sessions. Introduced posterior glides today to assist in posterior translation and improve flexixon ROM. Pt tolerates with mild stretch sensation, denies pain.  Progressed dynamic lower chain  stability activities today with star excursion and Y balance practice bilaterally. Pt demonstrating good understanding of ideal mechanics during activity and equal balance bialterally, although dynamic excursion is about 80% on operative side. Wll continue with POC as previously indicated to address these and aforementioned impairments, to restore patient to PLOF.    Pt will benefit from skilled therapeutic intervention in order to improve on the following deficits Abnormal gait;Decreased coordination;Decreased range of motion;Difficulty walking;Impaired tone;Decreased safety awareness;Decreased activity tolerance;Pain;Decreased balance;Impaired flexibility;Improper body mechanics;Decreased mobility;Decreased strength;Increased edema;Postural dysfunction   Rehab Potential Excellent   PT Frequency 3x / week   PT Duration 4 weeks   PT Treatment/Interventions ADLs/Self Care Home  Management;Cryotherapy;Electrical Stimulation;DME Instruction;Gait training;Stair training;Functional mobility training;Therapeutic activities;Therapeutic exercise;Balance training;Neuromuscular re-education;Patient/family education;Manual techniques;Passive range of motion   PT Next Visit Plan Pt 10 weeks post-op progress patient per protocol if appropriate (ROM currently 1-118 and quads strength at 48% not quite met the criteria for phase III.  Complete progress note prior MD apt in next session (apt scheduled for 01/04/2015) ; Continue with ROM activities, contract relax stretching to improve flexion, quad and hamstring strengthening   PT Home Exercise Plan No changes this session    Consulted and Agree with Plan of Care Patient        Problem List There are no active problems to display for this patient.   Keyshia Orwick C 01/03/2015, 2:45 PM  2:45 PM  Etta Grandchild, PT, DPT [place] License # [id number]       [facility] [address] [place], [place], [address] Phone: [phone]   Fax:  [phone]

## 2015-01-05 ENCOUNTER — Ambulatory Visit (HOSPITAL_COMMUNITY): Payer: Federal, State, Local not specified - PPO | Admitting: Physical Therapy

## 2015-01-05 DIAGNOSIS — R262 Difficulty in walking, not elsewhere classified: Secondary | ICD-10-CM

## 2015-01-05 DIAGNOSIS — R269 Unspecified abnormalities of gait and mobility: Secondary | ICD-10-CM

## 2015-01-05 DIAGNOSIS — Z9889 Other specified postprocedural states: Secondary | ICD-10-CM

## 2015-01-05 DIAGNOSIS — M25561 Pain in right knee: Secondary | ICD-10-CM

## 2015-01-05 DIAGNOSIS — M25661 Stiffness of right knee, not elsewhere classified: Secondary | ICD-10-CM

## 2015-01-05 DIAGNOSIS — R2681 Unsteadiness on feet: Secondary | ICD-10-CM

## 2015-01-05 NOTE — Therapy (Signed)
[place], [place], [address] Phone: [phone]   Fax:  [phone]  Physical Therapy Treatment  Patient Details  Name: Ariel Parker MRN: [id number] Date of Birth: 1990/08/16 Referring Provider:  Ninetta Lights, MD  Encounter Date: 01/05/2015      PT End of Session - 01/05/15 1056    Visit Number 29   Number of Visits 36   Date for PT Re-Evaluation 01/23/15   Authorization Type BCBS/ [employer] secondary (PROGRESS NOTE MUST BE DONE WEEKLY FOR MILITARY, give to patient each Friday)   Authorization Time Period 12/26/14-02/25/15   PT Start Time 0850   PT Stop Time 0948   PT Time Calculation (min) 58 min   Activity Tolerance Patient tolerated treatment well   Behavior During Therapy [facility] for tasks assessed/performed      Past Medical History  Diagnosis Date  . Disruption of anterior cruciate ligament of right knee 09/2014    Past Surgical History  Procedure Laterality Date  . Wisdom tooth extraction    . Knee arthroscopy w/ acl reconstruction Left 07/17/2006  . Knee arthroscopy with anterior cruciate ligament (acl) repair with hamstring graft Right 10/14/2014    Procedure: RIGHT KNEE ARTHROSCOPY WITH ANTERIOR CRUCIATE LIGAMENT (ACL) REPAIR;  Surgeon: Kathryne Hitch, MD;  Location: [place];  Service: Orthopedics;  Laterality: Right;  . Knee arthroscopy with lateral menisectomy Right 10/14/2014    Procedure: KNEE ARTHROSCOPY WITH LATERAL MENISECTOMY;  Surgeon: Kathryne Hitch, MD;  Location: [place];  Service: Orthopedics;  Laterality: Right;    There were no vitals filed for this visit.  Visit Diagnosis:  Knee stiffness, right  Right knee pain  Unsteadiness  Abnormality of gait  Difficulty walking  S/P ACL repair      Subjective Assessment - 01/05/15 1051    Subjective Pt states she is doing well today without any pain or difficulties.   Currently in Pain? No/denies             [facility] PT Assessment - 01/05/15 0001    Assessment   Medical Diagnosis s/p R ACL repair    Onset Date/Surgical Date 10/14/14   Next MD Visit [facility] 01/04/2015                     [place] Adult PT Treatment/Exercise - 01/05/15 0851    Knee/Hip Exercises: Stretches   Active Hamstring Stretch 3 reps;30 seconds;Right   Active Hamstring Stretch Limitations 12" step   Knee: Self-Stretch to increase Flexion Other (comment)   Knee: Self-Stretch Limitations 14 inch step knee drives 32R 10"   Gastroc Stretch Both;3 reps;30 seconds   Gastroc Stretch Limitations slantboard    Knee/Hip Exercises: Aerobic   Tread Mill 8 minutes 2.8 mph   Knee/Hip Exercises: Machines for Strengthening   Total Gym Leg Press 15 reps 28 degrees Rt LE only   Other Machine Biodex isokinetic 150 <>120 <>90   Knee/Hip Exercises: Standing   Lateral Step Up Right;1 set;15 reps;Hand Hold: 0;Step Height: 8"   Lateral Step Up Limitations 8 inch box    Forward Step Up Right;15 reps;Hand Hold: 0;Step Height: 8"   Forward Step Up Limitations 8 inch box    Step Down 15 reps;Step Height: 6";Hand Hold: 1   Step Down Limitations 8 inch box    Functional Squat 10 reps;5 seconds   Functional Squat Limitations on BOSU   Wall Squat 5 sets   Wall Squat Limitations  30 seconds   Lunge Walking - Round Trips 1RT on balance beam    Gait Training single leg balance reaches 3 directions 1x10   Other Standing Knee Exercises R mini squats                   PT Short Term Goals - 01/03/15 1441    PT SHORT TERM GOAL #1   Title Patient will demonstrate R knee ROM of 0-120 degrees with no pain   Baseline 10/10- continues to be limited by capsular tightness. ROM 2-118   Time 3   Period Weeks   Status On-going   PT SHORT TERM GOAL #2   Title Patient will demonstrate at least 4-/5 strength in R knee and at least 4+/5 strength in L LE    Time 3   Period Weeks   Status On-going   PT SHORT TERM  GOAL #3   Title Patient to be ambulating unlimited distances without crutches and WBAT R LE, equal step lengths, equal weight bearing each side, minimal unsteadiness, pain no more than 1/10   Baseline Met with some partial swelling thereafter, but tolerated well.    Time 3   Period Weeks   Status On-going   PT SHORT TERM GOAL #4   Title Patient will experience 0/10 pain R knee  with all functional weight bearing tasks and exercises    Time 3   Period Weeks   Status Achieved   PT SHORT TERM GOAL #5   Title Patient to be independent in consistently and correctly performing  appropriate HEP, to be updated PRN    Baseline 08/29/216:  Reports compliance daily   Time 3   Period Weeks   Status Achieved           PT Long Term Goals - 12/24/14 1225    PT LONG TERM GOAL #1   Title Patient to have 0 degrees extension  and 130 degrees flexion R knee, pain 0/10   Time 6   Period Weeks   Status On-going   PT LONG TERM GOAL #2   Title Patient will be able to perform single leg squat to floor with R knee, minimal knee valgus, and pain 0/10   Time 6   Period Weeks   Status On-going   PT LONG TERM GOAL #3   Title Patient will be able to perform single leg hops of equal distance with bilateral lower extremities and pain 0/10 R knee    Time 6   Period Weeks   Status On-going   PT LONG TERM GOAL #4   Title Patient to be able to jump off of at least 14 inch box and land on bilateral feet with good technique, good knee positioning with minimal knee valgus, and pain R knee 0/10   Time 6   Period Weeks   Status On-going   PT LONG TERM GOAL #5   Title Patient to be able to maintain single leg stance on BOSU for at least 30 seconds with mild to moderate perturbations, pain R knee 0/10   Time 6   Period Weeks   Status On-going   PT LONG TERM GOAL #6   Title Patient will demonstrate the ability to safely ascend and descend full height ladder and crawl around obstacles with pain R knee 0/10    Time 6   Period Weeks   Status Achieved               Plan -  01/05/15 1056    Clinical Impression Statement Pt unable to complete her MD yesterday due to insurance issues and has r/s for 2 more weeks.  Progressed difficulty and stability of therex today.  Increased intensity and resumed biodex isokinetic workout, added single leg squat on power tower, lunges on balance beam and squats/lunges on BOSU.  Increased static hold to 30" for wall sits as well .  Pt without pain during or after session today.     PT Next Visit Plan Pt 12 weeks post-op progress patient per protocol if appropriate (ROM currently 1-118 and quads strength at 48% not quite met the criteria for phase III).  Continue agressive quad and hamstring strengthening per protocol.     PT Home Exercise Plan No changes this session    Recommended Other Services ACL protocol.  Pt is 12 weeks on 10/13   Consulted and Agree with Plan of Care Patient        Problem List There are no active problems to display for this patient.   Teena Irani, PTA/CLT [phone] 01/05/2015, 11:03 AM  [place], [place], [address] Phone: [phone]   Fax:  [phone]

## 2015-01-07 ENCOUNTER — Ambulatory Visit (HOSPITAL_COMMUNITY): Payer: Federal, State, Local not specified - PPO | Admitting: Physical Therapy

## 2015-01-07 DIAGNOSIS — Z9889 Other specified postprocedural states: Secondary | ICD-10-CM

## 2015-01-07 DIAGNOSIS — M25561 Pain in right knee: Secondary | ICD-10-CM

## 2015-01-07 DIAGNOSIS — M25661 Stiffness of right knee, not elsewhere classified: Secondary | ICD-10-CM | POA: Diagnosis not present

## 2015-01-07 DIAGNOSIS — R269 Unspecified abnormalities of gait and mobility: Secondary | ICD-10-CM

## 2015-01-07 DIAGNOSIS — R2681 Unsteadiness on feet: Secondary | ICD-10-CM

## 2015-01-07 DIAGNOSIS — R262 Difficulty in walking, not elsewhere classified: Secondary | ICD-10-CM

## 2015-01-07 NOTE — Therapy (Signed)
[facility] [address] [place], , [address] Phone: [phone]   Fax:  [phone]  Physical Therapy Treatment  Patient Details  Name: Ariel Parker MRN: [id number] Date of Birth: 04/01/1990 No Data Recorded  Encounter Date: 01/07/2015      PT End of Session - 01/07/15 1025    Visit Number 30   Number of Visits 36   Date for PT Re-Evaluation 01/23/15   Authorization Type BCBS/ [employer] secondary (PROGRESS NOTE MUST BE DONE WEEKLY FOR MILITARY, give to patient each Friday)   Authorization Time Period 12/26/14-02/25/15   PT Start Time 0850   PT Stop Time 0928   PT Time Calculation (min) 38 min   Activity Tolerance Patient tolerated treatment well   Behavior During Therapy [facility] for tasks assessed/performed      Past Medical History  Diagnosis Date  . Disruption of anterior cruciate ligament of right knee 09/2014    Past Surgical History  Procedure Laterality Date  . Wisdom tooth extraction    . Knee arthroscopy w/ acl reconstruction Left 07/17/2006  . Knee arthroscopy with anterior cruciate ligament (acl) repair with hamstring graft Right 10/14/2014    Procedure: RIGHT KNEE ARTHROSCOPY WITH ANTERIOR CRUCIATE LIGAMENT (ACL) REPAIR;  Surgeon: Daniel Murphy, MD;  Location: [facility];  Service: Orthopedics;  Laterality: Right;  . Knee arthroscopy with lateral menisectomy Right 10/14/2014    Procedure: KNEE ARTHROSCOPY WITH LATERAL MENISECTOMY;  Surgeon: Daniel Murphy, MD;  Location: [facility];  Service: Orthopedics;  Laterality: Right;    There were no vitals filed for this visit.  Visit Diagnosis:  Knee stiffness, right  Right knee pain  Unsteadiness  Abnormality of gait  Difficulty walking  S/P ACL repair      Subjective Assessment - 01/07/15 0849    Subjective Patient reports she is doing well today, no pain or difficulty with her knee this morning    Pertinent History Patient was  playing soccer, jumped up and landed and heard a pop. ACL surgery was the 21st of July. Things at home have been going OK but at one point MD thought there might have been a blood clot, which did come back negative. Was taken off of CPM due to blood clot concern and to her knowledge is not going to be put back on CPM.    Currently in Pain? No/denies                         [facility] Adult PT Treatment/Exercise - 01/07/15 0001    Knee/Hip Exercises: Stretches   Active Hamstring Stretch Right;3 reps;30 seconds   Quad Stretch Right;4 reps;30 seconds   Quad Stretch Limitations prone with rope    Knee: Self-Stretch to increase Flexion Other (comment)   Knee: Self-Stretch Limitations 12 inch box    Gastroc Stretch Both;3 reps;30 seconds   Gastroc Stretch Limitations slantboard    Knee/Hip Exercises: Aerobic   Tread Mill 8 minutes on treadmill 3.2mph    Knee/Hip Exercises: Standing   Lateral Step Up Right;1 set;20 reps   Lateral Step Up Limitations 8 inch box    Forward Step Up Right;1 set;20 reps   Forward Step Up Limitations 8 inch box    Step Down Right;15 reps   Step Down Limitations 7 inch step    Functional Squat 15 reps   Functional Squat Limitations on BOSU    Lunge Walking - Round Trips 2RT x70ft      Rocker Board Limitations x20AP and lateral right leg only    Gait Training single leg balance reaches 3 directions 1x10 right leg with 5 second hold each direction    Other Standing Knee Exercises Hip ABD walks 2x70ft;  sled pushes with PT on sled 4x30ft    Other Standing Knee Exercises 3D hip excursions toe touch 1x15                 PT Education - 01/07/15 1025    Education provided No          PT Short Term Goals - 01/03/15 1441    PT SHORT TERM GOAL #1   Title Patient will demonstrate R knee ROM of 0-120 degrees with no pain   Baseline 10/10- continues to be limited by capsular tightness. ROM 2-118   Time 3   Period Weeks   Status On-going   PT  SHORT TERM GOAL #2   Title Patient will demonstrate at least 4-/5 strength in R knee and at least 4+/5 strength in L LE    Time 3   Period Weeks   Status On-going   PT SHORT TERM GOAL #3   Title Patient to be ambulating unlimited distances without crutches and WBAT R LE, equal step lengths, equal weight bearing each side, minimal unsteadiness, pain no more than 1/10   Baseline Met with some partial swelling thereafter, but tolerated well.    Time 3   Period Weeks   Status On-going   PT SHORT TERM GOAL #4   Title Patient will experience 0/10 pain R knee  with all functional weight bearing tasks and exercises    Time 3   Period Weeks   Status Achieved   PT SHORT TERM GOAL #5   Title Patient to be independent in consistently and correctly performing  appropriate HEP, to be updated PRN    Baseline 08/29/216:  Reports compliance daily   Time 3   Period Weeks   Status Achieved           PT Long Term Goals - 12/24/14 1225    PT LONG TERM GOAL #1   Title Patient to have 0 degrees extension  and 130 degrees flexion R knee, pain 0/10   Time 6   Period Weeks   Status On-going   PT LONG TERM GOAL #2   Title Patient will be able to perform single leg squat to floor with R knee, minimal knee valgus, and pain 0/10   Time 6   Period Weeks   Status On-going   PT LONG TERM GOAL #3   Title Patient will be able to perform single leg hops of equal distance with bilateral lower extremities and pain 0/10 R knee    Time 6   Period Weeks   Status On-going   PT LONG TERM GOAL #4   Title Patient to be able to jump off of at least 14 inch box and land on bilateral feet with good technique, good knee positioning with minimal knee valgus, and pain R knee 0/10   Time 6   Period Weeks   Status On-going   PT LONG TERM GOAL #5   Title Patient to be able to maintain single leg stance on BOSU for at least 30 seconds with mild to moderate perturbations, pain R knee 0/10   Time 6   Period Weeks    Status On-going   PT LONG TERM GOAL #6   Title Patient will demonstrate the   ability to safely ascend and descend full height ladder and crawl around obstacles with pain R knee 0/10   Time 6   Period Weeks   Status Achieved               Plan - 01/07/15 1026    Clinical Impression Statement Continued fucntional exercises and stretches today with focus on R leg strengthing, also increased speed on treadmilll today. No increase in pain throughout session.    Pt will benefit from skilled therapeutic intervention in order to improve on the following deficits Abnormal gait;Decreased coordination;Decreased range of motion;Difficulty walking;Impaired tone;Decreased safety awareness;Decreased activity tolerance;Pain;Decreased balance;Impaired flexibility;Improper body mechanics;Decreased mobility;Decreased strength;Increased edema;Postural dysfunction   Rehab Potential Excellent   PT Frequency 3x / week   PT Duration 4 weeks   PT Treatment/Interventions ADLs/Self Care Home Management;Cryotherapy;Electrical Stimulation;DME Instruction;Gait training;Stair training;Functional mobility training;Therapeutic activities;Therapeutic exercise;Balance training;Neuromuscular re-education;Patient/family education;Manual techniques;Passive range of motion   PT Next Visit Plan Pt 12 weeks post-op progress patient per protocol if appropriate (ROM currently 1-118 and quads strength at 48% not quite met the criteria for phase III).  Continue agressive quad and hamstring strengthening per protocol.     PT Home Exercise Plan No changes this session    Consulted and Agree with Plan of Care Patient        Problem List There are no active problems to display for this patient.   Deniece Ree PT, DPT [id number]  [place] [address] [place], [place], [address] Phone: [phone]   Fax:  [phone]  Name: Anmarie Fukushima MRN: [id number] Date of Birth:  03/13/1991

## 2015-01-10 ENCOUNTER — Encounter (HOSPITAL_COMMUNITY): Admitting: Physical Therapy

## 2015-01-11 ENCOUNTER — Ambulatory Visit (HOSPITAL_COMMUNITY): Payer: Federal, State, Local not specified - PPO | Admitting: Physical Therapy

## 2015-01-11 DIAGNOSIS — M25661 Stiffness of right knee, not elsewhere classified: Secondary | ICD-10-CM | POA: Diagnosis not present

## 2015-01-11 DIAGNOSIS — R262 Difficulty in walking, not elsewhere classified: Secondary | ICD-10-CM

## 2015-01-11 DIAGNOSIS — R269 Unspecified abnormalities of gait and mobility: Secondary | ICD-10-CM

## 2015-01-11 DIAGNOSIS — Z9889 Other specified postprocedural states: Secondary | ICD-10-CM

## 2015-01-11 DIAGNOSIS — M25561 Pain in right knee: Secondary | ICD-10-CM

## 2015-01-11 DIAGNOSIS — R2681 Unsteadiness on feet: Secondary | ICD-10-CM

## 2015-01-11 NOTE — Therapy (Signed)
[place], [place], [address] Phone: [phone]   Fax:  [phone]  Physical Therapy Treatment  Patient Details  Name: Ariel Parker MRN: [id number] Date of Birth: Apr 22, 1990 No Data Recorded  Encounter Date: 01/11/2015      PT End of Session - 01/11/15 1103    Visit Number 31   Number of Visits 36   Date for PT Re-Evaluation 01/23/15   Authorization Type BCBS/ [employer] secondary (PROGRESS NOTE MUST BE DONE WEEKLY FOR MILITARY, give to patient each Friday)   Authorization Time Period 12/26/14-02/25/15   PT Start Time 1021   PT Stop Time 1100   PT Time Calculation (min) 39 min   Activity Tolerance Patient tolerated treatment well   Behavior During Therapy [facility] for tasks assessed/performed      Past Medical History  Diagnosis Date  . Disruption of anterior cruciate ligament of right knee 09/2014    Past Surgical History  Procedure Laterality Date  . Wisdom tooth extraction    . Knee arthroscopy w/ acl reconstruction Left 07/17/2006  . Knee arthroscopy with anterior cruciate ligament (acl) repair with hamstring graft Right 10/14/2014    Procedure: RIGHT KNEE ARTHROSCOPY WITH ANTERIOR CRUCIATE LIGAMENT (ACL) REPAIR;  Surgeon: Kathryne Hitch, MD;  Location: [place];  Service: Orthopedics;  Laterality: Right;  . Knee arthroscopy with lateral menisectomy Right 10/14/2014    Procedure: KNEE ARTHROSCOPY WITH LATERAL MENISECTOMY;  Surgeon: Kathryne Hitch, MD;  Location: [facility];  Service: Orthopedics;  Laterality: Right;    There were no vitals filed for this visit.  Visit Diagnosis:  Knee stiffness, right  Right knee pain  Unsteadiness  Abnormality of gait  Difficulty walking  S/P ACL repair      Subjective Assessment - 01/11/15 1021    Subjective Patient reports that she is doing well today, had an uneventful weekend    Pertinent History Patient was playing soccer,  jumped up and landed and heard a pop. ACL surgery was the 21st of July. Things at home have been going OK but at one point MD thought there might have been a blood clot, which did come back negative. Was taken off of CPM due to blood clot concern and to her knowledge is not going to be put back on CPM.    Currently in Pain? No/denies                         [facility] Adult PT Treatment/Exercise - 01/11/15 0001    Knee/Hip Exercises: Stretches   Active Hamstring Stretch Right;3 reps;30 seconds   Active Hamstring Stretch Limitations 12 inch box    Quad Stretch Right;4 reps;30 seconds   Quad Stretch Limitations prone with rope    Knee: Self-Stretch to increase Flexion Other (comment)   Knee: Self-Stretch Limitations 12 inch box, 10 second holds    Press photographer Both;3 reps;30 seconds   Gastroc Stretch Limitations slantboard    Knee/Hip Exercises: Aerobic   Tread Mill 8 minutes on treadmill 3.8mh    Other Aerobic light jogging approx [id number] good tolearnce by patient  on even surfaces    Knee/Hip Exercises: Standing   Lateral Step Up Right;1 set;20 reps   Lateral Step Up Limitations 8 inch box with knee    Forward Step Up Right;1 set;20 reps   Forward Step Up Limitations 8 inch box with knees    Functional Squat 15 reps  Functional Squat Limitations on BOSU    Wall Squat 10 reps   Wall Squat Limitations 30 seconds   Lunge Walking - Round Trips 2RT x70ft    Gait Training single leg balance reaches 3 directions 1x10 right leg with 5 second hold each direction    Other Standing Knee Exercises Hip ABD walks 2x70ft   Other Standing Knee Exercises 3D hip excursions toe touch 1x15    Knee/Hip Exercises: Seated   Stool Scoot - Round Trips 30ft R LE only                 PT Education - 01/11/15 1102    Education provided Yes   Education Details educated not to try jogging on her own just yet due to ongoing knee instability and quad weakness, still  requires skilled spotting to maintain safety    Person(s) Educated Patient   Methods Explanation   Comprehension Verbalized understanding          PT Short Term Goals - 01/03/15 1441    PT SHORT TERM GOAL #1   Title Patient will demonstrate R knee ROM of 0-120 degrees with no pain   Baseline 10/10- continues to be limited by capsular tightness. ROM 2-118   Time 3   Period Weeks   Status On-going   PT SHORT TERM GOAL #2   Title Patient will demonstrate at least 4-/5 strength in R knee and at least 4+/5 strength in L LE    Time 3   Period Weeks   Status On-going   PT SHORT TERM GOAL #3   Title Patient to be ambulating unlimited distances without crutches and WBAT R LE, equal step lengths, equal weight bearing each side, minimal unsteadiness, pain no more than 1/10   Baseline Met with some partial swelling thereafter, but tolerated well.    Time 3   Period Weeks   Status On-going   PT SHORT TERM GOAL #4   Title Patient will experience 0/10 pain R knee  with all functional weight bearing tasks and exercises    Time 3   Period Weeks   Status Achieved   PT SHORT TERM GOAL #5   Title Patient to be independent in consistently and correctly performing  appropriate HEP, to be updated PRN    Baseline 08/29/216:  Reports compliance daily   Time 3   Period Weeks   Status Achieved           PT Long Term Goals - 12/24/14 1225    PT LONG TERM GOAL #1   Title Patient to have 0 degrees extension  and 130 degrees flexion R knee, pain 0/10   Time 6   Period Weeks   Status On-going   PT LONG TERM GOAL #2   Title Patient will be able to perform single leg squat to floor with R knee, minimal knee valgus, and pain 0/10   Time 6   Period Weeks   Status On-going   PT LONG TERM GOAL #3   Title Patient will be able to perform single leg hops of equal distance with bilateral lower extremities and pain 0/10 R knee    Time 6   Period Weeks   Status On-going   PT LONG TERM GOAL #4    Title Patient to be able to jump off of at least 14 inch box and land on bilateral feet with good technique, good knee positioning with minimal knee valgus, and pain R knee 0/10     Time 6   Period Weeks   Status On-going   PT LONG TERM GOAL #5   Title Patient to be able to maintain single leg stance on BOSU for at least 30 seconds with mild to moderate perturbations, pain R knee 0/10   Time 6   Period Weeks   Status On-going   PT LONG TERM GOAL #6   Title Patient will demonstrate the ability to safely ascend and descend full height ladder and crawl around obstacles with pain R knee 0/10   Time 6   Period Weeks   Status Achieved               Plan - 01/11/15 1103    Clinical Impression Statement Conintued functional exercises and stretches today with good tolerance by patient. Introduced box knees today as well as some light jogging on level surfaces inside and outside with fair tolerance by patient; jogging currently limited due to ongoing knee instablity and quad weakness, but not painful.    Pt will benefit from skilled therapeutic intervention in order to improve on the following deficits Abnormal gait;Decreased coordination;Decreased range of motion;Difficulty walking;Impaired tone;Decreased safety awareness;Decreased activity tolerance;Pain;Decreased balance;Impaired flexibility;Improper body mechanics;Decreased mobility;Decreased strength;Increased edema;Postural dysfunction   Rehab Potential Excellent   PT Frequency 3x / week   PT Duration 4 weeks   PT Treatment/Interventions ADLs/Self Care Home Management;Cryotherapy;Electrical Stimulation;DME Instruction;Gait training;Stair training;Functional mobility training;Therapeutic activities;Therapeutic exercise;Balance training;Neuromuscular re-education;Patient/family education;Manual techniques;Passive range of motion   PT Next Visit Plan Pt 12 weeks post-op progress patient per protocol if appropriate (ROM currently 1-118 and quads  strength at 48% not quite met the criteria for phase III).  Continue agressive quad and hamstring strengthening per protocol.     PT Home Exercise Plan No changes this session    Recommended Other Services ACL protocol. Pt is 12 weeks on 10/13   Consulted and Agree with Plan of Care Patient        Problem List There are no active problems to display for this patient.   Deniece Ree PT, DPT [phone]  [place] [address] [place], [place], [address] Phone: [phone]   Fax:  [phone]  Name: Ariel Parker MRN: [id number] Date of Birth: 1990-12-22

## 2015-01-12 ENCOUNTER — Ambulatory Visit (HOSPITAL_COMMUNITY): Payer: Federal, State, Local not specified - PPO | Admitting: Physical Therapy

## 2015-01-12 DIAGNOSIS — R262 Difficulty in walking, not elsewhere classified: Secondary | ICD-10-CM

## 2015-01-12 DIAGNOSIS — R269 Unspecified abnormalities of gait and mobility: Secondary | ICD-10-CM

## 2015-01-12 DIAGNOSIS — Z9889 Other specified postprocedural states: Secondary | ICD-10-CM

## 2015-01-12 DIAGNOSIS — M25661 Stiffness of right knee, not elsewhere classified: Secondary | ICD-10-CM | POA: Diagnosis not present

## 2015-01-12 DIAGNOSIS — R2681 Unsteadiness on feet: Secondary | ICD-10-CM

## 2015-01-12 DIAGNOSIS — M25561 Pain in right knee: Secondary | ICD-10-CM

## 2015-01-12 NOTE — Therapy (Signed)
[place], [place], [address] Phone: [phone]   Fax:  [phone]  Physical Therapy Treatment  Patient Details  Name: Ariel Parker MRN: [id number] Date of Birth: 04/30/1990 No Data Recorded  Encounter Date: 01/12/2015      PT End of Session - 01/12/15 1159    Visit Number 32   Number of Visits 36   Date for PT Re-Evaluation 01/23/15   Authorization Type BCBS/ [employer] secondary (PROGRESS NOTE MUST BE DONE WEEKLY FOR MILITARY, give to patient each Friday)   Authorization Time Period 12/26/14-02/25/15   PT Start Time 1022   PT Stop Time 1100   PT Time Calculation (min) 38 min   Activity Tolerance Patient tolerated treatment well   Behavior During Therapy [facility] for tasks assessed/performed      Past Medical History  Diagnosis Date  . Disruption of anterior cruciate ligament of right knee 09/2014    Past Surgical History  Procedure Laterality Date  . Wisdom tooth extraction    . Knee arthroscopy w/ acl reconstruction Left 07/17/2006  . Knee arthroscopy with anterior cruciate ligament (acl) repair with hamstring graft Right 10/14/2014    Procedure: RIGHT KNEE ARTHROSCOPY WITH ANTERIOR CRUCIATE LIGAMENT (ACL) REPAIR;  Surgeon: Kathryne Hitch, MD;  Location: [place];  Service: Orthopedics;  Laterality: Right;  . Knee arthroscopy with lateral menisectomy Right 10/14/2014    Procedure: KNEE ARTHROSCOPY WITH LATERAL MENISECTOMY;  Surgeon: Kathryne Hitch, MD;  Location: [place];  Service: Orthopedics;  Laterality: Right;    There were no vitals filed for this visit.  Visit Diagnosis:  Knee stiffness, right  Right knee pain  Unsteadiness  Abnormality of gait  Difficulty walking  S/P ACL repair      Subjective Assessment - 01/12/15 1025    Subjective Patient reports that she was unable to see MD yesterday due to ongoing insurance issues, however she has spoken to her base  and they think they may know what the issue is so hopeuflly she will be able to go to MD soon    Pertinent History Patient was playing soccer, jumped up and landed and heard a pop. ACL surgery was the 21st of July. Things at home have been going OK but at one point MD thought there might have been a blood clot, which did come back negative. Was taken off of CPM due to blood clot concern and to her knowledge is not going to be put back on CPM.    Currently in Pain? No/denies  just muscle soreness                          [facility] Adult PT Treatment/Exercise - 01/12/15 0001    Knee/Hip Exercises: Stretches   Active Hamstring Stretch 3 reps;30 seconds;Both   Active Hamstring Stretch Limitations 12 inch box    Quad Stretch Right;4 reps;30 seconds   Quad Stretch Limitations prone with rope    Knee: Self-Stretch to increase Flexion Other (comment)   Knee: Self-Stretch Limitations 12 inch box, 10 second holds    Gastroc Stretch Both;3 reps;30 seconds   Gastroc Stretch Limitations slantboard    Knee/Hip Exercises: Aerobic   Tread Mill 8 minutes on treadmill 3.27mh    Other Aerobic 4541fspeed walking, 45044fkight jogging on even surface  with PT supervision    Knee/Hip Exercises: Standing   Lateral Step Up Right;1 set;20 reps   Lateral Step  Up Limitations 8 inch box with knee    Forward Step Up Right;1 set;20 reps   Forward Step Up Limitations 8 inch box with knees    Step Down Right;15 reps   Step Down Limitations 8 inch box    Functional Squat 15 reps   Functional Squat Limitations on BOSU    Gait Training single leg balance reaches 3 directions 1x10 right leg with 5 second hold each direction    Other Standing Knee Exercises Hip ABD walks 264f    Other Standing Knee Exercises 3D hip excursions toe touch 1x15                 PT Education - 01/12/15 1159    Education provided No          PT Short Term Goals - 01/03/15 1441    PT SHORT TERM GOAL #1   Title  Patient will demonstrate R knee ROM of 0-120 degrees with no pain   Baseline 10/10- continues to be limited by capsular tightness. ROM 2-118   Time 3   Period Weeks   Status On-going   PT SHORT TERM GOAL #2   Title Patient will demonstrate at least 4-/5 strength in R knee and at least 4+/5 strength in L LE    Time 3   Period Weeks   Status On-going   PT SHORT TERM GOAL #3   Title Patient to be ambulating unlimited distances without crutches and WBAT R LE, equal step lengths, equal weight bearing each side, minimal unsteadiness, pain no more than 1/10   Baseline Met with some partial swelling thereafter, but tolerated well.    Time 3   Period Weeks   Status On-going   PT SHORT TERM GOAL #4   Title Patient will experience 0/10 pain R knee  with all functional weight bearing tasks and exercises    Time 3   Period Weeks   Status Achieved   PT SHORT TERM GOAL #5   Title Patient to be independent in consistently and correctly performing  appropriate HEP, to be updated PRN    Baseline 08/29/216:  Reports compliance daily   Time 3   Period Weeks   Status Achieved           PT Long Term Goals - 12/24/14 1225    PT LONG TERM GOAL #1   Title Patient to have 0 degrees extension  and 130 degrees flexion R knee, pain 0/10   Time 6   Period Weeks   Status On-going   PT LONG TERM GOAL #2   Title Patient will be able to perform single leg squat to floor with R knee, minimal knee valgus, and pain 0/10   Time 6   Period Weeks   Status On-going   PT LONG TERM GOAL #3   Title Patient will be able to perform single leg hops of equal distance with bilateral lower extremities and pain 0/10 R knee    Time 6   Period Weeks   Status On-going   PT LONG TERM GOAL #4   Title Patient to be able to jump off of at least 14 inch box and land on bilateral feet with good technique, good knee positioning with minimal knee valgus, and pain R knee 0/10   Time 6   Period Weeks   Status On-going   PT  LONG TERM GOAL #5   Title Patient to be able to maintain single leg stance on BOSU for  at least 30 seconds with mild to moderate perturbations, pain R knee 0/10   Time 6   Period Weeks   Status On-going   PT LONG TERM GOAL #6   Title Patient will demonstrate the ability to safely ascend and descend full height ladder and crawl around obstacles with pain R knee 0/10   Time 6   Period Weeks   Status Achieved               Plan - 01/12/15 1159    Clinical Impression Statement Continued with functional exercises and stretches today, also continued with light jogging with extended distance; patient experiencing quite a bit of muscle soreness today so treatment adjusted appropriately. No increased pain today througout session. Patient reports that  she was not able to see MD yesterday due to insurance issues but is going to try again.    Pt will benefit from skilled therapeutic intervention in order to improve on the following deficits Abnormal gait;Decreased coordination;Decreased range of motion;Difficulty walking;Impaired tone;Decreased safety awareness;Decreased activity tolerance;Pain;Decreased balance;Impaired flexibility;Improper body mechanics;Decreased mobility;Decreased strength;Increased edema;Postural dysfunction   Rehab Potential Excellent   PT Frequency 3x / week   PT Duration 4 weeks   PT Treatment/Interventions ADLs/Self Care Home Management;Cryotherapy;Electrical Stimulation;DME Instruction;Gait training;Stair training;Functional mobility training;Therapeutic activities;Therapeutic exercise;Balance training;Neuromuscular re-education;Patient/family education;Manual techniques;Passive range of motion   PT Next Visit Plan Pt 12 weeks post-op progress patient per protocol if appropriate (ROM currently 1-118 and quads strength at 48% not quite met the criteria for phase III).  Continue agressive quad and hamstring strengthening per protocol.     PT Home Exercise Plan No changes  this session    Recommended Other Services ACL protocol. Pt is 12 weeks on 10/13   Consulted and Agree with Plan of Care Patient        Problem List There are no active problems to display for this patient.   Deniece Ree PT, DPT [phone]  [place] [address] [place], [place], [address] Phone: [phone]   Fax:  [phone]  Name: Ariel Parker MRN: [id number] Date of Birth: 07-08-1990

## 2015-01-14 ENCOUNTER — Ambulatory Visit (HOSPITAL_COMMUNITY): Payer: Federal, State, Local not specified - PPO

## 2015-01-14 DIAGNOSIS — R269 Unspecified abnormalities of gait and mobility: Secondary | ICD-10-CM

## 2015-01-14 DIAGNOSIS — R262 Difficulty in walking, not elsewhere classified: Secondary | ICD-10-CM

## 2015-01-14 DIAGNOSIS — Z9889 Other specified postprocedural states: Secondary | ICD-10-CM

## 2015-01-14 DIAGNOSIS — R2681 Unsteadiness on feet: Secondary | ICD-10-CM

## 2015-01-14 DIAGNOSIS — M25561 Pain in right knee: Secondary | ICD-10-CM

## 2015-01-14 DIAGNOSIS — M25661 Stiffness of right knee, not elsewhere classified: Secondary | ICD-10-CM

## 2015-01-14 NOTE — Therapy (Signed)
[place] [address] [place], [place], [address] Phone: [phone]   Fax:  [phone]  Physical Therapy Treatment  Patient Details  Name: Ariel Parker MRN: [id number] Date of Birth: [birth year] No Data Recorded  Encounter Date: 01/14/2015      PT End of Session - 01/14/15 1048    Visit Number 33   Number of Visits 36   Date for PT Re-Evaluation 01/23/15   Authorization Type BCBS/ [employer] secondary (PROGRESS NOTE MUST BE DONE WEEKLY FOR MILITARY, give to patient each Friday)   Authorization Time Period 12/26/14-02/25/15   PT Start Time 1022   PT Stop Time 1108   PT Time Calculation (min) 46 min   Activity Tolerance Patient tolerated treatment well   Behavior During Therapy [facility] for tasks assessed/performed      Past Medical History  Diagnosis Date  . Disruption of anterior cruciate ligament of right knee 09/2014    Past Surgical History  Procedure Laterality Date  . Wisdom tooth extraction    . Knee arthroscopy w/ acl reconstruction Left 07/17/2006  . Knee arthroscopy with anterior cruciate ligament (acl) repair with hamstring graft Right 10/14/2014    Procedure: RIGHT KNEE ARTHROSCOPY WITH ANTERIOR CRUCIATE LIGAMENT (ACL) REPAIR;  Surgeon: Kathryne Hitch, MD;  Location: [place];  Service: Orthopedics;  Laterality: Right;  . Knee arthroscopy with lateral menisectomy Right 10/14/2014    Procedure: KNEE ARTHROSCOPY WITH LATERAL MENISECTOMY;  Surgeon: Kathryne Hitch, MD;  Location: [place];  Service: Orthopedics;  Laterality: Right;    There were no vitals filed for this visit.  Visit Diagnosis:  Knee stiffness, right  Right knee pain  Unsteadiness  Abnormality of gait  Difficulty walking  S/P ACL repair      Subjective Assessment - 01/14/15 1015    Subjective Pain free today, knee feels weak today   Pertinent History Patient was playing soccer, jumped up and landed and heard a pop.  ACL surgery was the 21st of July. Things at home have been going OK but at one point MD thought there might have been a blood clot, which did come back negative. Was taken off of CPM due to blood clot concern and to her knowledge is not going to be put back on CPM.    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 No/denies            [facility] Adult PT Treatment/Exercise - 01/14/15 0001    Knee/Hip Exercises: Stretches   Active Hamstring Stretch 3 reps;30 seconds;Both   Active Hamstring Stretch Limitations 12 inch box    Quad Stretch Right;4 reps;30 seconds   Quad Stretch Limitations prone with rope    Knee: Self-Stretch to increase Flexion Other (comment)   Knee: Self-Stretch Limitations 12 inch box, 10 second holds    Press photographer Both;3 reps;30 seconds   Gastroc Stretch Limitations slantboard    Knee/Hip Exercises: Aerobic   Other Aerobic 4105f speed walking, 4556flkight jogging on even surface  with PT supervision    Knee/Hip Exercises: Standing   Lateral Step Up Right;20 reps;Hand Hold: 0;Step Height: 8"   Forward Step Up Right;20 reps;Step Height: 8"   Step Down Right;20 reps;Hand Hold: 1;Step Height: 8"   Functional Squat 5 reps   Functional Squat Limitations on BOSU 5 reps 7 different foot position   Lunge Walking - Round Trips  2RT x93f    Gait Training 700 feet speed walking to light jog   Other Standing Knee Exercises Hip ABD walks 2RT BTB   Other Standing Knee Exercises single leg balance reaches 3 directions 1x10 right leg with 5 second hold each direction    Knee/Hip Exercises: Seated   Stool Scoot - Round Trips 375fR LE only              PT Short Term Goals - 01/03/15 1441    PT SHORT TERM GOAL #1   Title Patient will demonstrate R knee ROM of 0-120 degrees with no pain   Baseline 10/10- continues to be limited by capsular tightness. ROM 2-118   Time 3    Period Weeks   Status On-going   PT SHORT TERM GOAL #2   Title Patient will demonstrate at least 4-/5 strength in R knee and at least 4+/5 strength in L LE    Time 3   Period Weeks   Status On-going   PT SHORT TERM GOAL #3   Title Patient to be ambulating unlimited distances without crutches and WBAT R LE, equal step lengths, equal weight bearing each side, minimal unsteadiness, pain no more than 1/10   Baseline Met with some partial swelling thereafter, but tolerated well.    Time 3   Period Weeks   Status On-going   PT SHORT TERM GOAL #4   Title Patient will experience 0/10 pain R knee  with all functional weight bearing tasks and exercises    Time 3   Period Weeks   Status Achieved   PT SHORT TERM GOAL #5   Title Patient to be independent in consistently and correctly performing  appropriate HEP, to be updated PRN    Baseline 08/29/216:  Reports compliance daily   Time 3   Period Weeks   Status Achieved           PT Long Term Goals - 12/24/14 1225    PT LONG TERM GOAL #1   Title Patient to have 0 degrees extension  and 130 degrees flexion R knee, pain 0/10   Time 6   Period Weeks   Status On-going   PT LONG TERM GOAL #2   Title Patient will be able to perform single leg squat to floor with R knee, minimal knee valgus, and pain 0/10   Time 6   Period Weeks   Status On-going   PT LONG TERM GOAL #3   Title Patient will be able to perform single leg hops of equal distance with bilateral lower extremities and pain 0/10 R knee    Time 6   Period Weeks   Status On-going   PT LONG TERM GOAL #4   Title Patient to be able to jump off of at least 14 inch box and land on bilateral feet with good technique, good knee positioning with minimal knee valgus, and pain R knee 0/10   Time 6   Period Weeks   Status On-going   PT LONG TERM GOAL #5   Title Patient to be able to maintain single leg stance on BOSU for at least 30 seconds with mild to moderate perturbations, pain R knee  0/10   Time 6   Period Weeks   Status On-going   PT LONG TERM GOAL #6   Title Patient will demonstrate the ability to safely ascend and descend full height ladder and crawl around obstacles with pain R knee 0/10   Time  6   Period Weeks   Status Achieved               Plan - 01/14/15 1104    Clinical Impression Statement Pt at 13 weeks post-op.  Continued session focus on improving functional strengthening for gluts, quad and hamstring muscualture.  Began squats on BOSU in diffierent plans for glut and quad strengthening, noted visible musculature fatigue with activity.  No reports of increased pain through session just fatigue from activity.     PT Next Visit Plan Pt 13 weeks post-op progress patient per protocol if appropriate (ROM currently 1-118 and quads strength at 48% not quite met the criteria for phase III).  Continue agressive quad and hamstring strengthening per protocol.  Next session resume treadmill for gait training and 1 rep max for quad and hamstring        Problem List There are no active problems to display for this patient.  [address], [place]; CBIS [id number]'  Aldona Lento 01/14/2015, 12:37 PM  [place], [place], [address] Phone: [phone]   Fax:  [phone]  Name: Ariel Parker MRN: [id number] Date of Birth: [birth year]

## 2015-01-17 ENCOUNTER — Ambulatory Visit (HOSPITAL_COMMUNITY): Payer: Federal, State, Local not specified - PPO | Admitting: Physical Therapy

## 2015-01-17 DIAGNOSIS — M25561 Pain in right knee: Secondary | ICD-10-CM

## 2015-01-17 DIAGNOSIS — R262 Difficulty in walking, not elsewhere classified: Secondary | ICD-10-CM

## 2015-01-17 DIAGNOSIS — M25661 Stiffness of right knee, not elsewhere classified: Secondary | ICD-10-CM | POA: Diagnosis not present

## 2015-01-17 DIAGNOSIS — Z9889 Other specified postprocedural states: Secondary | ICD-10-CM

## 2015-01-17 DIAGNOSIS — R2681 Unsteadiness on feet: Secondary | ICD-10-CM

## 2015-01-17 DIAGNOSIS — R269 Unspecified abnormalities of gait and mobility: Secondary | ICD-10-CM

## 2015-01-17 NOTE — Therapy (Signed)
[place], [place], [address] Phone: [phone]   Fax:  [phone]  Physical Therapy Treatment  Patient Details  Name: Prestyn Mahn MRN: [id number] Date of Birth: [birth year] Referring Provider: Dr. Percell Miller  Encounter Date: 01/17/2015      PT End of Session - 01/17/15 1659    Visit Number 34   Number of Visits 36   Date for PT Re-Evaluation 01/23/15   Authorization Type BCBS/ [employer] secondary (PROGRESS NOTE MUST BE DONE WEEKLY FOR MILITARY, give to patient each Friday)   Authorization Time Period 12/26/14-02/25/15   PT Start Time 1020   PT Stop Time 1120   PT Time Calculation (min) 60 min   Activity Tolerance Patient tolerated treatment well   Behavior During Therapy [facility] for tasks assessed/performed      Past Medical History  Diagnosis Date  . Disruption of anterior cruciate ligament of right knee 09/2014    Past Surgical History  Procedure Laterality Date  . Wisdom tooth extraction    . Knee arthroscopy w/ acl reconstruction Left 07/17/2006  . Knee arthroscopy with anterior cruciate ligament (acl) repair with hamstring graft Right 10/14/2014    Procedure: RIGHT KNEE ARTHROSCOPY WITH ANTERIOR CRUCIATE LIGAMENT (ACL) REPAIR;  Surgeon: Kathryne Hitch, MD;  Location: [place];  Service: Orthopedics;  Laterality: Right;  . Knee arthroscopy with lateral menisectomy Right 10/14/2014    Procedure: KNEE ARTHROSCOPY WITH LATERAL MENISECTOMY;  Surgeon: Kathryne Hitch, MD;  Location: [place];  Service: Orthopedics;  Laterality: Right;    There were no vitals filed for this visit.  Visit Diagnosis:  Knee stiffness, right  Right knee pain  Unsteadiness  Abnormality of gait  Difficulty walking  S/P ACL repair      Subjective Assessment - 01/17/15 1644    Subjective PT reports no soreness or pain, however states swelling from all the hiking she did over the weekend in [place].    Currently in Pain? No/denies            [facility] PT Assessment - 01/17/15 1658    Assessment   Medical Diagnosis s/p Rt ACL repair    Onset Date/Surgical Date 10/14/14   Other:   Other/ Comments Quadricep 1RM:Rt36#,Lt62# (58%)   Other:   Other/Comments Hamstring 1RM: Rt49.5#, Lt74.5# (66%)   AROM   Right Knee Extension 3   Right Knee Flexion 118                     [facility] Adult PT Treatment/Exercise - 01/17/15 1030    Knee/Hip Exercises: Stretches   Active Hamstring Stretch 3 reps;30 seconds;Both   Active Hamstring Stretch Limitations 12 inch box    Knee: Self-Stretch to increase Flexion Other (comment)   Knee: Self-Stretch Limitations 12 inch box, 10 second holds    Gastroc Stretch Both;3 reps;30 seconds   Gastroc Stretch Limitations slantboard    Knee/Hip Exercises: Aerobic   Tread Mill 10 minutes on treadmill 3.85mh with interval jogging at 4.0   Knee/Hip Exercises: Machines for Strengthening   Cybex Knee Extension 2Pl Rt only 10 reps   Cybex Knee Flexion 3.5Pl Lt only 10 reps   Other Machine Biodex isokinetic 150 <>120 <>90 10 reps each   Knee/Hip Exercises: Plyometrics   Bilateral Jumping 10 reps   Bilateral Jumping Limitations in place, A/P, Rt/Lt   Knee/Hip Exercises: Standing   Lateral Step Up Right;Hand Hold: 0;Step Height: 8";15 reps   Lateral Step  Up Limitations 8 inch box with airex    Forward Step Up Right;Step Height: 8";15 reps   Forward Step Up Limitations 8 inch box with airex   Step Down Right;Hand Hold: 1;Step Height: 8";15 reps   Step Down Limitations 8 inch box with airex   Lunge Walking - Round Trips 2RT on balance beam                  PT Short Term Goals - 01/03/15 1441    PT SHORT TERM GOAL #1   Title Patient will demonstrate R knee ROM of 0-120 degrees with no pain   Baseline 10/10- continues to be limited by capsular tightness. ROM 2-118   Time 3   Period Weeks   Status On-going   PT SHORT TERM GOAL #2   Title  Patient will demonstrate at least 4-/5 strength in R knee and at least 4+/5 strength in L LE    Time 3   Period Weeks   Status On-going   PT SHORT TERM GOAL #3   Title Patient to be ambulating unlimited distances without crutches and WBAT R LE, equal step lengths, equal weight bearing each side, minimal unsteadiness, pain no more than 1/10   Baseline Met with some partial swelling thereafter, but tolerated well.    Time 3   Period Weeks   Status On-going   PT SHORT TERM GOAL #4   Title Patient will experience 0/10 pain R knee  with all functional weight bearing tasks and exercises    Time 3   Period Weeks   Status Achieved   PT SHORT TERM GOAL #5   Title Patient to be independent in consistently and correctly performing  appropriate HEP, to be updated PRN    Baseline 08/29/216:  Reports compliance daily   Time 3   Period Weeks   Status Achieved           PT Long Term Goals - 12/24/14 1225    PT LONG TERM GOAL #1   Title Patient to have 0 degrees extension  and 130 degrees flexion R knee, pain 0/10   Time 6   Period Weeks   Status On-going   PT LONG TERM GOAL #2   Title Patient will be able to perform single leg squat to floor with R knee, minimal knee valgus, and pain 0/10   Time 6   Period Weeks   Status On-going   PT LONG TERM GOAL #3   Title Patient will be able to perform single leg hops of equal distance with bilateral lower extremities and pain 0/10 R knee    Time 6   Period Weeks   Status On-going   PT LONG TERM GOAL #4   Title Patient to be able to jump off of at least 14 inch box and land on bilateral feet with good technique, good knee positioning with minimal knee valgus, and pain R knee 0/10   Time 6   Period Weeks   Status On-going   PT LONG TERM GOAL #5   Title Patient to be able to maintain single leg stance on BOSU for at least 30 seconds with mild to moderate perturbations, pain R knee 0/10   Time 6   Period Weeks   Status On-going   PT LONG TERM  GOAL #6   Title Patient will demonstrate the ability to safely ascend and descend full height ladder and crawl around obstacles with pain R knee 0/10   Time  6   Period Weeks   Status Achieved               Plan - 01/17/15 1706    Clinical Impression Statement ROM currently 3-118.  1 RM taken today with quads at 58% (was 48%) and hamstrings 66%.  PT lagging behind protocol as should be in phase 4 and only beginning phase 3.  Concentration at this point should be on quad and hamstring strength with continuation of pushing flexion of knee.  Began bilateral LE hpping in place, Rt/Lt and A/P today.  PRogressed step ups with addition of airex pad on 8" step and began stregnthening with cybex quad and hamstting.  Pt able to complete without increased pain.    PT Next Visit Plan Progress per phase 3 of ACL protocol. Increase to 30 second holds for wallsquats, multipositional BOSU squats.  Agressive quad and hamstring strengthening per protocol.     Recommended Other Services ACL protocol.  Date of surgery 7/21.  13 weeks 10/20, 14 weeks 10/27        Problem List There are no active problems to display for this patient.   Teena Irani, PTA/CLT [phone]  01/17/2015, 5:24 PM  [place] [address] [place], [place], [address] Phone: [phone]   Fax:  [phone]  Name: Keigan Girten MRN: [id number] Date of Birth: 26-Dec-1990

## 2015-01-19 ENCOUNTER — Ambulatory Visit (HOSPITAL_COMMUNITY): Payer: Federal, State, Local not specified - PPO | Admitting: Physical Therapy

## 2015-01-19 DIAGNOSIS — M25661 Stiffness of right knee, not elsewhere classified: Secondary | ICD-10-CM

## 2015-01-19 DIAGNOSIS — Z9889 Other specified postprocedural states: Secondary | ICD-10-CM

## 2015-01-19 DIAGNOSIS — R262 Difficulty in walking, not elsewhere classified: Secondary | ICD-10-CM

## 2015-01-19 DIAGNOSIS — M25561 Pain in right knee: Secondary | ICD-10-CM

## 2015-01-19 DIAGNOSIS — R269 Unspecified abnormalities of gait and mobility: Secondary | ICD-10-CM

## 2015-01-19 DIAGNOSIS — R2681 Unsteadiness on feet: Secondary | ICD-10-CM

## 2015-01-19 NOTE — Therapy (Signed)
[place], [place], [address] Phone: [phone]   Fax:  [phone]  Physical Therapy Treatment  Patient Details  Name: Ariel Parker MRN: [id number] Date of Birth: 05-04-1990 Referring Provider: Dr. Percell Miller  Encounter Date: 01/19/2015      PT End of Session - 01/19/15 1300    Visit Number 35   Number of Visits 36   Date for PT Re-Evaluation 01/23/15   Authorization Type BCBS/ [employer] secondary (PROGRESS NOTE MUST BE DONE WEEKLY FOR MILITARY, give to patient each Friday)   Authorization Time Period 12/26/14-02/25/15   PT Start Time 1015   PT Stop Time 1120   PT Time Calculation (min) 65 min   Activity Tolerance Patient tolerated treatment well   Behavior During Therapy [facility] for tasks assessed/performed      Past Medical History  Diagnosis Date  . Disruption of anterior cruciate ligament of right knee 09/2014    Past Surgical History  Procedure Laterality Date  . Wisdom tooth extraction    . Knee arthroscopy w/ acl reconstruction Left 07/17/2006  . Knee arthroscopy with anterior cruciate ligament (acl) repair with hamstring graft Right 10/14/2014    Procedure: RIGHT KNEE ARTHROSCOPY WITH ANTERIOR CRUCIATE LIGAMENT (ACL) REPAIR;  Surgeon: Kathryne Hitch, MD;  Location: [place];  Service: Orthopedics;  Laterality: Right;  . Knee arthroscopy with lateral menisectomy Right 10/14/2014    Procedure: KNEE ARTHROSCOPY WITH LATERAL MENISECTOMY;  Surgeon: Kathryne Hitch, MD;  Location: [place];  Service: Orthopedics;  Laterality: Right;    There were no vitals filed for this visit.  Visit Diagnosis:  Knee stiffness, right  Right knee pain  Unsteadiness  Abnormality of gait  Difficulty walking  S/P ACL repair      Subjective Assessment - 01/19/15 1018    Subjective No soreness and no pain today, however minimal swelling over her patellar tendon since she went hiking over the  weekend.    Currently in Pain? No/denies                         [facility] Adult PT Treatment/Exercise - 01/19/15 1021    Knee/Hip Exercises: Stretches   Active Hamstring Stretch 3 reps;30 seconds;Both   Active Hamstring Stretch Limitations 12 inch box    Knee: Self-Stretch to increase Flexion Other (comment)   Knee: Self-Stretch Limitations 12 inch box, 10 second holds    Gastroc Stretch Both;3 reps;30 seconds   Gastroc Stretch Limitations slantboard    Knee/Hip Exercises: Aerobic   Tread Mill 10 minutes on treadmill 3.87mh with interval jogging at 4.0   Knee/Hip Exercises: Machines for Strengthening   Cybex Knee Extension 2Pl Rt only 10 X 2 sets   Cybex Knee Flexion 3.5Pl Lt only 10 X 2 sets   Other Machine Biodex isokinetic 150 <>120 <>90 10 reps each   Knee/Hip Exercises: Plyometrics   Bilateral Jumping 10 reps   Bilateral Jumping Limitations in place, A/P, Rt/Lt   Knee/Hip Exercises: Standing   Functional Squat 5 reps   Functional Squat Limitations on BOSU 5 reps 7 different foot position   Wall Squat 5 sets   Wall Squat Limitations 30 seconds   Lunge Walking - Round Trips 2RT on balance beam   Modalities   Modalities Cryotherapy   Cryotherapy   Number Minutes Cryotherapy 5 Minutes   Cryotherapy Location Knee  Rt patellar tendon   Type of Cryotherapy Ice massage  PT Short Term Goals - 01/03/15 1441    PT SHORT TERM GOAL #1   Title Patient will demonstrate R knee ROM of 0-120 degrees with no pain   Baseline 10/10- continues to be limited by capsular tightness. ROM 2-118   Time 3   Period Weeks   Status On-going   PT SHORT TERM GOAL #2   Title Patient will demonstrate at least 4-/5 strength in R knee and at least 4+/5 strength in L LE    Time 3   Period Weeks   Status On-going   PT SHORT TERM GOAL #3   Title Patient to be ambulating unlimited distances without crutches and WBAT R LE, equal step lengths, equal weight bearing  each side, minimal unsteadiness, pain no more than 1/10   Baseline Met with some partial swelling thereafter, but tolerated well.    Time 3   Period Weeks   Status On-going   PT SHORT TERM GOAL #4   Title Patient will experience 0/10 pain R knee  with all functional weight bearing tasks and exercises    Time 3   Period Weeks   Status Achieved   PT SHORT TERM GOAL #5   Title Patient to be independent in consistently and correctly performing  appropriate HEP, to be updated PRN    Baseline 08/29/216:  Reports compliance daily   Time 3   Period Weeks   Status Achieved           PT Long Term Goals - 12/24/14 1225    PT LONG TERM GOAL #1   Title Patient to have 0 degrees extension  and 130 degrees flexion R knee, pain 0/10   Time 6   Period Weeks   Status On-going   PT LONG TERM GOAL #2   Title Patient will be able to perform single leg squat to floor with R knee, minimal knee valgus, and pain 0/10   Time 6   Period Weeks   Status On-going   PT LONG TERM GOAL #3   Title Patient will be able to perform single leg hops of equal distance with bilateral lower extremities and pain 0/10 R knee    Time 6   Period Weeks   Status On-going   PT LONG TERM GOAL #4   Title Patient to be able to jump off of at least 14 inch box and land on bilateral feet with good technique, good knee positioning with minimal knee valgus, and pain R knee 0/10   Time 6   Period Weeks   Status On-going   PT LONG TERM GOAL #5   Title Patient to be able to maintain single leg stance on BOSU for at least 30 seconds with mild to moderate perturbations, pain R knee 0/10   Time 6   Period Weeks   Status On-going   PT LONG TERM GOAL #6   Title Patient will demonstrate the ability to safely ascend and descend full height ladder and crawl around obstacles with pain R knee 0/10   Time 6   Period Weeks   Status Achieved               Plan - 01/19/15 1300    Clinical Impression Statement Progressing  well with little aggrevation, however pointed to patellar tendon reporting some discomfort following last session.  Added ice massage at end of session and encouraged patient to complete this at home as well.  Resumed multipositional BOSU squats and wallsits up to  30 seconds.  Pt verbalized LE fatique at end of session.    PT Next Visit Plan Progress per phase 3 of ACL protocol.   Agressive quad and hamstring strengthening per protocol.  Re-evaluate next session.        Problem List There are no active problems to display for this patient.   Teena Irani, PTA/CLT [phone] 01/19/2015, 1:41 PM  [place] [address] [place], [place], [address] Phone: [phone]   Fax:  [phone]  Name: Kimyetta Flott MRN: [id number] Date of Birth: 1990/09/26

## 2015-01-21 ENCOUNTER — Ambulatory Visit (HOSPITAL_COMMUNITY): Payer: Federal, State, Local not specified - PPO | Admitting: Physical Therapy

## 2015-01-21 DIAGNOSIS — M25561 Pain in right knee: Secondary | ICD-10-CM

## 2015-01-21 DIAGNOSIS — R2681 Unsteadiness on feet: Secondary | ICD-10-CM

## 2015-01-21 DIAGNOSIS — R269 Unspecified abnormalities of gait and mobility: Secondary | ICD-10-CM

## 2015-01-21 DIAGNOSIS — R262 Difficulty in walking, not elsewhere classified: Secondary | ICD-10-CM

## 2015-01-21 DIAGNOSIS — M25661 Stiffness of right knee, not elsewhere classified: Secondary | ICD-10-CM

## 2015-01-21 NOTE — Therapy (Signed)
[facility] [address] [place], [place], [address] Phone: [phone]   Fax:  [phone]  Physical Therapy Treatment  Patient Details  Name: Ariel Parker MRN: [id number] Date of Birth: 21-Nov-1990 Referring Provider: Dr. Eulah Pont  Encounter Date: 01/21/2015      PT End of Session - 01/21/15 1202    Visit Number 36   Number of Visits 46   Date for PT Re-Evaluation 02/21/15   Authorization Type BCBS/ [employer] secondary (PROGRESS NOTE MUST BE DONE WEEKLY FOR MILITARY, give to patient each Friday)   Authorization Time Period 12/26/14-02/25/15   PT Start Time 1015   PT Stop Time 1104   PT Time Calculation (min) 49 min   Activity Tolerance Patient tolerated treatment well   Behavior During Therapy [facility] for tasks assessed/performed      Past Medical History  Diagnosis Date  . Disruption of anterior cruciate ligament of right knee 09/2014    Past Surgical History  Procedure Laterality Date  . Wisdom tooth extraction    . Knee arthroscopy w/ acl reconstruction Left 07/17/2006  . Knee arthroscopy with anterior cruciate ligament (acl) repair with hamstring graft Right 10/14/2014    Procedure: RIGHT KNEE ARTHROSCOPY WITH ANTERIOR CRUCIATE LIGAMENT (ACL) REPAIR;  Surgeon: Mckinley Jewel, MD;  Location: [facility];  Service: Orthopedics;  Laterality: Right;  . Knee arthroscopy with lateral menisectomy Right 10/14/2014    Procedure: KNEE ARTHROSCOPY WITH LATERAL MENISECTOMY;  Surgeon: Mckinley Jewel, MD;  Location: [facility];  Service: Orthopedics;  Laterality: Right;    There were no vitals filed for this visit.  Visit Diagnosis:  Knee stiffness, right  Right knee pain  Unsteadiness  Abnormality of gait  Difficulty walking      Subjective Assessment - 01/21/15 1020    Subjective Pt reports that she still has some swelling in her knee. She is wearing her sock today because her nerve pain flared up in her  leg. She reports that she has had less difficulty lately with running and with completing all of her exercises. She still has trouble with running for longer periods of time, but it is gettting better.    How long can you sit comfortably? no limitations   How long can you stand comfortably? 4+ hours   How long can you walk comfortably? 4+ hours   Currently in Pain? No/denies   Pain Score 0-No pain            [facility] PT Assessment - 01/21/15 0001    AROM   Right Knee Extension 0  0 degrees achieved following manual therapy   Right Knee Flexion 120   Strength   Right Hip Flexion 5/5   Right Hip Extension 4+/5  was 4   Right Hip ABduction 5/5   Left Hip Flexion 5/5   Left Hip Extension 5/5  was 4+   Left Hip ABduction 5/5   Right Knee Flexion 4/5   Right Knee Extension 4/5  was 4-   Left Knee Flexion 5/5   Left Knee Extension 5/5                     [facility] Adult PT Treatment/Exercise - 01/21/15 0001    Knee/Hip Exercises: Stretches   Active Hamstring Stretch 3 reps;30 seconds;Both   Active Hamstring Stretch Limitations 12 inch box    Knee: Self-Stretch to increase Flexion Other (comment)   Knee: Self-Stretch Limitations 12 inch box, 10 second holds  Gastroc Stretch Both;3 reps;30 seconds   Gastroc Stretch Limitations slantboard    Knee/Hip Exercises: Plyometrics   Bilateral Jumping 10 reps   Bilateral Jumping Limitations in place, A/P, Rt/Lt   Knee/Hip Exercises: Standing   Functional Squat 5 reps   Functional Squat Limitations on BOSU 5 reps 7 different foot position   Other Standing Knee Exercises Single leg squats at 27" mat x 10   Manual Therapy   Joint Mobilization A/P glide of femur on tibia, grade III-IV to improve extension, tibial tilt mob to improve flexion, patellar mobs superior and lateral                   PT Short Term Goals - 01/21/15 1200    PT SHORT TERM GOAL #1   Title Patient will demonstrate R knee ROM of 0-120 degrees  with no pain   Baseline 10/28- 0-120 degrees achieved following joint mobilizations   Time 3   Period Weeks   Status Achieved   PT SHORT TERM GOAL #2   Title Patient will demonstrate at least 4-/5 strength in R knee and at least 4+/5 strength in L LE    Time 3   Period Weeks   Status Achieved   PT SHORT TERM GOAL #3   Title Patient to be ambulating unlimited distances without crutches and WBAT R LE, equal step lengths, equal weight bearing each side, minimal unsteadiness, pain no more than 1/10   Time 3   Period Weeks   Status Achieved   PT SHORT TERM GOAL #4   Title Patient will experience 0/10 pain R knee  with all functional weight bearing tasks and exercises    Time 3   Period Weeks   Status Achieved   PT SHORT TERM GOAL #5   Title Patient to be independent in consistently and correctly performing  appropriate HEP, to be updated PRN    Time 3   Period Weeks   Status Achieved           PT Long Term Goals - 01/21/15 1201    PT LONG TERM GOAL #1   Title Patient to have 0 degrees extension  and 130 degrees flexion R knee, pain 0/10   Time 6   Period Weeks   Status On-going   PT LONG TERM GOAL #2   Title Patient will be able to perform single leg squat to floor with R knee, minimal knee valgus, and pain 0/10   Time 6   Period Weeks   Status On-going   PT LONG TERM GOAL #3   Title Patient will be able to perform single leg hops of equal distance with bilateral lower extremities and pain 0/10 R knee    Time 6   Period Weeks   Status On-going   PT LONG TERM GOAL #4   Title Patient to be able to jump off of at least 14 inch box and land on bilateral feet with good technique, good knee positioning with minimal knee valgus, and pain R knee 0/10   Time 6   Period Weeks   Status On-going   PT LONG TERM GOAL #5   Title Patient to be able to maintain single leg stance on BOSU for at least 30 seconds with mild to moderate perturbations, pain R knee 0/10   Time 6   Period  Weeks   Status On-going   PT LONG TERM GOAL #6   Title Patient will demonstrate the ability to safely  ascend and descend full height ladder and crawl around obstacles with pain R knee 0/10   Time 6   Period Weeks   Status On-going               Plan - 01/21/15 1204    Clinical Impression Statement Reassessment completed today. Pt has made steady gains towards her LTGs in regards to strength, ROM, and funcitonal activity tolerance. ROM prior to joint mobilizations was measured at 2-118, measurements taken after manual therapy were 0-120 degrees, indicating that pt's limitation in ROM is stemming from joint capsular tightness. Single leg squats were added today from elevated mat table, pt was able to complete with good form following verbal and visual cueing. Pt is able to complete double leg hopping activities without pain, and is appropriate to progress to single leg hopping activities. Pt will benefit from continued physical therapy services to focus on plyometric strengthening to allow for safe return to work.    Pt will benefit from skilled therapeutic intervention in order to improve on the following deficits Abnormal gait;Decreased coordination;Decreased range of motion;Difficulty walking;Impaired tone;Decreased safety awareness;Decreased activity tolerance;Pain;Decreased balance;Impaired flexibility;Improper body mechanics;Decreased mobility;Decreased strength;Increased edema;Postural dysfunction   PT Frequency 3x / week   PT Duration 4 weeks   PT Treatment/Interventions ADLs/Self Care Home Management;Cryotherapy;Electrical Stimulation;DME Instruction;Gait training;Stair training;Functional mobility training;Therapeutic activities;Therapeutic exercise;Balance training;Neuromuscular re-education;Patient/family education;Manual techniques;Passive range of motion   PT Next Visit Plan Progress single leg squats to lower surface, begin single leg straight-plane hopping, progress lunges with  sport cord        Problem List There are no active problems to display for this patient.   Physical Therapy Progress Note  Dates of Reporting Period: 12/24/14 to 01/21/15  Objective Reports of Subjective Statement: Pt demonstrates improved ROM to 0-120 degrees, has 4/5 or greater strength in RLE, and has begun plyometric training to prepare for return to work.     Objective Measurements: see above  Goal Update: see above  Plan: Continue with single leg stability and plyometric training.   Reason Skilled Services are Required: Pt will benefit from continued physical therapy services to focus on plyometric strengthening to allow for safe return to work.     Leona SingletonLauren Arushi Partridge, PT, DPT [phone] 01/21/2015, 12:10 PM  [place] [facility] [address] [place], [place], [address] Phone: [phone]   Fax:  [phone]  Name: Ariel Parker MRN: [id number] Date of Birth: [birth year]

## 2015-01-24 ENCOUNTER — Ambulatory Visit (HOSPITAL_COMMUNITY): Payer: Federal, State, Local not specified - PPO | Admitting: Physical Therapy

## 2015-01-24 DIAGNOSIS — M25661 Stiffness of right knee, not elsewhere classified: Secondary | ICD-10-CM

## 2015-01-24 DIAGNOSIS — R2681 Unsteadiness on feet: Secondary | ICD-10-CM

## 2015-01-24 DIAGNOSIS — M25561 Pain in right knee: Secondary | ICD-10-CM

## 2015-01-24 DIAGNOSIS — R262 Difficulty in walking, not elsewhere classified: Secondary | ICD-10-CM

## 2015-01-24 NOTE — Therapy (Signed)
[facility] [address] [place], [place], [address] Phone: [phone]   Fax:  [phone]  Physical Therapy Treatment  Patient Details  Name: Ariel Parker MRN: [id number] Date of Birth: 1990-06-21 Referring Provider: Dr. Eulah Pont  Encounter Date: 01/24/2015      PT End of Session - 01/24/15 0858    Visit Number 37   Number of Visits 46   Date for PT Re-Evaluation 02/21/15   Authorization Type BCBS/ [employer] secondary (PROGRESS NOTE MUST BE DONE WEEKLY FOR MILITARY, give to patient each Friday)   Authorization Time Period 12/26/14-02/25/15   PT Start Time 0801   PT Stop Time 0840   PT Time Calculation (min) 39 min   Activity Tolerance Patient tolerated treatment well   Behavior During Therapy [facility] for tasks assessed/performed      Past Medical History  Diagnosis Date  . Disruption of anterior cruciate ligament of right knee 09/2014    Past Surgical History  Procedure Laterality Date  . Wisdom tooth extraction    . Knee arthroscopy w/ acl reconstruction Left 07/17/2006  . Knee arthroscopy with anterior cruciate ligament (acl) repair with hamstring graft Right 10/14/2014    Procedure: RIGHT KNEE ARTHROSCOPY WITH ANTERIOR CRUCIATE LIGAMENT (ACL) REPAIR;  Surgeon: Mckinley Jewel, MD;  Location: [facility];  Service: Orthopedics;  Laterality: Right;  . Knee arthroscopy with lateral menisectomy Right 10/14/2014    Procedure: KNEE ARTHROSCOPY WITH LATERAL MENISECTOMY;  Surgeon: Mckinley Jewel, MD;  Location: [facility];  Service: Orthopedics;  Laterality: Right;    There were no vitals filed for this visit.  Visit Diagnosis:  Knee stiffness, right  Right knee pain  Unsteadiness  Difficulty walking      Subjective Assessment - 01/24/15 0806    Subjective Pt reports that her knee feels a little stiff today, but the swelling has gone down.    Currently in Pain? No/denies   Pain Score 0-No pain                          [facility] Adult PT Treatment/Exercise - 01/24/15 0001    Knee/Hip Exercises: Stretches   Active Hamstring Stretch 3 reps;30 seconds;Both   Active Hamstring Stretch Limitations 12 inch box    Knee: Self-Stretch to increase Flexion Other (comment)   Knee: Self-Stretch Limitations 12 inch box, 10 second holds    Gastroc Stretch Both;3 reps;30 seconds   Gastroc Stretch Limitations slantboard    Knee/Hip Exercises: Plyometrics   Bilateral Jumping 10 reps   Bilateral Jumping Limitations A/P, R/L   Unilateral Jumping 10 reps   Unilateral Jumping Limitations in place with one UE support   Knee/Hip Exercises: Standing   Side Lunges 10 reps   Functional Squat 5 reps   Functional Squat Limitations on BOSU 5 reps 7 different foot position   Wall Squat 5 sets   Wall Squat Limitations 30 seconds   Other Standing Knee Exercises Single leg squats at 27" mat x 10   Other Standing Knee Exercises Monster walk with green tband x 2 RT forward and laterally                  PT Short Term Goals - 01/21/15 1200    PT SHORT TERM GOAL #1   Title Patient will demonstrate R knee ROM of 0-120 degrees with no pain   Baseline 10/28- 0-120 degrees achieved following joint mobilizations   Time 3  Period Weeks   Status Achieved   PT SHORT TERM GOAL #2   Title Patient will demonstrate at least 4-/5 strength in R knee and at least 4+/5 strength in L LE    Time 3   Period Weeks   Status Achieved   PT SHORT TERM GOAL #3   Title Patient to be ambulating unlimited distances without crutches and WBAT R LE, equal step lengths, equal weight bearing each side, minimal unsteadiness, pain no more than 1/10   Time 3   Period Weeks   Status Achieved   PT SHORT TERM GOAL #4   Title Patient will experience 0/10 pain R knee  with all functional weight bearing tasks and exercises    Time 3   Period Weeks   Status Achieved   PT SHORT TERM GOAL #5   Title Patient to be  independent in consistently and correctly performing  appropriate HEP, to be updated PRN    Time 3   Period Weeks   Status Achieved           PT Long Term Goals - 01/21/15 1201    PT LONG TERM GOAL #1   Title Patient to have 0 degrees extension  and 130 degrees flexion R knee, pain 0/10   Time 6   Period Weeks   Status On-going   PT LONG TERM GOAL #2   Title Patient will be able to perform single leg squat to floor with R knee, minimal knee valgus, and pain 0/10   Time 6   Period Weeks   Status On-going   PT LONG TERM GOAL #3   Title Patient will be able to perform single leg hops of equal distance with bilateral lower extremities and pain 0/10 R knee    Time 6   Period Weeks   Status On-going   PT LONG TERM GOAL #4   Title Patient to be able to jump off of at least 14 inch box and land on bilateral feet with good technique, good knee positioning with minimal knee valgus, and pain R knee 0/10   Time 6   Period Weeks   Status On-going   PT LONG TERM GOAL #5   Title Patient to be able to maintain single leg stance on BOSU for at least 30 seconds with mild to moderate perturbations, pain R knee 0/10   Time 6   Period Weeks   Status On-going   PT LONG TERM GOAL #6   Title Patient will demonstrate the ability to safely ascend and descend full height ladder and crawl around obstacles with pain R knee 0/10   Time 6   Period Weeks   Status On-going               Plan - 01/24/15 0859    Clinical Impression Statement Side lunges, unilateral hops, and monster walks were added today to improve hip/knee stability and plyometric strength. Pt required verbal, visual, and tactile cueing to complete side lunges with proper form and isolation of correct musculature. Unilateral hops in place were added with one hand support, pt reported some nervousness, but no pain with the exercise. Monster walks with green tband were done forward and laterally to improve abductor strength to  decrease stress on knee. Pt denied any increased pain post treatment, session ended 5 minutes early so pt could leave for MD appt.    PT Next Visit Plan Continue with single leg activities, add sport cord to walking lunges  Problem List There are no active problems to display for this patient.   Leona Singleton, PT, DPT [phone] 01/24/2015, 9:05 AM  [facility] [address] [place], [place], [address] Phone: [phone]   Fax:  [phone]  Name: Ariel Parker MRN: [id number] Date of Birth: 02/14/1991

## 2015-01-27 ENCOUNTER — Ambulatory Visit (HOSPITAL_COMMUNITY): Payer: Federal, State, Local not specified - PPO | Admitting: Physical Therapy

## 2015-02-01 ENCOUNTER — Ambulatory Visit (HOSPITAL_COMMUNITY): Payer: Federal, State, Local not specified - PPO | Attending: Orthopedic Surgery | Admitting: Physical Therapy

## 2015-02-01 DIAGNOSIS — R269 Unspecified abnormalities of gait and mobility: Secondary | ICD-10-CM

## 2015-02-01 DIAGNOSIS — R600 Localized edema: Secondary | ICD-10-CM

## 2015-02-01 DIAGNOSIS — Z9889 Other specified postprocedural states: Secondary | ICD-10-CM | POA: Diagnosis present

## 2015-02-01 DIAGNOSIS — M25661 Stiffness of right knee, not elsewhere classified: Secondary | ICD-10-CM

## 2015-02-01 DIAGNOSIS — R2681 Unsteadiness on feet: Secondary | ICD-10-CM

## 2015-02-01 DIAGNOSIS — R262 Difficulty in walking, not elsewhere classified: Secondary | ICD-10-CM | POA: Diagnosis present

## 2015-02-01 DIAGNOSIS — M25561 Pain in right knee: Secondary | ICD-10-CM

## 2015-02-01 NOTE — Therapy (Signed)
[facility] [address] [place], [place], [address] Phone: [phone]   Fax:  [phone]  Physical Therapy Treatment  Patient Details  Name: Ariel Parker MRN: [id number] Date of Birth: 22-Apr-1990 Referring Provider: Dr. Eulah Pont  Encounter Date: 02/01/2015      PT End of Session - 02/01/15 1050    Visit Number 38   Number of Visits 46   Authorization Type BCBS/ [employer] secondary (PROGRESS NOTE MUST BE DONE WEEKLY FOR MILITARY, give to patient each Friday)   PT Start Time 1000   PT Stop Time 1043   PT Time Calculation (min) 43 min   Activity Tolerance Patient tolerated treatment well      Past Medical History  Diagnosis Date  . Disruption of anterior cruciate ligament of right knee 09/2014    Past Surgical History  Procedure Laterality Date  . Wisdom tooth extraction    . Knee arthroscopy w/ acl reconstruction Left 07/17/2006  . Knee arthroscopy with anterior cruciate ligament (acl) repair with hamstring graft Right 10/14/2014    Procedure: RIGHT KNEE ARTHROSCOPY WITH ANTERIOR CRUCIATE LIGAMENT (ACL) REPAIR;  Surgeon: Mckinley Jewel, MD;  Location: [facility];  Service: Orthopedics;  Laterality: Right;  . Knee arthroscopy with lateral menisectomy Right 10/14/2014    Procedure: KNEE ARTHROSCOPY WITH LATERAL MENISECTOMY;  Surgeon: Mckinley Jewel, MD;  Location: [facility];  Service: Orthopedics;  Laterality: Right;    There were no vitals filed for this visit.  Visit Diagnosis:  Knee stiffness, right  Right knee pain  Unsteadiness  Difficulty walking  Abnormality of gait  S/P ACL repair  Localized edema      Subjective Assessment - 02/01/15 1010    Subjective Pt states she has no pain and her stiffness is better.  Pt is not having any difficulty with activity around the home but hiking continues to be more difficult than it was before the operation.      Pain=0        [facility] Adult PT  Treatment/Exercise - 02/01/15 0001    Knee/Hip Exercises: Standing   Functional Squat 10 reps   Functional Squat Limitations on Bosu; wide stance/ narrow stance; toes in, neutral and out.    Wall Squat 5 reps   Wall Squat Limitations 3 stops at 10" each both going down and coming up    Walking with Sports Cord double sports cord; forward, retro and sidestep x 2 RT down the hall    Knee/Hip Exercises: Prone   Other Prone Exercises quadriped opposite arm and leg with blue t-band for resistance.                 PT Education - 02/01/15 1049    Education provided Yes   Education Details for new exercises           PT Short Term Goals - 01/21/15 1200    PT SHORT TERM GOAL #1   Title Patient will demonstrate R knee ROM of 0-120 degrees with no pain   Baseline 10/28- 0-120 degrees achieved following joint mobilizations   Time 3   Period Weeks   Status Achieved   PT SHORT TERM GOAL #2   Title Patient will demonstrate at least 4-/5 strength in R knee and at least 4+/5 strength in L LE    Time 3   Period Weeks   Status Achieved   PT SHORT TERM GOAL #3   Title Patient to be ambulating unlimited distances  without crutches and WBAT R LE, equal step lengths, equal weight bearing each side, minimal unsteadiness, pain no more than 1/10   Time 3   Period Weeks   Status Achieved   PT SHORT TERM GOAL #4   Title Patient will experience 0/10 pain R knee  with all functional weight bearing tasks and exercises    Time 3   Period Weeks   Status Achieved   PT SHORT TERM GOAL #5   Title Patient to be independent in consistently and correctly performing  appropriate HEP, to be updated PRN    Time 3   Period Weeks   Status Achieved           PT Long Term Goals - 01/21/15 1201    PT LONG TERM GOAL #1   Title Patient to have 0 degrees extension  and 130 degrees flexion R knee, pain 0/10   Time 6   Period Weeks   Status On-going   PT LONG TERM GOAL #2   Title Patient will be able  to perform single leg squat to floor with R knee, minimal knee valgus, and pain 0/10   Time 6   Period Weeks   Status On-going   PT LONG TERM GOAL #3   Title Patient will be able to perform single leg hops of equal distance with bilateral lower extremities and pain 0/10 R knee    Time 6   Period Weeks   Status On-going   PT LONG TERM GOAL #4   Title Patient to be able to jump off of at least 14 inch box and land on bilateral feet with good technique, good knee positioning with minimal knee valgus, and pain R knee 0/10   Time 6   Period Weeks   Status On-going   PT LONG TERM GOAL #5   Title Patient to be able to maintain single leg stance on BOSU for at least 30 seconds with mild to moderate perturbations, pain R knee 0/10   Time 6   Period Weeks   Status On-going   PT LONG TERM GOAL #6   Title Patient will demonstrate the ability to safely ascend and descend full height ladder and crawl around obstacles with pain R knee 0/10   Time 6   Period Weeks   Status On-going               Plan - 02/01/15 1050    Clinical Impression Statement Pt 15' late for appoint ment , thought her appointment was at 9:45 not 9:30.  Pt introduced to sports cord walking using both sports cords, increased intensity of wall squats with multiple holds, added resisted quadriped exercises with verbal cuing needed for proper technique of exercises.    PT Next Visit Plan begin lunging wth sports cord.         Problem List There are no active problems to display for this patient. Virgina OrganCynthia Shaneika Rossa, PT CLT [phone] 02/01/2015, 10:54 AM  [facility] [address] [place], [place], [address] Phone: [phone]   Fax:  [phone]  Name: Hiram Gashrista Arredondo MRN: [id number] Date of Birth: 09/25/1990

## 2015-02-02 ENCOUNTER — Ambulatory Visit (HOSPITAL_COMMUNITY): Payer: Federal, State, Local not specified - PPO | Admitting: Physical Therapy

## 2015-02-02 DIAGNOSIS — M25661 Stiffness of right knee, not elsewhere classified: Secondary | ICD-10-CM | POA: Diagnosis not present

## 2015-02-02 DIAGNOSIS — R262 Difficulty in walking, not elsewhere classified: Secondary | ICD-10-CM

## 2015-02-02 DIAGNOSIS — Z9889 Other specified postprocedural states: Secondary | ICD-10-CM

## 2015-02-02 DIAGNOSIS — R269 Unspecified abnormalities of gait and mobility: Secondary | ICD-10-CM

## 2015-02-02 DIAGNOSIS — R2681 Unsteadiness on feet: Secondary | ICD-10-CM

## 2015-02-02 DIAGNOSIS — M25561 Pain in right knee: Secondary | ICD-10-CM

## 2015-02-02 DIAGNOSIS — R600 Localized edema: Secondary | ICD-10-CM

## 2015-02-02 NOTE — Telephone Encounter (Signed)
Called concerning apt date and time  Casey Cockerham, LPTA; CBIS [phone]  

## 2015-02-02 NOTE — Therapy (Signed)
[facility] [address] [place], [place], [address] Phone: [phone]   Fax:  [phone]  Physical Therapy Treatment  Patient Details  Name: Ariel Parker MRN: [id number] Date of Birth: October 26, 1990 Referring Provider: Dr. Eulah Pont  Encounter Date: 02/02/2015      PT End of Session - 02/02/15 0943    Visit Number 39   Number of Visits 46   Authorization Type BCBS/ Tric[employer] secondary (PROGRESS NOTE MUST BE DONE WEEKLY FOR MILITARY, give to patient each Friday)   PT Start Time 0800   PT Stop Time 0900   PT Time Calculation (min) 60 min   Activity Tolerance Patient tolerated treatment well   Behavior During Therapy [facility] for tasks assessed/performed      Past Medical History  Diagnosis Date  . Disruption of anterior cruciate ligament of right knee 09/2014    Past Surgical History  Procedure Laterality Date  . Wisdom tooth extraction    . Knee arthroscopy w/ acl reconstruction Left 07/17/2006  . Knee arthroscopy with anterior cruciate ligament (acl) repair with hamstring graft Right 10/14/2014    Procedure: RIGHT KNEE ARTHROSCOPY WITH ANTERIOR CRUCIATE LIGAMENT (ACL) REPAIR;  Surgeon: Mckinley Jewel, MD;  Location: [facility];  Service: Orthopedics;  Laterality: Right;  . Knee arthroscopy with lateral menisectomy Right 10/14/2014    Procedure: KNEE ARTHROSCOPY WITH LATERAL MENISECTOMY;  Surgeon: Mckinley Jewel, MD;  Location: [facility];  Service: Orthopedics;  Laterality: Right;    There were no vitals filed for this visit.  Visit Diagnosis:  Right knee pain  Unsteadiness  Knee stiffness, right  Difficulty walking  Abnormality of gait  S/P ACL repair  Localized edema      Subjective Assessment - 02/02/15 0942    Subjective Pt states she is headed to [place] for a family trip after therapy today.  STates her Rt knee is not hurting, however her Lt one has some crepitus and tenderness at times.    Pertinent History Patient was playing soccer, jumped up and landed and heard a pop. ACL surgery was the 21st of July. Things at home have been going OK but at one point MD thought there might have been a blood clot, which did come back negative. Was taken off of CPM due to blood clot concern and to her knowledge is not going to be put back on CPM.    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 No/denies                         [facility] Adult PT Treatment/Exercise - 02/02/15 0804    Knee/Hip Exercises: Stretches   Active Hamstring Stretch 3 reps;30 seconds;Both   Active Hamstring Stretch Limitations 12 inch box    Knee: Self-Stretch to increase Flexion Other (comment)   Knee: Self-Stretch Limitations 12 inch box, 10 second holds    Gastroc Stretch Both;3 reps;30 seconds   Gastroc Stretch Limitations slantboard    Knee/Hip Exercises: Aerobic   Elliptical 5 minutes forward    Tread Mill 5 minutes on treadmill 3. with interval jogging at 4.0   Knee/Hip Exercises: Machines for Strengthening   Cybex Knee Extension 2Pl Rt only 10 X 2 sets   Cybex Knee Flexion 3.5Pl Lt only 10 X 2 sets   Knee/Hip Exercises: Plyometrics   Bilateral Jumping 10 reps  Bilateral Jumping Limitations A/P, R/L   Broad Jump 5 sets   Broad Jump Limitations max of 56.5 inches (4'7")   Knee/Hip Exercises: Standing   Functional Squat 10 reps   Functional Squat Limitations on Bosu; wide stance/ narrow stance; toes in, neutral and out.    Lunge Walking - Round Trips 2RT on floor   Other Standing Knee Exercises agility ladder drills X 8 minutes                PT Education - 02/01/15 1049    Education provided Yes   Education Details for new exercises           PT Short Term Goals - 01/21/15 1200    PT SHORT TERM GOAL #1   Title Patient will demonstrate R knee ROM of 0-120 degrees with no  pain   Baseline 10/28- 0-120 degrees achieved following joint mobilizations   Time 3   Period Weeks   Status Achieved   PT SHORT TERM GOAL #2   Title Patient will demonstrate at least 4-/5 strength in R knee and at least 4+/5 strength in L LE    Time 3   Period Weeks   Status Achieved   PT SHORT TERM GOAL #3   Title Patient to be ambulating unlimited distances without crutches and WBAT R LE, equal step lengths, equal weight bearing each side, minimal unsteadiness, pain no more than 1/10   Time 3   Period Weeks   Status Achieved   PT SHORT TERM GOAL #4   Title Patient will experience 0/10 pain R knee  with all functional weight bearing tasks and exercises    Time 3   Period Weeks   Status Achieved   PT SHORT TERM GOAL #5   Title Patient to be independent in consistently and correctly performing  appropriate HEP, to be updated PRN    Time 3   Period Weeks   Status Achieved           PT Long Term Goals - 01/21/15 1201    PT LONG TERM GOAL #1   Title Patient to have 0 degrees extension  and 130 degrees flexion R knee, pain 0/10   Time 6   Period Weeks   Status On-going   PT LONG TERM GOAL #2   Title Patient will be able to perform single leg squat to floor with R knee, minimal knee valgus, and pain 0/10   Time 6   Period Weeks   Status On-going   PT LONG TERM GOAL #3   Title Patient will be able to perform single leg hops of equal distance with bilateral lower extremities and pain 0/10 R knee    Time 6   Period Weeks   Status On-going   PT LONG TERM GOAL #4   Title Patient to be able to jump off of at least 14 inch box and land on bilateral feet with good technique, good knee positioning with minimal knee valgus, and pain R knee 0/10   Time 6   Period Weeks   Status On-going   PT LONG TERM GOAL #5   Title Patient to be able to maintain single leg stance on BOSU for at least 30 seconds with mild to moderate perturbations, pain R knee 0/10   Time 6   Period Weeks    Status On-going   PT LONG TERM GOAL #6   Title Patient will demonstrate the ability to safely ascend and descend full  height ladder and crawl around obstacles with pain R knee 0/10   Time 6   Period Weeks   Status On-going               Plan - 02/02/15 0943    Clinical Impression Statement Resumed cybex strengthening and plyometrics for bilateral LE's per protocol.  Unable to complete isokinetic strengthening due to room unavailable. PRogressed with agility drills and also resumed slow jog on treadmill.  Pt able to complete all actvities without pain.    PT Next Visit Plan Progress per ACL protocol at 16 weeks currently. Single leg strengthening and agility.  Resume isokinetics next session.  1RM needs to be completed soon.        Problem List There are no active problems to display for this patient.   Lurena Nida, PTA/CLT [phone]  02/02/2015, 9:52 AM  [facility] [address] [place], [place], [address] Phone: [phone]   Fax:  [phone]  Name: Ariel Parker MRN: [id number] Date of Birth: August 27, 1990

## 2015-02-03 ENCOUNTER — Encounter (HOSPITAL_COMMUNITY): Admitting: Physical Therapy

## 2015-02-07 ENCOUNTER — Ambulatory Visit (HOSPITAL_COMMUNITY): Payer: Federal, State, Local not specified - PPO | Admitting: Physical Therapy

## 2015-02-09 ENCOUNTER — Ambulatory Visit (HOSPITAL_COMMUNITY): Payer: Federal, State, Local not specified - PPO

## 2015-02-09 DIAGNOSIS — Z9889 Other specified postprocedural states: Secondary | ICD-10-CM

## 2015-02-09 DIAGNOSIS — R262 Difficulty in walking, not elsewhere classified: Secondary | ICD-10-CM

## 2015-02-09 DIAGNOSIS — R600 Localized edema: Secondary | ICD-10-CM

## 2015-02-09 DIAGNOSIS — M25561 Pain in right knee: Secondary | ICD-10-CM

## 2015-02-09 DIAGNOSIS — R2681 Unsteadiness on feet: Secondary | ICD-10-CM

## 2015-02-09 DIAGNOSIS — M25661 Stiffness of right knee, not elsewhere classified: Secondary | ICD-10-CM | POA: Diagnosis not present

## 2015-02-09 DIAGNOSIS — R269 Unspecified abnormalities of gait and mobility: Secondary | ICD-10-CM

## 2015-02-09 NOTE — Therapy (Signed)
[facility] [address] [place], [place], [address] Phone: [phone]   Fax:  [phone]  Physical Therapy Treatment  Patient Details  Name: Ariel Parker MRN: [id number] Date of Birth: [birth year] Referring Provider: Dr. Eulah PontMurphy  Encounter Date: 02/09/2015      PT End of Session - 02/09/15 1022    Visit Number 40   Number of Visits 46   Date for PT Re-Evaluation 02/21/15   Authorization Type BCBS/ [employer] secondary (PROGRESS NOTE MUST BE DONE WEEKLY FOR MILITARY, give to patient each Friday)   Authorization Time Period 12/26/14-02/25/15   PT Start Time 1018   PT Stop Time 1107   PT Time Calculation (min) 49 min   Activity Tolerance Patient tolerated treatment well   Behavior During Therapy [facility] for tasks assessed/performed      Past Medical History  Diagnosis Date  . Disruption of anterior cruciate ligament of right knee 09/2014    Past Surgical History  Procedure Laterality Date  . Wisdom tooth extraction    . Knee arthroscopy w/ acl reconstruction Left 07/17/2006  . Knee arthroscopy with anterior cruciate ligament (acl) repair with hamstring graft Right 10/14/2014    Procedure: RIGHT KNEE ARTHROSCOPY WITH ANTERIOR CRUCIATE LIGAMENT (ACL) REPAIR;  Surgeon: Mckinley Jewelaniel Murphy, MD;  Location: [facility];  Service: Orthopedics;  Laterality: Right;  . Knee arthroscopy with lateral menisectomy Right 10/14/2014    Procedure: KNEE ARTHROSCOPY WITH LATERAL MENISECTOMY;  Surgeon: Mckinley Jewelaniel Murphy, MD;  Location: [facility];  Service: Orthopedics;  Laterality: Right;    There were no vitals filed for this visit.  Visit Diagnosis:  Right knee pain  Unsteadiness  Knee stiffness, right  Difficulty walking  Abnormality of gait  S/P ACL repair  Localized edema      Subjective Assessment - 02/09/15 1020    Subjective Pt [place], Knee is feeling good today   Currently in Pain? No/denies            [facility] - 02/09/15 0001    Knee/Hip Exercises: Stretches   Active Hamstring Stretch 3 reps;30 seconds;Both   Active Hamstring Stretch Limitations 12 inch box    Knee: Self-Stretch to increase Flexion Other (comment)   Knee: Self-Stretch Limitations 12 inch box, 10 second holds    Gastroc Stretch Both;3 reps;30 seconds   Gastroc Stretch Limitations slantboard    Knee/Hip Exercises: Aerobic   Elliptical 5 minutes forward    Tread Mill 1 minute incline, 4 minutes jogging at 5.5   Knee/Hip Exercises: Machines for Strengthening   Cybex Knee Extension 1 rep max at Rt 4Pl (36) and Lt at 7Pl (70) 51% (difficulty with TKE at 4 Pl)   Cybex Knee Flexion 1 rep max Rt 6Pl (57.5#) and Lt 7.5 (74.5#) 77%   Other Machine Biodex isokinetic 150 <>120 <>90 10 reps each   Knee/Hip Exercises: Standing   Functional Squat 10 reps   Functional Squat Limitations on Bosu; wide stance/ narrow stance; toes in, neutral and out.    Lunge Walking - Round Trips 2RT on floor   Other Standing Knee Exercises agility ladder drills X 8 minutes                  PT Short Term Goals - 01/21/15 1200    PT SHORT TERM GOAL #1   Title Patient will demonstrate R knee ROM of 0-120 degrees with no pain   Baseline 10/28- 0-120 degrees  achieved following joint mobilizations   Time 3   Period Weeks   Status Achieved   PT SHORT TERM GOAL #2   Title Patient will demonstrate at least 4-/5 strength in R knee and at least 4+/5 strength in L LE    Time 3   Period Weeks   Status Achieved   PT SHORT TERM GOAL #3   Title Patient to be ambulating unlimited distances without crutches and WBAT R LE, equal step lengths, equal weight bearing each side, minimal unsteadiness, pain no more than 1/10   Time 3   Period Weeks   Status Achieved   PT SHORT TERM GOAL #4   Title Patient will experience 0/10 pain R knee  with all functional weight bearing tasks and exercises    Time 3   Period  Weeks   Status Achieved   PT SHORT TERM GOAL #5   Title Patient to be independent in consistently and correctly performing  appropriate HEP, to be updated PRN    Time 3   Period Weeks   Status Achieved           PT Long Term Goals - 01/21/15 1201    PT LONG TERM GOAL #1   Title Patient to have 0 degrees extension  and 130 degrees flexion R knee, pain 0/10   Time 6   Period Weeks   Status On-going   PT LONG TERM GOAL #2   Title Patient will be able to perform single leg squat to floor with R knee, minimal knee valgus, and pain 0/10   Time 6   Period Weeks   Status On-going   PT LONG TERM GOAL #3   Title Patient will be able to perform single leg hops of equal distance with bilateral lower extremities and pain 0/10 R knee    Time 6   Period Weeks   Status On-going   PT LONG TERM GOAL #4   Title Patient to be able to jump off of at least 14 inch box and land on bilateral feet with good technique, good knee positioning with minimal knee valgus, and pain R knee 0/10   Time 6   Period Weeks   Status On-going   PT LONG TERM GOAL #5   Title Patient to be able to maintain single leg stance on BOSU for at least 30 seconds with mild to moderate perturbations, pain R knee 0/10   Time 6   Period Weeks   Status On-going   PT LONG TERM GOAL #6   Title Patient will demonstrate the ability to safely ascend and descend full height ladder and crawl around obstacles with pain R knee 0/10   Time 6   Period Weeks   Status On-going               Plan - 02/09/15 1159    Clinical Impression Statement 1 Rep max complete with quad at 51% and hamstrings at 77%.  Pt continues to have extreme difficulty with terminal knee extension, 2 degrees lagging.  Session focus on strengthening with isokinetic machines, plyometrics and agility drills for proprioception and strengthening.  Pt able to jog for 4 minutes without rest breaks today at 5. speed.  LImited by fatigue at end of session, no  reports of pain.     PT Next Visit Plan Progress per ACL protocol at 16 weeks currently. Single leg strengthening and agility.         Problem List There are no  active problems to display for this patient.  [address] LPTA; CBIS [id number]  Juel Burrow 02/09/2015, 12:05 PM  [facility] [address] [place], [place], [address] Phone: [phone]   Fax:  [phone]  Name: Laasia Arcos MRN: [id number] Date of Birth: 03-07-1991

## 2015-02-11 ENCOUNTER — Ambulatory Visit (HOSPITAL_COMMUNITY): Payer: Federal, State, Local not specified - PPO | Admitting: Physical Therapy

## 2015-02-11 DIAGNOSIS — R2681 Unsteadiness on feet: Secondary | ICD-10-CM

## 2015-02-11 DIAGNOSIS — M25561 Pain in right knee: Secondary | ICD-10-CM

## 2015-02-11 DIAGNOSIS — M25661 Stiffness of right knee, not elsewhere classified: Secondary | ICD-10-CM | POA: Diagnosis not present

## 2015-02-11 DIAGNOSIS — R269 Unspecified abnormalities of gait and mobility: Secondary | ICD-10-CM

## 2015-02-11 DIAGNOSIS — R262 Difficulty in walking, not elsewhere classified: Secondary | ICD-10-CM

## 2015-02-11 NOTE — Therapy (Signed)
[place] [facility] [address] [place], [place], [address] Phone: [phone]   Fax:  [phone]  Physical Therapy Treatment  Patient Details  Name: Ariel Parker MRN: [id number] Date of Birth: [birth year] Referring Provider: Dr. Eulah Pont  Encounter Date: 02/11/2015      PT End of Session - 02/11/15 1208    Visit Number 41   Number of Visits 46   Date for PT Re-Evaluation 02/21/15   Authorization Type BCBS/ [employer] secondary (PROGRESS NOTE MUST BE DONE WEEKLY FOR MILITARY, give to patient each Friday)   Authorization Time Period 12/26/14-02/25/15   PT Start Time 0933   PT Stop Time 1016   PT Time Calculation (min) 43 min   Activity Tolerance Patient tolerated treatment well   Behavior During Therapy [facility] for tasks assessed/performed      Past Medical History  Diagnosis Date  . Disruption of anterior cruciate ligament of right knee 09/2014    Past Surgical History  Procedure Laterality Date  . Wisdom tooth extraction    . Knee arthroscopy w/ acl reconstruction Left 07/17/2006  . Knee arthroscopy with anterior cruciate ligament (acl) repair with hamstring graft Right 10/14/2014    Procedure: RIGHT KNEE ARTHROSCOPY WITH ANTERIOR CRUCIATE LIGAMENT (ACL) REPAIR;  Surgeon: Mckinley Jewel, MD;  Location: [facility];  Service: Orthopedics;  Laterality: Right;  . Knee arthroscopy with lateral menisectomy Right 10/14/2014    Procedure: KNEE ARTHROSCOPY WITH LATERAL MENISECTOMY;  Surgeon: Mckinley Jewel, MD;  [facility];  Service: Orthopedics;  Laterality: Right;    There were no vitals filed for this visit.  Visit Diagnosis:  Right knee pain  Unsteadiness  Knee stiffness, right  Difficulty walking  Abnormality of gait      Subjective Assessment - 02/11/15 0934    Subjective Pt reports that she had some swelling in her knee yesterday so she didn't do much.   Currently in Pain? No/denies   Pain Score  0-No pain            [facility] PT Assessment - 02/11/15 0001    AROM   Right Knee Extension -2  0 degrees achieved passively   Right Knee Flexion 118  122 achieved passively                     [facility] Adult PT Treatment/Exercise - 02/11/15 0001    Exercises   Exercises Knee/Hip   Knee/Hip Exercises: Stretches   Active Hamstring Stretch 3 reps;30 seconds;Both   Active Hamstring Stretch Limitations 12 inch box    Gastroc Stretch Both;3 reps;30 seconds   Gastroc Stretch Limitations slantboard    Knee/Hip Exercises: Plyometrics   Other Plyometric Exercises squat jumps x 10   Other Plyometric Exercises jumps off 8" box x 10   Knee/Hip Exercises: Standing   Functional Squat 5 reps   Functional Squat Limitations on BOSU 5 reps 7 different foot position  with pertubations from PT   Wall Squat 2 sets   Wall Squat Limitations --   Other Standing Knee Exercises agility ladder drills X 8 minutes   Other Standing Knee Exercises Squat hold on BOSU with ball toss   Knee/Hip Exercises: Supine   Bridges 10 reps   Bridges Limitations bridge with HS curl on swiss ball   Single Leg Bridge 5 reps;Both;Limitations  on swiss ball   Knee/Hip Exercises: Sidelying   Hip ABduction 10 reps   Hip ABduction Limitations x10 with heel against wall,  x10 with resisted extension heel against wall   Manual Therapy   Manual therapy comments performed following therex   Myofascial Release muscle play to hamstrings, hamstring splitting in supine                  PT Short Term Goals - 01/21/15 1200    PT SHORT TERM GOAL #1   Title Patient will demonstrate R knee ROM of 0-120 degrees with no pain   Baseline 10/28- 0-120 degrees achieved following joint mobilizations   Time 3   Period Weeks   Status Achieved   PT SHORT TERM GOAL #2   Title Patient will demonstrate at least 4-/5 strength in R knee and at least 4+/5 strength in L LE    Time 3   Period Weeks   Status Achieved   PT  SHORT TERM GOAL #3   Title Patient to be ambulating unlimited distances without crutches and WBAT R LE, equal step lengths, equal weight bearing each side, minimal unsteadiness, pain no more than 1/10   Time 3   Period Weeks   Status Achieved   PT SHORT TERM GOAL #4   Title Patient will experience 0/10 pain R knee  with all functional weight bearing tasks and exercises    Time 3   Period Weeks   Status Achieved   PT SHORT TERM GOAL #5   Title Patient to be independent in consistently and correctly performing  appropriate HEP, to be updated PRN    Time 3   Period Weeks   Status Achieved           PT Long Term Goals - 01/21/15 1201    PT LONG TERM GOAL #1   Title Patient to have 0 degrees extension  and 130 degrees flexion R knee, pain 0/10   Time 6   Period Weeks   Status On-going   PT LONG TERM GOAL #2   Title Patient will be able to perform single leg squat to floor with R knee, minimal knee valgus, and pain 0/10   Time 6   Period Weeks   Status On-going   PT LONG TERM GOAL #3   Title Patient will be able to perform single leg hops of equal distance with bilateral lower extremities and pain 0/10 R knee    Time 6   Period Weeks   Status On-going   PT LONG TERM GOAL #4   Title Patient to be able to jump off of at least 14 inch box and land on bilateral feet with good technique, good knee positioning with minimal knee valgus, and pain R knee 0/10   Time 6   Period Weeks   Status On-going   PT LONG TERM GOAL #5   Title Patient to be able to maintain single leg stance on BOSU for at least 30 seconds with mild to moderate perturbations, pain R knee 0/10   Time 6   Period Weeks   Status On-going   PT LONG TERM GOAL #6   Title Patient will demonstrate the ability to safely ascend and descend full height ladder and crawl around obstacles with pain R knee 0/10   Time 6   Period Weeks   Status On-going               Plan - 02/11/15 1209    Clinical Impression  Statement Treatment session focused on strengthening of quads, hamstrings, and hip abductors in today's treatment. Plyometric squat jumps and jumps  off of 8" box were added, with verbal cues given to maintain proper hip and knee alignment and to complete with proper form. Pertubations were added to squats on BOSU today in order to improve proprioception, pt was able to maintain proper form with each squat position without verbal cueing. Pt's ROM measured today, able to achieve 0-122 with PROM. Pt demonstrates increased tightness in R hamstrings, manual therapy performed post treatment to improve soft tissue mobility.    PT Next Visit Plan Continue with plyometrics, add single leg squat holds        Problem List There are no active problems to display for this patient.   Leona SingletonLauren Lilliona Blakeney, PT, DPT [phone] 02/11/2015, 12:18 PM  [facility] [address] [place], [place], [address] Phone: [phone]   Fax:  [phone]  Name: Hiram Gashrista Micheli MRN: [id number] Date of Birth: 10-16-1990

## 2015-02-14 ENCOUNTER — Ambulatory Visit (HOSPITAL_COMMUNITY): Payer: Federal, State, Local not specified - PPO | Admitting: Physical Therapy

## 2015-02-14 DIAGNOSIS — R269 Unspecified abnormalities of gait and mobility: Secondary | ICD-10-CM

## 2015-02-14 DIAGNOSIS — R2681 Unsteadiness on feet: Secondary | ICD-10-CM

## 2015-02-14 DIAGNOSIS — M25561 Pain in right knee: Secondary | ICD-10-CM

## 2015-02-14 DIAGNOSIS — R600 Localized edema: Secondary | ICD-10-CM

## 2015-02-14 DIAGNOSIS — R262 Difficulty in walking, not elsewhere classified: Secondary | ICD-10-CM

## 2015-02-14 DIAGNOSIS — M25661 Stiffness of right knee, not elsewhere classified: Secondary | ICD-10-CM | POA: Diagnosis not present

## 2015-02-14 DIAGNOSIS — Z9889 Other specified postprocedural states: Secondary | ICD-10-CM

## 2015-02-14 NOTE — Therapy (Signed)
[facility] [address] [place], [place], [address] Phone: [phone]   Fax:  [phone]  Physical Therapy Treatment  Patient Details  Name: Ariel Parker MRN: [id number] Date of Birth: Jan 03, 1991 Referring Provider: Dr. Eulah Pont  Encounter Date: 02/14/2015      PT End of Session - 02/14/15 1218    Visit Number 42   Number of Visits 46   Date for PT Re-Evaluation 02/21/15   Authorization Type BCBS/ [employer] secondary (PROGRESS NOTE MUST BE DONE WEEKLY FOR MILITARY, give to patient each Friday)   Authorization Time Period 12/26/14-02/25/15   PT Start Time 1020   PT Stop Time 1115   PT Time Calculation (min) 55 min   Activity Tolerance Patient tolerated treatment well   Behavior During Therapy [facility] for tasks assessed/performed      Past Medical History  Diagnosis Date  . Disruption of anterior cruciate ligament of right knee 09/2014    Past Surgical History  Procedure Laterality Date  . Wisdom tooth extraction    . Knee arthroscopy w/ acl reconstruction Left 07/17/2006  . Knee arthroscopy with anterior cruciate ligament (acl) repair with hamstring graft Right 10/14/2014    Procedure: RIGHT KNEE ARTHROSCOPY WITH ANTERIOR CRUCIATE LIGAMENT (ACL) REPAIR;  Surgeon: Mckinley Jewel, MD;  Location: [facility];  Service: Orthopedics;  Laterality: Right;  . Knee arthroscopy with lateral menisectomy Right 10/14/2014    Procedure: KNEE ARTHROSCOPY WITH LATERAL MENISECTOMY;  Surgeon: Mckinley Jewel, MD;  Location: [facility];  Service: Orthopedics;  Laterality: Right;    There were no vitals filed for this visit.  Visit Diagnosis:  Right knee pain  Unsteadiness  Knee stiffness, right  Difficulty walking  Abnormality of gait  S/P ACL repair  Localized edema      Subjective Assessment - 02/14/15 1222    Subjective PT states she was a little sore after last session.  Currently without without pain or  soreness.    Currently in Pain? No/denies                         [facility] Adult PT Treatment/Exercise - 02/14/15 1026    Knee/Hip Exercises: Stretches   Active Hamstring Stretch 3 reps;30 seconds;Both   Active Hamstring Stretch Limitations 12 inch box    Knee: Self-Stretch to increase Flexion Other (comment)   Knee: Self-Stretch Limitations 12 inch box, 10 second holds    Gastroc Stretch Both;3 reps;30 seconds   Gastroc Stretch Limitations slantboard    Knee/Hip Exercises: Aerobic   Tread Mill 10 minutes, 4 minutes jogging at 5.   Knee/Hip Exercises: Machines for Strengthening   Cybex Knee Extension 2Pl Rt only 2sets 10 reps   Cybex Knee Flexion 3.5 pl Rt only 2 sets 10 reps   Knee/Hip Exercises: Plyometrics   Bilateral Jumping 10 reps   Bilateral Jumping Limitations A/P, R/L   Broad Jump 5 sets   Other Plyometric Exercises squat jumps x 10   Other Plyometric Exercises jumps off 8" box x 10   Knee/Hip Exercises: Standing   Forward Lunges Both;10 reps   Forward Lunges Limitations onto 4" box with BUE assist   Side Lunges Both;10 reps   Side Lunges Limitations onto 4   Functional Squat 10 reps   Functional Squat Limitations on BOSU 5 reps 7 different foot position                  PT Short Term  Goals - 01/21/15 1200    PT SHORT TERM GOAL #1   Title Patient will demonstrate R knee ROM of 0-120 degrees with no pain   Baseline 10/28- 0-120 degrees achieved following joint mobilizations   Time 3   Period Weeks   Status Achieved   PT SHORT TERM GOAL #2   Title Patient will demonstrate at least 4-/5 strength in R knee and at least 4+/5 strength in L LE    Time 3   Period Weeks   Status Achieved   PT SHORT TERM GOAL #3   Title Patient to be ambulating unlimited distances without crutches and WBAT R LE, equal step lengths, equal weight bearing each side, minimal unsteadiness, pain no more than 1/10   Time 3   Period Weeks   Status Achieved   PT  SHORT TERM GOAL #4   Title Patient will experience 0/10 pain R knee  with all functional weight bearing tasks and exercises    Time 3   Period Weeks   Status Achieved   PT SHORT TERM GOAL #5   Title Patient to be independent in consistently and correctly performing  appropriate HEP, to be updated PRN    Time 3   Period Weeks   Status Achieved           PT Long Term Goals - 01/21/15 1201    PT LONG TERM GOAL #1   Title Patient to have 0 degrees extension  and 130 degrees flexion R knee, pain 0/10   Time 6   Period Weeks   Status On-going   PT LONG TERM GOAL #2   Title Patient will be able to perform single leg squat to floor with R knee, minimal knee valgus, and pain 0/10   Time 6   Period Weeks   Status On-going   PT LONG TERM GOAL #3   Title Patient will be able to perform single leg hops of equal distance with bilateral lower extremities and pain 0/10 R knee    Time 6   Period Weeks   Status On-going   PT LONG TERM GOAL #4   Title Patient to be able to jump off of at least 14 inch box and land on bilateral feet with good technique, good knee positioning with minimal knee valgus, and pain R knee 0/10   Time 6   Period Weeks   Status On-going   PT LONG TERM GOAL #5   Title Patient to be able to maintain single leg stance on BOSU for at least 30 seconds with mild to moderate perturbations, pain R knee 0/10   Time 6   Period Weeks   Status On-going   PT LONG TERM GOAL #6   Title Patient will demonstrate the ability to safely ascend and descend full height ladder and crawl around obstacles with pain R knee 0/10   Time 6   Period Weeks   Status On-going               Plan - 02/14/15 1219    Clinical Impression Statement Main focus today on quads and glutes.  PRogressed to 10 reps of multidirectional squats for total of 70 achieved on BOSU ball.  Pt with noted fatigue towards the end.  Resumed cybex strengthening machines and running on treadmill.  Also added  single leg squat on Rt LE with Lt LE in chair.  Pt able to complete with good form and without pain.    PT Next  Visit Plan Continue with plyometrics, add box circut beginning with 4" box and unilateral wall squat holds        Problem List There are no active problems to display for this patient.   Lurena Nida, PTA/CLT [id number]  02/14/2015, 12:23 PM  [facility] [address] [place], [place], [address] Phone: [phone]   Fax:  [phone]  Name: Ariel Parker MRN: [id number] Date of Birth: [birth year]

## 2015-02-16 ENCOUNTER — Ambulatory Visit (HOSPITAL_COMMUNITY): Payer: Federal, State, Local not specified - PPO | Admitting: Physical Therapy

## 2015-02-16 DIAGNOSIS — M25561 Pain in right knee: Secondary | ICD-10-CM

## 2015-02-16 DIAGNOSIS — M25661 Stiffness of right knee, not elsewhere classified: Secondary | ICD-10-CM

## 2015-02-16 DIAGNOSIS — R262 Difficulty in walking, not elsewhere classified: Secondary | ICD-10-CM

## 2015-02-16 DIAGNOSIS — R2681 Unsteadiness on feet: Secondary | ICD-10-CM

## 2015-02-16 NOTE — Therapy (Signed)
[facility] [address] [place], [place], [address] Phone: [phone]   Fax:  [phone]  Physical Therapy Treatment  Patient Details  Name: Ariel Parker MRN: [id number] Date of Birth: [birth year] Referring Provider: Dr. Eulah Pont  Encounter Date: 02/16/2015      PT End of Session - 02/16/15 1025    Visit Number 43   Number of Visits 46   Date for PT Re-Evaluation 02/21/15   Authorization Type BCBS/ T[employer] secondary (PROGRESS NOTE MUST BE DONE WEEKLY FOR MILITARY, give to patient each Friday)   Authorization Time Period 12/26/14-02/25/15   PT Start Time 0933   PT Stop Time 1021   PT Time Calculation (min) 48 min   Activity Tolerance Patient tolerated treatment well   Behavior During Therapy [facility] for tasks assessed/performed      Past Medical History  Diagnosis Date  . Disruption of anterior cruciate ligament of right knee 09/2014    Past Surgical History  Procedure Laterality Date  . Wisdom tooth extraction    . Knee arthroscopy w/ acl reconstruction Left 07/17/2006  . Knee arthroscopy with anterior cruciate ligament (acl) repair with hamstring graft Right 10/14/2014    Procedure: RIGHT KNEE ARTHROSCOPY WITH ANTERIOR CRUCIATE LIGAMENT (ACL) REPAIR;  Surgeon: Mckinley Jewel, MD;  Location: [facility];  Service: Orthopedics;  Laterality: Right;  . Knee arthroscopy with lateral menisectomy Right 10/14/2014    Procedure: KNEE ARTHROSCOPY WITH LATERAL MENISECTOMY;  Surgeon: Mckinley Jewel, MD;  Location: [facility];  Service: Orthopedics;  Laterality: Right;    There were no vitals filed for this visit.  Visit Diagnosis:  Right knee pain  Unsteadiness  Knee stiffness, right  Difficulty walking      Subjective Assessment - 02/16/15 0938    Subjective Pt reports that she is sore today, she thinks that the running makes her the most sore.    Currently in Pain? No/denies   Pain Score 0-No pain                          [facility] Adult PT Treatment/Exercise - 02/16/15 0001    Knee/Hip Exercises: Stretches   Active Hamstring Stretch 3 reps;30 seconds;Both   Active Hamstring Stretch Limitations 12 inch box    Gastroc Stretch Both;3 reps;30 seconds   Gastroc Stretch Limitations slantboard    Knee/Hip Exercises: Aerobic   Tread Mill 5 minutes 5.7 mph   Knee/Hip Exercises: Plyometrics   Broad Jump 3 sets   Broad Jump Limitations focus on soft landing and maintaining abduction   Other Plyometric Exercises box jump onto 8" box x 15   Knee/Hip Exercises: Standing   Functional Squat 10 reps   Functional Squat Limitations on BOSU 5 reps 7 different foot position   Other Standing Knee Exercises RDLs to mat table x 15   Manual Therapy   Manual therapy comments performed prior to therex   Joint Mobilization A/P glide of femur on tibia, grade III-IV to improve extension, tibial tilt mob to improve flexion, patellar mobs superior and lateral    Soft tissue mobilization quads and quadricep tendon                PT Education - 02/16/15 1021    Education provided Yes   Education Details Educated on [employer] and monster walks for [employer]) Educated Patient   Methods Explanation;Handout   Comprehension Verbalized understanding  PT Short Term Goals - 01/21/15 1200    PT SHORT TERM GOAL #1   Title Patient will demonstrate R knee ROM of 0-120 degrees with no pain   Baseline 10/28- 0-120 degrees achieved following joint mobilizations   Time 3   Period Weeks   Status Achieved   PT SHORT TERM GOAL #2   Title Patient will demonstrate at least 4-/5 strength in R knee and at least 4+/5 strength in L LE    Time 3   Period Weeks   Status Achieved   PT SHORT TERM GOAL #3   Title Patient to be ambulating unlimited distances without crutches and WBAT R LE, equal step lengths, equal weight bearing each side, minimal unsteadiness, pain no more than 1/10   Time 3    Period Weeks   Status Achieved   PT SHORT TERM GOAL #4   Title Patient will experience 0/10 pain R knee  with all functional weight bearing tasks and exercises    Time 3   Period Weeks   Status Achieved   PT SHORT TERM GOAL #5   Title Patient to be independent in consistently and correctly performing  appropriate HEP, to be updated PRN    Time 3   Period Weeks   Status Achieved           PT Long Term Goals - 01/21/15 1201    PT LONG TERM GOAL #1   Title Patient to have 0 degrees extension  and 130 degrees flexion R knee, pain 0/10   Time 6   Period Weeks   Status On-going   PT LONG TERM GOAL #2   Title Patient will be able to perform single leg squat to floor with R knee, minimal knee valgus, and pain 0/10   Time 6   Period Weeks   Status On-going   PT LONG TERM GOAL #3   Title Patient will be able to perform single leg hops of equal distance with bilateral lower extremities and pain 0/10 R knee    Time 6   Period Weeks   Status On-going   PT LONG TERM GOAL #4   Title Patient to be able to jump off of at least 14 inch box and land on bilateral feet with good technique, good knee positioning with minimal knee valgus, and pain R knee 0/10   Time 6   Period Weeks   Status On-going   PT LONG TERM GOAL #5   Title Patient to be able to maintain single leg stance on BOSU for at least 30 seconds with mild to moderate perturbations, pain R knee 0/10   Time 6   Period Weeks   Status On-going   PT LONG TERM GOAL #6   Title Patient will demonstrate the ability to safely ascend and descend full height ladder and crawl around obstacles with pain R knee 0/10   Time 6   Period Weeks   Status On-going               Plan - 02/16/15 1028    Clinical Impression Statement Treatment session began with manual therapy to improve joint mobility and soft tissue mobilty of R quads. Plyometric exercises were main focus in today's treatment. Pt required verbal and visual cueing during  broad jumping to maintain proper hip alignment during landing and beginning of jump, also required cueing to land softly in order to avoid increased stress on knee joint. Box jumps were added on 8" step,  with verbal cueing for landing and to maintain hip/knee alignment.    PT Next Visit Plan Add unilateral wall squat holds, continue with plyometrics        Problem List There are no active problems to display for this patient.   Leona SingletonLauren Allana Shrestha, PT, DPT [phone] 02/16/2015, 10:33 AM  [facility] [address] [place], [place], [address] Phone: [phone]   Fax:  [phone]  Name: Ariel Parker MRN: [id number] Date of Birth: May 30, 1990

## 2015-02-21 ENCOUNTER — Ambulatory Visit (HOSPITAL_COMMUNITY): Payer: Federal, State, Local not specified - PPO | Admitting: Physical Therapy

## 2015-02-23 ENCOUNTER — Ambulatory Visit (HOSPITAL_COMMUNITY): Payer: Federal, State, Local not specified - PPO | Admitting: Physical Therapy

## 2015-02-23 DIAGNOSIS — R262 Difficulty in walking, not elsewhere classified: Secondary | ICD-10-CM

## 2015-02-23 DIAGNOSIS — M25661 Stiffness of right knee, not elsewhere classified: Secondary | ICD-10-CM | POA: Diagnosis not present

## 2015-02-23 DIAGNOSIS — R2681 Unsteadiness on feet: Secondary | ICD-10-CM

## 2015-02-23 DIAGNOSIS — R269 Unspecified abnormalities of gait and mobility: Secondary | ICD-10-CM

## 2015-02-23 DIAGNOSIS — M25561 Pain in right knee: Secondary | ICD-10-CM

## 2015-02-23 NOTE — Therapy (Signed)
[facility] [address] [place], [place], [address] Phone: [phone]   Fax:  [phone]  Physical Therapy Treatment (Reassessment)  Patient Details  Name: Ariel Parker MRN: [id number] Date of Birth: [birth year] Referring Provider: Dr. Eulah PontMurphy  Encounter Date: 02/23/2015      PT End of Session - 02/23/15 1159    Visit Number 43   Number of Visits 60   Date for PT Re-Evaluation 03/25/15   Authorization Type BCBS/ [employer] secondary (PROGRESS NOTE MUST BE DONE WEEKLY FOR MILITARY, give to patient each Friday)   Authorization Time Period 12/26/14-04/25/15   PT Start Time 1015   PT Stop Time 1058   PT Time Calculation (min) 43 min   Activity Tolerance Patient tolerated treatment well   Behavior [facility] Therapy [facility] for tasks assessed/performed      Past Medical History  Diagnosis Date  . Disruption of anterior cruciate ligament of right knee 09/2014    Past Surgical History  Procedure Laterality Date  . Wisdom tooth extraction    . Knee arthroscopy w/ acl reconstruction Left 07/17/2006  . Knee arthroscopy with anterior cruciate ligament (acl) repair with hamstring graft Right 10/14/2014    Procedure: RIGHT KNEE ARTHROSCOPY WITH ANTERIOR CRUCIATE LIGAMENT (ACL) REPAIR;  Surgeon: Mckinley Jewelaniel Murphy, MD;  Location: [facility];  Service: Orthopedics;  Laterality: Right;  . Knee arthroscopy with lateral menisectomy Right 10/14/2014    Procedure: KNEE ARTHROSCOPY WITH LATERAL MENISECTOMY;  Surgeon: Mckinley Jewelaniel Murphy, MD;  Location: [facility];  Service: Orthopedics;  Laterality: Right;    There were no vitals filed for this visit.  Visit Diagnosis:  Right knee pain  Unsteadiness  Knee stiffness, right  Difficulty walking  Abnormality of gait      Subjective Assessment - 02/23/15 1020    Subjective Pt reports that she feels that she has improved overall. She reports that she feels stronger, she has had less  difficulty with running, and she no longer has any difficulty with walking or ascending/descending stairs. Her follow up MD appt is Feburary 6th, and she reports that her surgeon would like her to keep coming until that date. Pt reports that she feels that she is about 70% better, but she is worried about the intense activities she will need to do upon discharge.     Currently in Pain? No/denies   Pain Score 0-No pain            [facility] PT Assessment - 02/23/15 0001    Observation/Other Assessments   Focus on Therapeutic Outcomes (FOTO)  28% limited   AROM   Right Knee Extension -1  0 with PROM   Right Knee Flexion 124   Strength   Right Hip Flexion 5/5   Right Hip Extension 4+/5   Right Hip ABduction 5/5   Left Hip Flexion 5/5   Left Hip Extension 4+/5   Left Hip ABduction 5/5   Right Knee Flexion 4+/5   Right Knee Extension 4+/5   Left Knee Flexion 5/5   Left Knee Extension 5/5                     [facility] Adult PT Treatment/Exercise - 02/23/15 0001    Knee/Hip Exercises: Stretches   Active Hamstring Stretch 3 reps;30 seconds;Both   Active Hamstring Stretch Limitations 12 inch box    Gastroc Stretch Both;3 reps;30 seconds   Gastroc Stretch Limitations slantboard    Knee/Hip Exercises: Plyometrics   Unilateral  Jumping 10 reps   Unilateral Jumping Limitations forward   Broad Jump 4 sets   Broad Jump Limitations focus on soft landing and maintaining abduction   Knee/Hip Exercises: Standing   SLS on BOSU with min and mod pertubations, on BOSU with ball toss and weighted ball toss   Knee/Hip Exercises: Sidelying   Other Sidelying Knee/Hip Exercises eccentric hamstring: body weight lowering from kneeling position                  PT Short Term Goals - 02/23/15 1200    PT SHORT TERM GOAL #1   Title Patient will demonstrate R knee ROM of 0-120 degrees with no pain   Baseline 11/30- pt lacks 1 degree of extension actively   Time 3   Period Weeks    Status On-going   PT SHORT TERM GOAL #2   Title Patient will demonstrate at least 4-/5 strength in R knee and at least 4+/5 strength in L LE    Time 3   Period Weeks   Status Achieved   PT SHORT TERM GOAL #3   Title Patient to be ambulating unlimited distances without crutches and WBAT R LE, equal step lengths, equal weight bearing each side, minimal unsteadiness, pain no more than 1/10   Time 3   Period Weeks   Status Achieved   PT SHORT TERM GOAL #4   Title Patient will experience 0/10 pain R knee  with all functional weight bearing tasks and exercises    Time 3   Period Weeks   Status Achieved   PT SHORT TERM GOAL #5   Title Patient to be independent in consistently and correctly performing  appropriate HEP, to be updated PRN    Baseline 08/29/216:  Reports compliance daily   Time 3   Period Weeks   Status Achieved           PT Long Term Goals - 02/23/15 1202    PT LONG TERM GOAL #1   Title Patient to have 0 degrees extension  and 130 degrees flexion R knee, pain 0/10    Baseline 11/30:  1-124   Time 6   Period Weeks   Status On-going   PT LONG TERM GOAL #2   Title Patient will be able to perform single leg squat to floor with R knee, minimal knee valgus, and pain 0/10   Time 6   Period Weeks   Status On-going   PT LONG TERM GOAL #3   Title Patient will be able to perform single leg hops of equal distance with bilateral lower extremities and pain 0/10 R knee    Time 6   Period Weeks   Status On-going   PT LONG TERM GOAL #4   Title Patient to be able to jump off of at least 14 inch box and land on bilateral feet with good technique, good knee positioning with minimal knee valgus, and pain R knee 0/10   Time 6   Period Weeks   Status On-going   PT LONG TERM GOAL #5   Title Patient to be able to maintain single leg stance on BOSU for at least 30 seconds with mild to moderate perturbations, pain R knee 0/10   Time 6   Period Weeks   Status Achieved   Additional  Long Term Goals   Additional Long Term Goals Yes   PT LONG TERM GOAL #6   Title Patient will demonstrate the ability to safely ascend and  descend full height ladder and crawl around obstacles with pain R knee 0/10   Time 6   Period Weeks   Status On-going   PT LONG TERM GOAL #7   Title Pt will ambulate on uneven surface carrying 50 lbs in backpack to simulate [employer] training exercise.    Time 6   Period Weeks   Status New               Plan - 02/23/15 1204    Clinical Impression Statement Reassessment was completed today. Pt is making excellent progress towards her goals, showing improvements in balance, strength, and ROM. However, pt continues to lack full ROM that is necessary for her return to work, and she continues to demonstrate decreased stability of her R knee during jumping and squatting activities. Pt will be required to be carrying 50 pound rucksack over uneven terrain when she returns to work, and she will also need to be able to complete plyometric activities with full stability and strength of R knee. Pt will benefit from continued PT services to further address strength, ROM, and balance in order to allow pt to return to work without risk of reinjury.    PT Next Visit Plan Progress single leg hopping, continue with plyometric and eccentric strengthening        Problem List There are no active problems to display for this patient.   Leona Singleton, PT, DPT [phone] 02/23/2015, 12:09 PM  [facility] [address] [place], [place], [address] Phone: [phone]   Fax:  [phone]  Name: Ariel Parker MRN: [id number] Date of Birth: [birth year]

## 2015-02-25 ENCOUNTER — Ambulatory Visit (HOSPITAL_COMMUNITY): Payer: Federal, State, Local not specified - PPO | Attending: Orthopedic Surgery | Admitting: Physical Therapy

## 2015-02-25 DIAGNOSIS — R2681 Unsteadiness on feet: Secondary | ICD-10-CM | POA: Diagnosis present

## 2015-02-25 DIAGNOSIS — R262 Difficulty in walking, not elsewhere classified: Secondary | ICD-10-CM | POA: Insufficient documentation

## 2015-02-25 DIAGNOSIS — M25561 Pain in right knee: Secondary | ICD-10-CM | POA: Diagnosis present

## 2015-02-25 DIAGNOSIS — M25661 Stiffness of right knee, not elsewhere classified: Secondary | ICD-10-CM | POA: Insufficient documentation

## 2015-02-25 NOTE — Therapy (Signed)
[facility] [address] [place], [place], [address] Phone: [phone]   Fax:  [phone]  Physical Therapy Treatment  Patient Details  Name: Ariel Parker MRN: [id number] Date of Birth: 09/28/1990 Referring Provider: Dr. Eulah PontMurphy  Encounter Date: 02/25/2015      PT End of Session - 02/25/15 1657    Visit Number 44   Number of Visits 60   Date for PT Re-Evaluation 03/25/15   Authorization Type BCBS/ [employer] secondary (PROGRESS NOTE MUST BE DONE WEEKLY FOR MILITARY, give to patient each Friday)   Authorization Time Period 12/26/14-04/25/15   PT Start Time 1430   PT Stop Time 1515   PT Time Calculation (min) 45 min   Activity Tolerance Patient tolerated treatment well   Behavior [facility] Therapy [facility]      Past Medical History  Diagnosis Date  . Disruption of anterior cruciate ligament of right knee 09/2014    Past Surgical History  Procedure Laterality Date  . Wisdom tooth extraction    . Knee arthroscopy w/ acl reconstruction Left 07/17/2006  . Knee arthroscopy with anterior cruciate ligament (acl) repair with hamstring graft Right 10/14/2014    Procedure: RIGHT KNEE ARTHROSCOPY WITH ANTERIOR CRUCIATE LIGAMENT (ACL) REPAIR;  Surgeon: Mckinley Jewelaniel Murphy, MD;  Location: [facility];  Service: Orthopedics;  Laterality: Right;  . Knee arthroscopy with lateral menisectomy Right 10/14/2014    Procedure: KNEE ARTHROSCOPY WITH LATERAL MENISECTOMY;  Surgeon: Mckinley Jewelaniel Murphy, MD;  Location: [facility];  Service: Orthopedics;  Laterality: Right;    There were no vitals filed for this visit.  Visit Diagnosis:  Right knee pain  Unsteadiness  Knee stiffness, right  Difficulty walking      Subjective Assessment - 02/25/15 1437    Subjective Pt reports that her knee feels pretty good today. She reports that she had a lot of hamstring soreness after last session.    Currently in Pain?  No/denies   Pain Score 0-No pain                         [facility] Adult PT Treatment/Exercise - 02/25/15 0001    Knee/Hip Exercises: Stretches   Active Hamstring Stretch 3 reps;30 seconds;Both   Active Hamstring Stretch Limitations 12 inch box    Gastroc Stretch Both;3 reps;30 seconds   Gastroc Stretch Limitations slantboard    Knee/Hip Exercises: Plyometrics   Unilateral Jumping 10 reps   Unilateral Jumping Limitations forward   Broad Jump 2 sets   Broad Jump Limitations focus on soft landing and maintaining abduction   Other Plyometric Exercises box jump onto 8" box x 15   Knee/Hip Exercises: Standing   Heel Raises 20 reps   Heel Raises Limitations single leg off of 6" step   Lunge Walking - Round Trips 60' x 2 with sport cord  squat abduction walk 60' x 2 with sport cord   Other Standing Knee Exercises single leg mini squat hold with ball toss, with ball toss and pertubations   Other Standing Knee Exercises RDLs to chair x 15   Knee/Hip Exercises: Sidelying   Other Sidelying Knee/Hip Exercises Nordic hamstring: eccentric body weight lowering from kneeling position x8                  PT Short Term Goals - 02/23/15 1200    PT SHORT TERM GOAL #1   Title Patient will demonstrate R knee ROM of 0-120  degrees with no pain   Baseline 11/30- pt lacks 1 degree of extension actively   Time 3   Period Weeks   Status On-going   PT SHORT TERM GOAL #2   Title Patient will demonstrate at least 4-/5 strength in R knee and at least 4+/5 strength in L LE    Time 3   Period Weeks   Status Achieved   PT SHORT TERM GOAL #3   Title Patient to be ambulating unlimited distances without crutches and WBAT R LE, equal step lengths, equal weight bearing each side, minimal unsteadiness, pain no more than 1/10   Time 3   Period Weeks   Status Achieved   PT SHORT TERM GOAL #4   Title Patient will experience 0/10 pain R knee  with all functional weight bearing tasks and  exercises    Time 3   Period Weeks   Status Achieved   PT SHORT TERM GOAL #5   Title Patient to be independent in consistently and correctly performing  appropriate HEP, to be updated PRN    Baseline 08/29/216:  Reports compliance daily   Time 3   Period Weeks   Status Achieved           PT Long Term Goals - 02/23/15 1202    PT LONG TERM GOAL #1   Title Patient to have 0 degrees extension  and 130 degrees flexion R knee, pain 0/10    Baseline 11/30:  1-124   Time 6   Period Weeks   Status On-going   PT LONG TERM GOAL #2   Title Patient will be able to perform single leg squat to floor with R knee, minimal knee valgus, and pain 0/10   Time 6   Period Weeks   Status On-going   PT LONG TERM GOAL #3   Title Patient will be able to perform single leg hops of equal distance with bilateral lower extremities and pain 0/10 R knee    Time 6   Period Weeks   Status On-going   PT LONG TERM GOAL #4   Title Patient to be able to jump off of at least 14 inch box and land on bilateral feet with good technique, good knee positioning with minimal knee valgus, and pain R knee 0/10   Time 6   Period Weeks   Status On-going   PT LONG TERM GOAL #5   Title Patient to be able to maintain single leg stance on BOSU for at least 30 seconds with mild to moderate perturbations, pain R knee 0/10   Time 6   Period Weeks   Status Achieved   Additional Long Term Goals   Additional Long Term Goals Yes   PT LONG TERM GOAL #6   Title Patient will demonstrate the ability to safely ascend and descend full height ladder and crawl around obstacles with pain R knee 0/10   Time 6   Period Weeks   Status On-going   PT LONG TERM GOAL #7   Title Pt will ambulate on uneven surface carrying 50 lbs in backpack to simulate [employer] training exercise.    Time 6   Period Weeks   Status New               Plan - 02/25/15 1657    Clinical Impression Statement Continued with single leg strengthening and  plyometric training today. Pt demonstrated good quad strength and control during single leg squat on BOSU, was able to  maintain position with no LOB during pertubations. She was able to complete increased reps of Nordic hamstring exercise with less verbal cueing required from PT for form. Pt continues to require verbal cueing during hopping activities for proper landing and form, but is demonstrating good tolerance to the activity.    PT Next Visit Plan Progress single leg hopping, continue with plyometric and eccentric strengthening        Problem List There are no active problems to display for this patient.   Leona Singleton, PT, DPT [phone] 02/25/2015, 5:01 PM  [facility] [address] [place], [place], [address] Phone: [phone]   Fax:  [phone]  Name: Shimika Ames MRN: [id number] Date of Birth: [birth year]

## 2015-03-30 ENCOUNTER — Ambulatory Visit (HOSPITAL_COMMUNITY): Payer: Federal, State, Local not specified - PPO | Attending: Orthopedic Surgery | Admitting: Physical Therapy

## 2015-03-30 DIAGNOSIS — R269 Unspecified abnormalities of gait and mobility: Secondary | ICD-10-CM

## 2015-03-30 DIAGNOSIS — M25661 Stiffness of right knee, not elsewhere classified: Secondary | ICD-10-CM | POA: Diagnosis present

## 2015-03-30 DIAGNOSIS — M25561 Pain in right knee: Secondary | ICD-10-CM | POA: Diagnosis not present

## 2015-03-30 DIAGNOSIS — R2681 Unsteadiness on feet: Secondary | ICD-10-CM | POA: Diagnosis present

## 2015-03-30 DIAGNOSIS — Z9889 Other specified postprocedural states: Secondary | ICD-10-CM

## 2015-03-30 DIAGNOSIS — R600 Localized edema: Secondary | ICD-10-CM | POA: Insufficient documentation

## 2015-03-30 DIAGNOSIS — R262 Difficulty in walking, not elsewhere classified: Secondary | ICD-10-CM | POA: Diagnosis present

## 2015-03-30 NOTE — Therapy (Signed)
[place], [place], [address] Phone: [phone]   Fax:  [phone]  Physical Therapy Treatment (Re-Assessment)  Patient Details  Name: Ariel Parker MRN: [id number] Date of Birth: 28-Aug-1990 Referring Provider: Dr. Percell Miller   Encounter Date: 03/30/2015      PT End of Session - 03/30/15 0831    Visit Number 45   Number of Visits 61   Date for PT Re-Evaluation 04/27/15   Authorization Type BCBS/ [employer] secondary (PROGRESS NOTE MUST BE DONE WEEKLY FOR MILITARY, give to patient each Friday)   Authorization Time Period 12/26/14-04/29/15   PT Start Time 0805   PT Stop Time 0844   PT Time Calculation (min) 39 min   Activity Tolerance Patient tolerated treatment well   Behavior During Therapy [facility] for tasks assessed/performed      Past Medical History  Diagnosis Date  . Disruption of anterior cruciate ligament of right knee 09/2014    Past Surgical History  Procedure Laterality Date  . Wisdom tooth extraction    . Knee arthroscopy w/ acl reconstruction Left 07/17/2006  . Knee arthroscopy with anterior cruciate ligament (acl) repair with hamstring graft Right 10/14/2014    Procedure: RIGHT KNEE ARTHROSCOPY WITH ANTERIOR CRUCIATE LIGAMENT (ACL) REPAIR;  Surgeon: Kathryne Hitch, MD;  Location: [place];  Service: Orthopedics;  Laterality: Right;  . Knee arthroscopy with lateral menisectomy Right 10/14/2014    Procedure: KNEE ARTHROSCOPY WITH LATERAL MENISECTOMY;  Surgeon: Kathryne Hitch, MD;  Location: [place];  Service: Orthopedics;  Laterality: Right;    There were no vitals filed for this visit.  Visit Diagnosis:  Right knee pain  Unsteadiness  Knee stiffness, right  Difficulty walking  Abnormality of gait  S/P ACL repair      Subjective Assessment - 03/30/15 0807    Subjective Patient returning to PT after being off for a month due to insurance; she reports she has been  doing workouts on her own trying not to fall backwards progress wise outside of PT    Pertinent History Patient was playing soccer, jumped up and landed and heard a pop. ACL surgery was the 21st of July. Things at home have been going OK but at one point MD thought there might have been a blood clot, which did come back negative. Was taken off of CPM due to blood clot concern and to her knowledge is not going to be put back on CPM.    Currently in Pain? No/denies            [facility] PT Assessment - 03/30/15 0001    Assessment   Medical Diagnosis s/p Rt ACL repair    Referring Provider Dr. Percell Miller    Onset Date/Surgical Date 10/14/14   Next MD Visit February 7th or 8th with Dr. Percell Miller    Precautions   Precautions None   Restrictions   Weight Bearing Restrictions No   Balance Screen   Has the patient fallen in the past 6 months No   Has the patient had a decrease in activity level because of a fear of falling?  No   Is the patient reluctant to leave their home because of a fear of falling?  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    Observation/Other Assessments   Focus  on Therapeutic Outcomes (FOTO)  25% limited    AROM   Right Knee Extension 0   Right Knee Flexion 126   Strength   Right Hip Flexion 5/5   Right Hip Extension 5/5   Right Hip ABduction 5/5   Left Hip Flexion 5/5   Left Hip Extension 5/5   Left Hip ABduction 5/5   Right Knee Flexion 4+/5   Right Knee Extension 5/5   Left Knee Flexion 5/5   Left Knee Extension 5/5                     [facility] Adult PT Treatment/Exercise - 03/30/15 0001    Knee/Hip Exercises: Standing   Functional Squat 15 reps;2 sets   Functional Squat Limitations on BOSU, blue side up    Other Standing Knee Exercises single leg squat  holds 10 seconds each, 2 sets of 10    Knee/Hip Exercises: Seated   Other Seated Knee/Hip Exercises nordic hamstrings and quads 1x10 each                 PT Education - 03/30/15 0830    Education provided Yes   Education Details progress thus far, plan of care moving forward    Person(s) Educated Patient   Methods Explanation   Comprehension Verbalized understanding          PT Short Term Goals - 03/30/15 0815    PT SHORT TERM GOAL #1   Title Patient will demonstrate R knee ROM of 0-120 degrees with no pain   Baseline 1/4- 0 to 126   Time 3   Period Weeks   Status Achieved   PT SHORT TERM GOAL #2   Title Patient will demonstrate at least 4-/5 strength in R knee and at least 4+/5 strength in L LE    Time 3   Period Weeks   Status Achieved   PT SHORT TERM GOAL #3   Title Patient to be ambulating unlimited distances without crutches and WBAT R LE, equal step lengths, equal weight bearing each side, minimal unsteadiness, pain no more than 1/10   Time 3   Period Weeks   Status Achieved   PT SHORT TERM GOAL #4   Title Patient will experience 0/10 pain R knee  with all functional weight bearing tasks and exercises    Time 3   Period Weeks   Status Achieved   PT SHORT TERM GOAL #5   Title Patient to be independent in consistently and correctly performing  appropriate HEP, to be updated PRN    Time 3   Period Weeks   Status Achieved           PT Long Term Goals - 03/30/15 3151    PT LONG TERM GOAL #1   Title Patient to have 0 degrees extension  and 130 degrees flexion R knee, pain 0/10    Baseline 1/4- 0 to 126   Time 6   Period Weeks   Status Partially Met   PT LONG TERM GOAL #2   Title Patient will be able to perform single leg squat to floor with R knee, minimal knee valgus, and pain 0/10   Baseline 1/4- about halfway down, reduced muscle control during sls squat    Time 6   Period Weeks   Status On-going   PT LONG TERM GOAL #3   Title Patient will be  able to perform single leg hops of equal distance with bilateral lower extremities  and pain 0/10 R knee    Baseline 1/4- L 41, R 33.5 inches    Time 6   Period Weeks   Status On-going   PT LONG TERM GOAL #4   Title Patient to be able to jump off of at least 14 inch box and land on bilateral feet with good technique, good knee positioning with minimal knee valgus, and pain R knee 0/10   Time 6   Period Weeks   Status Achieved   PT LONG TERM GOAL #5   Title Patient to be able to maintain single leg stance on BOSU for at least 30 seconds with mild to moderate perturbations, pain R knee 0/10   Baseline 1/4- able to maintain SLS on BOSU without perturbations   Time 6   Period Weeks   Status On-going   PT LONG TERM GOAL #6   Title Patient will demonstrate the ability to safely ascend and descend full height ladder and crawl around obstacles with pain R knee 0/10   Baseline 1/4- not tested    Time 6   Period Weeks   Status On-going   PT LONG TERM GOAL #7   Title Pt will ambulate on uneven surface carrying 50 lbs in backpack to simulate [employer] training exercise.    Time 6   Period Weeks   Status On-going               Plan - 03/30/15 4827    Clinical Impression Statement Re-assessment performed today. Patient demonstrates good ROM and isolated strength however does continue to demonstrate some difficulty with dynamic tasks, especially single leg squats, hops, and perturbations on BOSU when on single leg, which will continue to be addressed by skilled PT services  during her ongoing course of care. Noted some ongoing reduced muscle control and some valgus tendency with single leg tasks still although this is definitely improved from baseline. From here on skilled PT services with focus primarily on dynamic functional tasks especially in SLS, climbing/jumping tasks, and dynamic mobilty with weighted backpack in order to assist patient in returning to optimal level of function and  facilitate return to [employer] training camp.    Pt will benefit from skilled therapeutic intervention in order to improve on the following deficits Abnormal gait;Decreased coordination;Decreased range of motion;Difficulty walking;Impaired tone;Decreased safety awareness;Decreased activity tolerance;Pain;Decreased balance;Impaired flexibility;Improper body mechanics;Decreased mobility;Decreased strength;Increased edema;Postural dysfunction   Rehab Potential Excellent   PT Frequency 2x / week   PT Duration 4 weeks   PT Treatment/Interventions ADLs/Self Care Home Management;Cryotherapy;Electrical Stimulation;DME Instruction;Gait training;Stair training;Functional mobility training;Therapeutic activities;Therapeutic exercise;Balance training;Neuromuscular re-education;Patient/family education;Manual techniques;Passive range of motion   PT Next Visit Plan Progress single leg motor control, continue with plyometric and eccentric strengthening; work on exercise and mobility with weighted backpack on    PT Home Exercise Plan No changes this session    Consulted and Agree with Plan of Care Patient        Problem List There are no active problems to display for this patient.   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

## 2015-04-01 ENCOUNTER — Ambulatory Visit (HOSPITAL_COMMUNITY): Payer: Federal, State, Local not specified - PPO | Admitting: Physical Therapy

## 2015-04-01 DIAGNOSIS — R600 Localized edema: Secondary | ICD-10-CM

## 2015-04-01 DIAGNOSIS — R2681 Unsteadiness on feet: Secondary | ICD-10-CM

## 2015-04-01 DIAGNOSIS — R269 Unspecified abnormalities of gait and mobility: Secondary | ICD-10-CM

## 2015-04-01 DIAGNOSIS — M25561 Pain in right knee: Secondary | ICD-10-CM | POA: Diagnosis not present

## 2015-04-01 DIAGNOSIS — R262 Difficulty in walking, not elsewhere classified: Secondary | ICD-10-CM

## 2015-04-01 DIAGNOSIS — Z9889 Other specified postprocedural states: Secondary | ICD-10-CM

## 2015-04-01 DIAGNOSIS — M25661 Stiffness of right knee, not elsewhere classified: Secondary | ICD-10-CM

## 2015-04-01 NOTE — Therapy (Signed)
[place], [place], [address] Phone: [phone]   Fax:  [phone]  Physical Therapy Treatment  Patient Details  Name: Ariel Parker MRN: [id number] Date of Birth: 1990/04/07 Referring Provider: Dr. Percell Miller   Encounter Date: 04/01/2015      PT End of Session - 04/01/15 1615    Visit Number 54   Number of Visits 24   Date for PT Re-Evaluation 04/27/15   Authorization Type BCBS/ T[employer] secondary (PROGRESS NOTE MUST BE DONE WEEKLY FOR MILITARY, give to patient each Friday)   Authorization Time Period 12/26/14-04/29/15   PT Start Time 1350   PT Stop Time 1432   PT Time Calculation (min) 42 min   Activity Tolerance Patient tolerated treatment well      Past Medical History  Diagnosis Date  . Disruption of anterior cruciate ligament of right knee 09/2014    Past Surgical History  Procedure Laterality Date  . Wisdom tooth extraction    . Knee arthroscopy w/ acl reconstruction Left 07/17/2006  . Knee arthroscopy with anterior cruciate ligament (acl) repair with hamstring graft Right 10/14/2014    Procedure: RIGHT KNEE ARTHROSCOPY WITH ANTERIOR CRUCIATE LIGAMENT (ACL) REPAIR;  Surgeon: Kathryne Hitch, MD;  Location: [place];  Service: Orthopedics;  Laterality: Right;  . Knee arthroscopy with lateral menisectomy Right 10/14/2014    Procedure: KNEE ARTHROSCOPY WITH LATERAL MENISECTOMY;  Surgeon: Kathryne Hitch, MD;  Location: [place];  Service: Orthopedics;  Laterality: Right;    There were no vitals filed for this visit.  Visit Diagnosis:  Right knee pain  Unsteadiness  Knee stiffness, right  Difficulty walking  Abnormality of gait  S/P ACL repair  Localized edema      Subjective Assessment - 04/01/15 1351    Subjective Pt states that she is doing well    Pertinent History Patient was playing soccer, jumped up and landed and heard a pop. ACL surgery was the 21st of July.  Things at home have been going OK but at one point MD thought there might have been a blood clot, which did come back negative. Was taken off of CPM due to blood clot concern and to her knowledge is not going to be put back on CPM.                          [facility] Adult PT Treatment/Exercise - 04/01/15 0001    Balance Poses: Yoga   Warrior III 3 reps;60 seconds;Time  Rt only    Knee/Hip Exercises: [employer] operational officer Right;Limitations   Active Hamstring Stretch Limitations long sit x 2'    Knee/Hip Exercises: Machines for Strengthening   Cybex Knee Extension 3.5pl Rt 10 reps x 2    Other Machine 1 max rep hamstrings are 86.4% of Lt; quadricep is 64.5 % of Lt with 1 max rep hamstring 57.5; quadricep 45#    Knee/Hip Exercises: Standing   Heel Raises 20 reps   Functional Squat 10 reps  second set Rt only x 15.    Rocker Board 2 minutes   Knee/Hip Exercises: Seated                    PT Education - 04/01/15 1613    Education provided Yes   Education Details That her hamstrings is at 86.4% of her Lt LE with her quad being at 64.5%; Emphasis must be on increasing  quadricep strength. Optimally we would like quad to be at least 85% of the Lt quad.           PT Short Term Goals - 03/30/15 0815    PT SHORT TERM GOAL #1   Title Patient will demonstrate R knee ROM of 0-120 degrees with no pain   Baseline 1/4- 0 to 126   Time 3   Period Weeks   Status Achieved   PT SHORT TERM GOAL #2   Title Patient will demonstrate at least 4-/5 strength in R knee and at least 4+/5 strength in L LE    Time 3   Period Weeks   Status Achieved   PT SHORT TERM GOAL #3   Title Patient to be ambulating unlimited distances without crutches and WBAT R LE, equal step lengths, equal weight bearing each side, minimal unsteadiness, pain no more than 1/10   Time 3   Period Weeks   Status Achieved   PT SHORT TERM GOAL #4   Title Patient will experience 0/10 pain R knee   with all functional weight bearing tasks and exercises    Time 3   Period Weeks   Status Achieved   PT SHORT TERM GOAL #5   Title Patient to be independent in consistently and correctly performing  appropriate HEP, to be updated PRN    Time 3   Period Weeks   Status Achieved           PT Long Term Goals - 03/30/15 3662    PT LONG TERM GOAL #1   Title Patient to have 0 degrees extension  and 130 degrees flexion R knee, pain 0/10    Baseline 1/4- 0 to 126   Time 6   Period Weeks   Status Partially Met   PT LONG TERM GOAL #2   Title Patient will be able to perform single leg squat to floor with R knee, minimal knee valgus, and pain 0/10   Baseline 1/4- about halfway down, reduced muscle control during sls squat    Time 6   Period Weeks   Status On-going   PT LONG TERM GOAL #3   Title Patient will be able to perform single leg hops of equal distance with bilateral lower extremities and pain 0/10 R knee    Baseline 1/4- L 41, R 33.5 inches    Time 6   Period Weeks   Status On-going   PT LONG TERM GOAL #4   Title Patient to be able to jump off of at least 14 inch box and land on bilateral feet with good technique, good knee positioning with minimal knee valgus, and pain R knee 0/10   Time 6   Period Weeks   Status Achieved   PT LONG TERM GOAL #5   Title Patient to be able to maintain single leg stance on BOSU for at least 30 seconds with mild to moderate perturbations, pain R knee 0/10   Baseline 1/4- able to maintain SLS on BOSU without perturbations   Time 6   Period Weeks   Status On-going   PT LONG TERM GOAL #6   Title Patient will demonstrate the ability to safely ascend and descend full height ladder and crawl around obstacles with pain R knee 0/10   Baseline 1/4- not tested    Time 6   Period Weeks   Status On-going   PT LONG TERM GOAL #7   Title Pt will ambulate on uneven surface carrying  50 lbs in backpack to simulate [employer] training exercise.    Time 6    Period Weeks   Status On-going               Plan - 04/01/15 1616    Clinical Impression Statement Tested 1 max quad,(45#) and hamstring ,(57.5#) with hamstring at 86.4% of Lt but quadricep is only at 64.5% of left.  Pt hamstring should be 80% of quadricep but at this point is at 123%.  Pt encouraged to keep up quad strengthening at home.    PT Next Visit Plan Progress single leg motor control, continue with plyometric and eccentric strengthening; work on exercise and mobility with weighted backpack on;Emphasis must be on increasing quadricep strength. Optimally we would like quad to be at least 85% of the Lt quad        Problem List There are no active problems to display for this patient.  Rayetta Humphrey, PT CLT [phone] 04/01/2015, 4:21 PM  [place] [address] [place], [place], [address] Phone: [phone]   Fax:  [phone]  Name: Ariel Parker MRN: [id number] Date of Birth: [birth year]

## 2015-04-04 ENCOUNTER — Ambulatory Visit (HOSPITAL_COMMUNITY): Payer: Federal, State, Local not specified - PPO | Admitting: Physical Therapy

## 2015-04-04 DIAGNOSIS — M25561 Pain in right knee: Secondary | ICD-10-CM | POA: Diagnosis not present

## 2015-04-04 DIAGNOSIS — M25661 Stiffness of right knee, not elsewhere classified: Secondary | ICD-10-CM

## 2015-04-04 DIAGNOSIS — Z9889 Other specified postprocedural states: Secondary | ICD-10-CM

## 2015-04-04 DIAGNOSIS — R2681 Unsteadiness on feet: Secondary | ICD-10-CM

## 2015-04-04 DIAGNOSIS — R262 Difficulty in walking, not elsewhere classified: Secondary | ICD-10-CM

## 2015-04-04 DIAGNOSIS — R269 Unspecified abnormalities of gait and mobility: Secondary | ICD-10-CM

## 2015-04-04 NOTE — Therapy (Signed)
[facility] [address] [place], [place], [address] Phone: [phone]   Fax:  [phone]  Physical Therapy Treatment  Patient Details  Name: Ariel Parker MRN: [id number] Date of Birth: 05/09/1990 Referring Provider: Dr. Murphy   Encounter Date: 04/04/2015      PT End of Session - 04/04/15 1058    Visit Number 47   Number of Visits 61   Date for PT Re-Evaluation 04/27/15   Authorization Type BCBS/ [employer] secondary (PROGRESS NOTE MUST BE DONE WEEKLY FOR MILITARY, give to patient each Friday)   Authorization Time Period 12/26/14-04/29/15   PT Start Time 0930   PT Stop Time 1012   PT Time Calculation (min) 42 min   Activity Tolerance Patient tolerated treatment well   Behavior During Therapy WFL for tasks assessed/performed      Past Medical History  Diagnosis Date  . Disruption of anterior cruciate ligament of right knee 09/2014    Past Surgical History  Procedure Laterality Date  . Wisdom tooth extraction    . Knee arthroscopy w/ acl reconstruction Left 07/17/2006  . Knee arthroscopy with anterior cruciate ligament (acl) repair with hamstring graft Right 10/14/2014    Procedure: RIGHT KNEE ARTHROSCOPY WITH ANTERIOR CRUCIATE LIGAMENT (ACL) REPAIR;  Surgeon: Daniel Murphy, MD;  Location: [facility];  Service: Orthopedics;  Laterality: Right;  . Knee arthroscopy with lateral menisectomy Right 10/14/2014    Procedure: KNEE ARTHROSCOPY WITH LATERAL MENISECTOMY;  Surgeon: Daniel Murphy, MD;  Location: [facility];  Service: Orthopedics;  Laterality: Right;    There were no vitals filed for this visit.  Visit Diagnosis:  Right knee pain  Unsteadiness  Knee stiffness, right  Difficulty walking  Abnormality of gait  S/P ACL repair      Subjective Assessment - 04/04/15 0935    Subjective Patient doing well today, no pain but reports taht knee just feels weird during dynamic activiities. Came with  backpack weighing 28.6 pounds per clinic scale.    Pertinent History Patient was playing soccer, jumped up and landed and heard a pop. ACL surgery was the 21st of July. Things at home have been going OK but at one point MD thought there might have been a blood clot, which did come back negative. Was taken off of CPM due to blood clot concern and to her knowledge is not going to be put back on CPM.    Currently in Pain? No/denies                         [facility] Adult PT Treatment/Exercise - 04/04/15 0001    Knee/Hip Exercises: Aerobic   Other Aerobic 3 laps speed walking, 3 laps jog for warmup; gait with weighted back 4 laps    Knee/Hip Exercises: Standing   Heel Raises Both;1 set;15 reps   Heel Raises Limitations weighted pack    Forward Lunges Both;1 set;15 reps   Forward Lunges Limitations weighted pack    Forward Step Up Both;1 set;15 reps   Forward Step Up Limitations weighted pack    Functional Squat 15 reps;2 sets   Functional Squat Limitations on BOSU blue side up    Rocker Board 2 minutes   Rocker Board Limitations AP and lateral    Other Standing Knee Exercises front goblet squats with 10# 2x10    Other Standing Knee Exercises sit to stand, 1x10 with pack within limits of good form    Knee/Hip Exercises:   Seated   Other Seated Knee/Hip Exercises nordic hams/quads 1x10 each                 PT Education - 04/04/15 1058    Education provided No          PT Short Term Goals - 03/30/15 0815    PT SHORT TERM GOAL #1   Title Patient will demonstrate R knee ROM of 0-120 degrees with no pain   Baseline 1/4- 0 to 126   Time 3   Period Weeks   Status Achieved   PT SHORT TERM GOAL #2   Title Patient will demonstrate at least 4-/5 strength in R knee and at least 4+/5 strength in L LE    Time 3   Period Weeks   Status Achieved   PT SHORT TERM GOAL #3   Title Patient to be ambulating unlimited distances without crutches and WBAT R LE, equal step lengths,  equal weight bearing each side, minimal unsteadiness, pain no more than 1/10   Time 3   Period Weeks   Status Achieved   PT SHORT TERM GOAL #4   Title Patient will experience 0/10 pain R knee  with all functional weight bearing tasks and exercises    Time 3   Period Weeks   Status Achieved   PT SHORT TERM GOAL #5   Title Patient to be independent in consistently and correctly performing  appropriate HEP, to be updated PRN    Time 3   Period Weeks   Status Achieved           PT Long Term Goals - 03/30/15 0816    PT LONG TERM GOAL #1   Title Patient to have 0 degrees extension  and 130 degrees flexion R knee, pain 0/10    Baseline 1/4- 0 to 126   Time 6   Period Weeks   Status Partially Met   PT LONG TERM GOAL #2   Title Patient will be able to perform single leg squat to floor with R knee, minimal knee valgus, and pain 0/10   Baseline 1/4- about halfway down, reduced muscle control during sls squat    Time 6   Period Weeks   Status On-going   PT LONG TERM GOAL #3   Title Patient will be able to perform single leg hops of equal distance with bilateral lower extremities and pain 0/10 R knee    Baseline 1/4- L 41, R 33.5 inches    Time 6   Period Weeks   Status On-going   PT LONG TERM GOAL #4   Title Patient to be able to jump off of at least 14 inch box and land on bilateral feet with good technique, good knee positioning with minimal knee valgus, and pain R knee 0/10   Time 6   Period Weeks   Status Achieved   PT LONG TERM GOAL #5   Title Patient to be able to maintain single leg stance on BOSU for at least 30 seconds with mild to moderate perturbations, pain R knee 0/10   Baseline 1/4- able to maintain SLS on BOSU without perturbations   Time 6   Period Weeks   Status On-going   PT LONG TERM GOAL #6   Title Patient will demonstrate the ability to safely ascend and descend full height ladder and crawl around obstacles with pain R knee 0/10   Baseline 1/4- not tested     Time 6   Period   Weeks   Status On-going   PT LONG TERM GOAL #7   Title Pt will ambulate on uneven surface carrying 50 lbs in backpack to simulate [employer] training exercise.    Time 6   Period Weeks   Status On-going               Plan - 04/04/15 1005    Clinical Impression Statement Continued funcitonal exercises today based on improving hamstring and quadriceps functional strength, also introduced exercises with weighted military backpack with 28.6 pounds in it today. Difficulty with form with quad control of valgus moment  with dynamic exercises today especiially with pack on at this point. Quad weakness still playing a major role in function as patient does demonstrate significant fatigue and variabilities in form due to this. Encouraged to continue with quad strength at home.    Pt will benefit from skilled therapeutic intervention in order to improve on the following deficits Abnormal gait;Decreased coordination;Decreased range of motion;Difficulty walking;Impaired tone;Decreased safety awareness;Decreased activity tolerance;Pain;Decreased balance;Impaired flexibility;Improper body mechanics;Decreased mobility;Decreased strength;Increased edema;Postural dysfunction   Rehab Potential Excellent   PT Frequency 2x / week   PT Duration 4 weeks   PT Treatment/Interventions ADLs/Self Care Home Management;Cryotherapy;Electrical Stimulation;DME Instruction;Gait training;Stair training;Functional mobility training;Therapeutic activities;Therapeutic exercise;Balance training;Neuromuscular re-education;Patient/family education;Manual techniques;Passive range of motion   PT Next Visit Plan Progress single leg motor control, continue with plyometric and eccentric strengthening; work on exercise and mobility with weighted backpack on;Emphasis must be on increasing quadricep strength. Optimally we would like quad to be at least 85% of the Lt quad   PT Home Exercise Plan No changes this session     Consulted and Agree with Plan of Care Patient        Problem List There are no active problems to display for this patient.   Deniece Ree PT, DPT [phone]  [place] [address] [place], [place], [address] Phone: [phone]   Fax:  [phone]  Name: Ariel Parker MRN: [id number] Date of Birth: 04-Apr-1990

## 2015-04-06 ENCOUNTER — Ambulatory Visit (HOSPITAL_COMMUNITY): Payer: Federal, State, Local not specified - PPO | Admitting: Physical Therapy

## 2015-04-08 ENCOUNTER — Telehealth (HOSPITAL_COMMUNITY): Payer: Self-pay | Admitting: Physical Therapy

## 2015-04-08 ENCOUNTER — Ambulatory Visit (HOSPITAL_COMMUNITY): Payer: Federal, State, Local not specified - PPO | Admitting: Physical Therapy

## 2015-04-08 NOTE — Telephone Encounter (Signed)
She is sick and can not come in today °

## 2015-04-12 ENCOUNTER — Ambulatory Visit (HOSPITAL_COMMUNITY): Payer: Federal, State, Local not specified - PPO

## 2015-04-12 DIAGNOSIS — Z9889 Other specified postprocedural states: Secondary | ICD-10-CM

## 2015-04-12 DIAGNOSIS — M25561 Pain in right knee: Secondary | ICD-10-CM

## 2015-04-12 DIAGNOSIS — R269 Unspecified abnormalities of gait and mobility: Secondary | ICD-10-CM

## 2015-04-12 DIAGNOSIS — R262 Difficulty in walking, not elsewhere classified: Secondary | ICD-10-CM

## 2015-04-12 DIAGNOSIS — R2681 Unsteadiness on feet: Secondary | ICD-10-CM

## 2015-04-12 DIAGNOSIS — M25661 Stiffness of right knee, not elsewhere classified: Secondary | ICD-10-CM

## 2015-04-12 DIAGNOSIS — R600 Localized edema: Secondary | ICD-10-CM

## 2015-04-12 NOTE — Therapy (Signed)
[place], [place], [address] Phone: [phone]   Fax:  [phone]  Physical Therapy Treatment  Patient Details  Name: Ariel Parker MRN: [id number] Date of Birth: 1990/11/15 Referring Provider: Dr. Maretta Los  Encounter Date: 04/12/2015      PT End of Session - 04/12/15 0812    Visit Number 33   Number of Visits 71   Date for PT Re-Evaluation 04/27/15   Authorization Type BCBS/ [employer] secondary (PROGRESS NOTE MUST BE DONE WEEKLY FOR MILITARY, give to patient each Friday)   Authorization Time Period 12/26/14-04/29/15   PT Start Time 0803   PT Stop Time 0846   PT Time Calculation (min) 43 min   Activity Tolerance Patient tolerated treatment well   Behavior During Therapy [facility] for tasks assessed/performed      Past Medical History  Diagnosis Date  . Disruption of anterior cruciate ligament of right knee 09/2014    Past Surgical History  Procedure Laterality Date  . Wisdom tooth extraction    . Knee arthroscopy w/ acl reconstruction Left 07/17/2006  . Knee arthroscopy with anterior cruciate ligament (acl) repair with hamstring graft Right 10/14/2014    Procedure: RIGHT KNEE ARTHROSCOPY WITH ANTERIOR CRUCIATE LIGAMENT (ACL) REPAIR;  Surgeon: Kathryne Hitch, MD;  Location: [place];  Service: Orthopedics;  Laterality: Right;  . Knee arthroscopy with lateral menisectomy Right 10/14/2014    Procedure: KNEE ARTHROSCOPY WITH LATERAL MENISECTOMY;  Surgeon: Kathryne Hitch, MD;  Location: [place];  Service: Orthopedics;  Laterality: Right;    There were no vitals filed for this visit.  Visit Diagnosis:  Right knee pain  Unsteadiness  Knee stiffness, right  Difficulty walking  Abnormality of gait  S/P ACL repair  Localized edema      Subjective Assessment - 04/12/15 0811    Subjective Pt stated she is doing good today, no reports of pain today.   Pertinent History Patient was  playing soccer, jumped up and landed and heard a pop. ACL surgery was the 21st of July. Things at home have been going OK but at one point MD thought there might have been a blood clot, which did come back negative. Was taken off of CPM due to blood clot concern and to her knowledge is not going to be put back on CPM.    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 No/denies            [facility] PT Assessment - 04/12/15 0001    Assessment   Medical Diagnosis s/p Rt ACL repair    Referring Provider Dr. Maretta Los   Onset Date/Surgical Date 10/14/14   Next MD Visit February 3rd with Dr. Karn Pickler Adult PT Treatment/Exercise - 04/12/15 0001    Knee/Hip Exercises: Aerobic   Other Aerobic 3 laps speed walking, 3 laps jog for warmup; gait with weighted back 4 laps    Knee/Hip Exercises: Standing   Heel Raises Both;1 set;15 reps   Heel Raises Limitations weighted pack    Forward Lunges Both;1 set;15 reps   Forward Lunges Limitations weighted pack    Lateral Step Up Right;15 reps;Hand Hold: 1;Step Height: 6"   Lateral Step Up Limitations weighted pack   Forward Step Up Both;1 set;15 reps;Step Height: 8"   Forward Step Up Limitations weighted pack  Step Down 10 reps   Step Down Limitations single leg balance reach   Functional Squat 20 reps  2 sets: 2nd set Rt LE only   Knee/Hip Exercises: Seated   Other Seated Knee/Hip Exercises nordic hams/quads 1x10 each            PT Short Term Goals - 03/30/15 0815    PT SHORT TERM GOAL #1   Title Patient will demonstrate R knee ROM of 0-120 degrees with no pain   Baseline 1/4- 0 to 126   Time 3   Period Weeks   Status Achieved   PT SHORT TERM GOAL #2   Title Patient will demonstrate at least 4-/5 strength in R knee and at least 4+/5 strength in L LE    Time 3   Period Weeks   Status Achieved   PT SHORT TERM GOAL #3   Title  Patient to be ambulating unlimited distances without crutches and WBAT R LE, equal step lengths, equal weight bearing each side, minimal unsteadiness, pain no more than 1/10   Time 3   Period Weeks   Status Achieved   PT SHORT TERM GOAL #4   Title Patient will experience 0/10 pain R knee  with all functional weight bearing tasks and exercises    Time 3   Period Weeks   Status Achieved   PT SHORT TERM GOAL #5   Title Patient to be independent in consistently and correctly performing  appropriate HEP, to be updated PRN    Time 3   Period Weeks   Status Achieved           PT Long Term Goals - 03/30/15 0141    PT LONG TERM GOAL #1   Title Patient to have 0 degrees extension  and 130 degrees flexion R knee, pain 0/10    Baseline 1/4- 0 to 126   Time 6   Period Weeks   Status Partially Met   PT LONG TERM GOAL #2   Title Patient will be able to perform single leg squat to floor with R knee, minimal knee valgus, and pain 0/10   Baseline 1/4- about halfway down, reduced muscle control during sls squat    Time 6   Period Weeks   Status On-going   PT LONG TERM GOAL #3   Title Patient will be able to perform single leg hops of equal distance with bilateral lower extremities and pain 0/10 R knee    Baseline 1/4- L 41, R 33.5 inches    Time 6   Period Weeks   Status On-going   PT LONG TERM GOAL #4   Title Patient to be able to jump off of at least 14 inch box and land on bilateral feet with good technique, good knee positioning with minimal knee valgus, and pain R knee 0/10   Time 6   Period Weeks   Status Achieved   PT LONG TERM GOAL #5   Title Patient to be able to maintain single leg stance on BOSU for at least 30 seconds with mild to moderate perturbations, pain R knee 0/10   Baseline 1/4- able to maintain SLS on BOSU without perturbations   Time 6   Period Weeks   Status On-going   PT LONG TERM GOAL #6   Title Patient will demonstrate the ability to safely ascend and descend  full height ladder and crawl around obstacles with pain R knee 0/10   Baseline 1/4- not tested  Time 6   Period Weeks   Status On-going   PT LONG TERM GOAL #7   Title Pt will ambulate on uneven surface carrying 50 lbs in backpack to simulate [employer] training exercise.    Time 6   Period Weeks   Status On-going               Plan - 04/12/15 0840    Clinical Impression Statement Session focus on progressiong quadriceps functional strengthening, majority of exercises complete with weighted military backpack (28.6 punds).  Added single leg balance reach for eccentric control strengthening.  Pt exhibited significant muscle fatigue with single leg activities due to weakness.  No reports of pain through session.   PT Next Visit Plan Progress single leg motor control, continue with plyometric and eccentric strengthening; work on exercise and mobility with weighted backpack on;Emphasis must be on increasing quadricep strength. Optimally we would like quad to be at least 85% of the Lt quad        Problem List There are no active problems to display for this patient.  [address], LPTA; CBIS [phone]  Aldona Lento 04/12/2015, 8:56 AM  [place], [place], [address] Phone: [phone]   Fax:  [phone]  Name: Toniya Rozar MRN: [id number] Date of Birth: 1990/06/20

## 2015-04-14 ENCOUNTER — Encounter (HOSPITAL_COMMUNITY): Admitting: Physical Therapy

## 2015-04-19 ENCOUNTER — Ambulatory Visit (HOSPITAL_COMMUNITY): Payer: Federal, State, Local not specified - PPO | Admitting: Physical Therapy

## 2015-04-19 ENCOUNTER — Encounter (HOSPITAL_COMMUNITY)

## 2015-04-19 DIAGNOSIS — R269 Unspecified abnormalities of gait and mobility: Secondary | ICD-10-CM

## 2015-04-19 DIAGNOSIS — M25661 Stiffness of right knee, not elsewhere classified: Secondary | ICD-10-CM

## 2015-04-19 DIAGNOSIS — Z9889 Other specified postprocedural states: Secondary | ICD-10-CM

## 2015-04-19 DIAGNOSIS — M25561 Pain in right knee: Secondary | ICD-10-CM | POA: Diagnosis not present

## 2015-04-19 DIAGNOSIS — R262 Difficulty in walking, not elsewhere classified: Secondary | ICD-10-CM

## 2015-04-19 DIAGNOSIS — R2681 Unsteadiness on feet: Secondary | ICD-10-CM

## 2015-04-19 NOTE — Therapy (Signed)
[place], [place], [address] Phone: [phone]   Fax:  [phone]  Physical Therapy Treatment  Patient Details  Name: Ariel Parker MRN: [id number] Date of Birth: 06/16/1990 Referring Provider: Dr. Maretta Los  Encounter Date: 04/19/2015      PT End of Session - 04/19/15 1054    Visit Number 38   Number of Visits 92   Date for PT Re-Evaluation 04/27/15   Authorization Type BCBS/ [employer] secondary (PROGRESS NOTE MUST BE DONE WEEKLY FOR MILITARY, give to patient each Friday)   Authorization Time Period 12/26/14-04/29/15   PT Start Time 1017   PT Stop Time 1103   PT Time Calculation (min) 46 min   Activity Tolerance Patient tolerated treatment well      Past Medical History  Diagnosis Date  . Disruption of anterior cruciate ligament of right knee 09/2014    Past Surgical History  Procedure Laterality Date  . Wisdom tooth extraction    . Knee arthroscopy w/ acl reconstruction Left 07/17/2006  . Knee arthroscopy with anterior cruciate ligament (acl) repair with hamstring graft Right 10/14/2014    Procedure: RIGHT KNEE ARTHROSCOPY WITH ANTERIOR CRUCIATE LIGAMENT (ACL) REPAIR;  Surgeon: Kathryne Hitch, MD;  Location: [place];  Service: Orthopedics;  Laterality: Right;  . Knee arthroscopy with lateral menisectomy Right 10/14/2014    Procedure: KNEE ARTHROSCOPY WITH LATERAL MENISECTOMY;  Surgeon: Kathryne Hitch, MD;  Location: [place];  Service: Orthopedics;  Laterality: Right;    There were no vitals filed for this visit.  Visit Diagnosis:  Unsteadiness  Knee stiffness, right  Difficulty walking  Abnormality of gait  S/P ACL repair      Subjective Assessment - 04/19/15 1017    Subjective Pt forgot her backpack or todays treatment.    Currently in Pain? No/denies                         [facility] Adult PT Treatment/Exercise - 04/19/15 0001    Balance Poses:  Yoga   Warrior III 3 reps;60 seconds   Knee/Hip Exercises: Stretches   Sports administrator Right;3 reps;30 seconds   Futures trader Both;2 reps;30 seconds   Gastroc Stretch Limitations slant board    Knee/Hip Exercises: Aerobic   Elliptical 10:00   Knee/Hip Exercises: Standing   Heel Raises Right;15 reps   Functional Squat 10 reps   Functional Squat Limitations Rt only with 3# in B UE    Other Standing Knee Exercises ambulate 500' with B sports cord and therapist giving resistance.    Knee/Hip Exercises: Seated   Sit to Sand 15 reps  Rt LE only                 PT Education - 04/19/15 1053    Education provided Yes   Education Details The importance of resting/stretching when there is noted fatigue in her quadriceps.    Person(s) Educated Patient   Methods Explanation   Comprehension Verbalized understanding          PT Short Term Goals - 03/30/15 0815    PT SHORT TERM GOAL #1   Title Patient will demonstrate R knee ROM of 0-120 degrees with no pain   Baseline 1/4- 0 to 126   Time 3   Period Weeks   Status Achieved   PT SHORT TERM GOAL #2   Title Patient will demonstrate at least 4-/5  strength in R knee and at least 4+/5 strength in L LE    Time 3   Period Weeks   Status Achieved   PT SHORT TERM GOAL #3   Title Patient to be ambulating unlimited distances without crutches and WBAT R LE, equal step lengths, equal weight bearing each side, minimal unsteadiness, pain no more than 1/10   Time 3   Period Weeks   Status Achieved   PT SHORT TERM GOAL #4   Title Patient will experience 0/10 pain R knee  with all functional weight bearing tasks and exercises    Time 3   Period Weeks   Status Achieved   PT SHORT TERM GOAL #5   Title Patient to be independent in consistently and correctly performing  appropriate HEP, to be updated PRN    Time 3   Period Weeks   Status Achieved           PT Long Term Goals - 03/30/15 4696    PT  LONG TERM GOAL #1   Title Patient to have 0 degrees extension  and 130 degrees flexion R knee, pain 0/10    Baseline 1/4- 0 to 126   Time 6   Period Weeks   Status Partially Met   PT LONG TERM GOAL #2   Title Patient will be able to perform single leg squat to floor with R knee, minimal knee valgus, and pain 0/10   Baseline 1/4- about halfway down, reduced muscle control during sls squat    Time 6   Period Weeks   Status On-going   PT LONG TERM GOAL #3   Title Patient will be able to perform single leg hops of equal distance with bilateral lower extremities and pain 0/10 R knee    Baseline 1/4- L 41, R 33.5 inches    Time 6   Period Weeks   Status On-going   PT LONG TERM GOAL #4   Title Patient to be able to jump off of at least 14 inch box and land on bilateral feet with good technique, good knee positioning with minimal knee valgus, and pain R knee 0/10   Time 6   Period Weeks   Status Achieved   PT LONG TERM GOAL #5   Title Patient to be able to maintain single leg stance on BOSU for at least 30 seconds with mild to moderate perturbations, pain R knee 0/10   Baseline 1/4- able to maintain SLS on BOSU without perturbations   Time 6   Period Weeks   Status On-going   PT LONG TERM GOAL #6   Title Patient will demonstrate the ability to safely ascend and descend full height ladder and crawl around obstacles with pain R knee 0/10   Baseline 1/4- not tested    Time 6   Period Weeks   Status On-going   PT LONG TERM GOAL #7   Title Pt will ambulate on uneven surface carrying 50 lbs in backpack to simulate [employer] training exercise.    Time 6   Period Weeks   Status On-going               Plan - 04/19/15 1055    Clinical Impression Statement Pt had noted fatigue of quadriceps with noted trembling of mm.  Pt needed manual cuing to keep core in neutral position throughout exercises.     PT Next Visit Plan Progress single leg motor control, continue with plyometric and  eccentric strengthening; work  on exercise and mobility with weighted backpack on;Emphasis must be on increasing quadricep strength. Optimally we would like quad to be at least 85% of the Lt quad.  Progress note to [place] every Friday.         Problem List There are no active problems to display for this patient.  Rayetta Humphrey, PT CLT [phone] 04/19/2015, 10:59 AM  [place], [place], [address] Phone: [phone]   Fax:  [phone]  Name: Mckinzey Entwistle MRN: [id number] Date of Birth: [birth year]

## 2015-04-21 ENCOUNTER — Encounter (HOSPITAL_COMMUNITY): Admitting: Physical Therapy

## 2015-04-21 ENCOUNTER — Ambulatory Visit (HOSPITAL_COMMUNITY): Payer: Federal, State, Local not specified - PPO

## 2015-04-21 DIAGNOSIS — M25561 Pain in right knee: Secondary | ICD-10-CM

## 2015-04-21 DIAGNOSIS — M25661 Stiffness of right knee, not elsewhere classified: Secondary | ICD-10-CM

## 2015-04-21 DIAGNOSIS — R269 Unspecified abnormalities of gait and mobility: Secondary | ICD-10-CM

## 2015-04-21 DIAGNOSIS — Z9889 Other specified postprocedural states: Secondary | ICD-10-CM

## 2015-04-21 DIAGNOSIS — R262 Difficulty in walking, not elsewhere classified: Secondary | ICD-10-CM

## 2015-04-21 DIAGNOSIS — R2681 Unsteadiness on feet: Secondary | ICD-10-CM

## 2015-04-21 DIAGNOSIS — R600 Localized edema: Secondary | ICD-10-CM

## 2015-04-21 NOTE — Therapy (Signed)
[place], [place], [address] Phone: [phone]   Fax:  [phone]  Physical Therapy Treatment  Patient Details  Name: Ariel Parker MRN: [id number] Date of Birth: [birth year] Referring Provider: Dr. Maretta Los  Encounter Date: 04/21/2015      PT End of Session - 04/21/15 0929    Visit Number 50   Number of Visits 61   Date for PT Re-Evaluation 04/27/15   Authorization Type BCBS/ T[employer] secondary (PROGRESS NOTE MUST BE DONE WEEKLY FOR MILITARY, give to patient each Friday)   Authorization Time Period 12/26/14-04/29/15   PT Start Time 0848   PT Stop Time 0938   PT Time Calculation (min) 50 min   Equipment Utilized During Treatment --  weighted pack during session for RTW activiites   Activity Tolerance Patient tolerated treatment well   Behavior During Therapy [facility] for tasks assessed/performed      Past Medical History  Diagnosis Date  . Disruption of anterior cruciate ligament of right knee 09/2014    Past Surgical History  Procedure Laterality Date  . Wisdom tooth extraction    . Knee arthroscopy w/ acl reconstruction Left 07/17/2006  . Knee arthroscopy with anterior cruciate ligament (acl) repair with hamstring graft Right 10/14/2014    Procedure: RIGHT KNEE ARTHROSCOPY WITH ANTERIOR CRUCIATE LIGAMENT (ACL) REPAIR;  Surgeon: Kathryne Hitch, MD;  Location: [place];  Service: Orthopedics;  Laterality: Right;  . Knee arthroscopy with lateral menisectomy Right 10/14/2014    Procedure: KNEE ARTHROSCOPY WITH LATERAL MENISECTOMY;  Surgeon: Kathryne Hitch, MD;  Location: [place];  Service: Orthopedics;  Laterality: Right;    There were no vitals filed for this visit.  Visit Diagnosis:  Unsteadiness  Knee stiffness, right  Difficulty walking  Abnormality of gait  S/P ACL repair  Right knee pain  Localized edema      Subjective Assessment - 04/21/15 0849    Subjective  Pt rememebered her backpack today, no reports of pain today   Pertinent History Patient was playing soccer, jumped up and landed and heard a pop. ACL surgery was the 21st of July. Things at home have been going OK but at one point MD thought there might have been a blood clot, which did come back negative. Was taken off of CPM due to blood clot concern and to her knowledge is not going to be put back on CPM.    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 No/denies             [facility] Adult PT Treatment/Exercise - 04/21/15 0001    Knee/Hip Exercises: Stretches   Active Hamstring Stretch Right;Limitations;3 reps;30 seconds   Quad Stretch Right;3 reps;30 seconds   Quad Stretch Limitations standing   Knee/Hip Exercises: Aerobic   Elliptical 10:00   Other Aerobic 4 laps jog warm up   Knee/Hip Exercises: Standing   Heel Raises Right;15 reps   Heel Raises Limitations weighted pack    Step Down 10 reps   Step Down Limitations single leg balance reach; weighted pack   Functional Squat 15 reps   Functional Squat Limitations Rt only with 3# in B UE    Lunge Walking - Round Trips 2RT on balance beam with weighted pack   Knee/Hip Exercises: Seated   Other Seated Knee/Hip Exercises nordic hams/quads 1x10 each  PT Short Term Goals - 03/30/15 0815    PT SHORT TERM GOAL #1   Title Patient will demonstrate R knee ROM of 0-120 degrees with no pain   Baseline 1/4- 0 to 126   Time 3   Period Weeks   Status Achieved   PT SHORT TERM GOAL #2   Title Patient will demonstrate at least 4-/5 strength in R knee and at least 4+/5 strength in L LE    Time 3   Period Weeks   Status Achieved   PT SHORT TERM GOAL #3   Title Patient to be ambulating unlimited distances without crutches and WBAT R LE, equal step lengths, equal weight bearing each side, minimal unsteadiness, pain no  more than 1/10   Time 3   Period Weeks   Status Achieved   PT SHORT TERM GOAL #4   Title Patient will experience 0/10 pain R knee  with all functional weight bearing tasks and exercises    Time 3   Period Weeks   Status Achieved   PT SHORT TERM GOAL #5   Title Patient to be independent in consistently and correctly performing  appropriate HEP, to be updated PRN    Time 3   Period Weeks   Status Achieved           PT Long Term Goals - 03/30/15 0093    PT LONG TERM GOAL #1   Title Patient to have 0 degrees extension  and 130 degrees flexion R knee, pain 0/10    Baseline 1/4- 0 to 126   Time 6   Period Weeks   Status Partially Met   PT LONG TERM GOAL #2   Title Patient will be able to perform single leg squat to floor with R knee, minimal knee valgus, and pain 0/10   Baseline 1/4- about halfway down, reduced muscle control during sls squat    Time 6   Period Weeks   Status On-going   PT LONG TERM GOAL #3   Title Patient will be able to perform single leg hops of equal distance with bilateral lower extremities and pain 0/10 R knee    Baseline 1/4- L 41, R 33.5 inches    Time 6   Period Weeks   Status On-going   PT LONG TERM GOAL #4   Title Patient to be able to jump off of at least 14 inch box and land on bilateral feet with good technique, good knee positioning with minimal knee valgus, and pain R knee 0/10   Time 6   Period Weeks   Status Achieved   PT LONG TERM GOAL #5   Title Patient to be able to maintain single leg stance on BOSU for at least 30 seconds with mild to moderate perturbations, pain R knee 0/10   Baseline 1/4- able to maintain SLS on BOSU without perturbations   Time 6   Period Weeks   Status On-going   PT LONG TERM GOAL #6   Title Patient will demonstrate the ability to safely ascend and descend full height ladder and crawl around obstacles with pain R knee 0/10   Baseline 1/4- not tested    Time 6   Period Weeks   Status On-going   PT LONG TERM  GOAL #7   Title Pt will ambulate on uneven surface carrying 50 lbs in backpack to simulate [employer] training exercise.    Time 6   Period Weeks   Status On-going  Plan - 04/21/15 0931    Clinical Impression Statement Whole session complete with weighted pack on back for RTW activtiies.  Session focus on single leg stance functional strengthening activities, noted significant musculature fatigue with SLS squats.  Therapist facilitatiion to initiate core stabilitization with warrior poses this session.  No reports of pain through session, was limited by fatigue.   PT Next Visit Plan Progress single leg motor control, continue with plyometric and eccentric strengthening; work on exercise and mobility with weighted backpack on;Emphasis must be on increasing quadricep strength. Optimally we would like quad to be at least 85% of the Lt quad.  Progress note to [place] every Friday.         Problem List There are no active problems to display for this patient.  [address] LPTA; [place]  Aldona Lento 04/21/2015, 10:44 AM  [place], [place], [address] Phone: [phone]   Fax:  [phone]  Name: Ariel Parker MRN: [id number] Date of Birth: Nov 23, 1990

## 2015-04-26 ENCOUNTER — Encounter (HOSPITAL_COMMUNITY): Admitting: Physical Therapy

## 2015-04-26 ENCOUNTER — Ambulatory Visit (HOSPITAL_COMMUNITY): Payer: Federal, State, Local not specified - PPO | Admitting: Physical Therapy

## 2015-04-26 DIAGNOSIS — R262 Difficulty in walking, not elsewhere classified: Secondary | ICD-10-CM

## 2015-04-26 DIAGNOSIS — M25661 Stiffness of right knee, not elsewhere classified: Secondary | ICD-10-CM

## 2015-04-26 DIAGNOSIS — R2681 Unsteadiness on feet: Secondary | ICD-10-CM

## 2015-04-26 DIAGNOSIS — R600 Localized edema: Secondary | ICD-10-CM

## 2015-04-26 DIAGNOSIS — R269 Unspecified abnormalities of gait and mobility: Secondary | ICD-10-CM

## 2015-04-26 DIAGNOSIS — Z9889 Other specified postprocedural states: Secondary | ICD-10-CM

## 2015-04-26 DIAGNOSIS — M25561 Pain in right knee: Secondary | ICD-10-CM | POA: Diagnosis not present

## 2015-04-26 NOTE — Therapy (Signed)
[place], [place], [address] Phone: [phone]   Fax:  [phone]  Physical Therapy Treatment (Re-Assessment)  Patient Details  Name: Ariel Parker MRN: [id number] Date of Birth: Apr 08, 1990 Referring Provider: Dr. Maretta Los  Encounter Date: 04/26/2015      PT End of Session - 04/26/15 0939    Visit Number 32   Number of Visits 29   Date for PT Re-Evaluation 05/24/15   Authorization Type BCBS/ [employer] secondary (PROGRESS NOTE MUST BE DONE WEEKLY FOR MILITARY, give to patient each Friday)   Authorization Time Period 24/2/35-05/29/12; recert done 4/31   PT Start Time 0847   PT Stop Time 0930   PT Time Calculation (min) 43 min   Activity Tolerance Patient tolerated treatment well   Behavior During Therapy [facility] for tasks assessed/performed      Past Medical History  Diagnosis Date  . Disruption of anterior cruciate ligament of right knee 09/2014    Past Surgical History  Procedure Laterality Date  . Wisdom tooth extraction    . Knee arthroscopy w/ acl reconstruction Left 07/17/2006  . Knee arthroscopy with anterior cruciate ligament (acl) repair with hamstring graft Right 10/14/2014    Procedure: RIGHT KNEE ARTHROSCOPY WITH ANTERIOR CRUCIATE LIGAMENT (ACL) REPAIR;  Surgeon: Kathryne Hitch, MD;  Location: [place];  Service: Orthopedics;  Laterality: Right;  . Knee arthroscopy with lateral menisectomy Right 10/14/2014    Procedure: KNEE ARTHROSCOPY WITH LATERAL MENISECTOMY;  Surgeon: Kathryne Hitch, MD;  Location: [place];  Service: Orthopedics;  Laterality: Right;    There were no vitals filed for this visit.  Visit Diagnosis:  Unsteadiness - Plan: PT plan of care cert/re-cert  Knee stiffness, right - Plan: PT plan of care cert/re-cert  Difficulty walking - Plan: PT plan of care cert/re-cert  Abnormality of gait - Plan: PT plan of care cert/re-cert  S/P ACL repair - Plan: PT  plan of care cert/re-cert  Right knee pain - Plan: PT plan of care cert/re-cert  Localized edema - Plan: PT plan of care cert/re-cert      Subjective Assessment - 04/26/15 0848    Subjective Patient rerports she feels like she is doing well, has not increased weight in backpack yet but feels like it is getting easier to work with   Pertinent History Patient was playing soccer, jumped up and landed and heard a pop. ACL surgery was the 21st of July. Things at home have been going OK but at one point MD thought there might have been a blood clot, which did come back negative. Was taken off of CPM due to blood clot concern and to her knowledge is not going to be put back on CPM.    Currently in Pain? No/denies            [facility] PT Assessment - 04/26/15 0001    AROM   Right Knee Extension 0   Right Knee Flexion 126   Strength   Right Hip Flexion 5/5   Left Hip Flexion 5/5   Right Knee Extension 4+/5   Left Knee Extension 5/5                     [facility] Adult PT Treatment/Exercise - 04/26/15 0001    Knee/Hip Exercises: Seated   Other Seated Knee/Hip Exercises cybex plate 4 quads 5Q00; cybex hams plate 5 8Q76  PT Education - 04/26/15 [phone]    Education provided Yes   Education Details progress with skilled PT services, plan of care moving forward, change in POC    Person(s) Educated Patient   Methods Explanation   Comprehension Verbalized understanding          PT Short Term Goals - 04/26/15 0944    PT SHORT TERM GOAL #1   Title Patient will demonstrate R knee ROM of 0-120 degrees with no pain   Baseline 1/4- 0 to 126   Time 3   Period Weeks   Status Achieved   PT SHORT TERM GOAL #2   Title Patient will demonstrate at least 4-/5 strength in R knee and at least 4+/5 strength in L LE    Time 3   Period Weeks   Status Achieved   PT SHORT TERM GOAL #3   Title Patient to be ambulating unlimited distances without crutches and WBAT R LE,  equal step lengths, equal weight bearing each side, minimal unsteadiness, pain no more than 1/10   Baseline Met with some partial swelling thereafter, but tolerated well.    Time 3   Period Weeks   Status Achieved   PT SHORT TERM GOAL #4   Title Patient will experience 0/10 pain R knee  with all functional weight bearing tasks and exercises    Time 3   Period Weeks   Status Achieved   PT SHORT TERM GOAL #5   Title Patient to be independent in consistently and correctly performing  appropriate HEP, to be updated PRN    Baseline 08/29/216:  Reports compliance daily   Time 3   Period Weeks   Status Achieved           PT Long Term Goals - 04/26/15 2122    PT LONG TERM GOAL #1   Title Patient to have 0 degrees extension  and 130 degrees flexion R knee, pain 0/10    Time 6   Period Weeks   Status On-going   PT LONG TERM GOAL #2   Title Patient will be able to perform single leg squat to floor with R knee, minimal knee valgus, and pain 0/10   Baseline 1/31- still about halfway down before demonstrating poor muscle control    Time 6   Period Weeks   Status On-going   PT LONG TERM GOAL #3   Title Patient will be able to perform single leg hops of equal distance with bilateral lower extremities and pain 0/10 R knee    Baseline 1/30- 37.5 L, 33.5 R    Time 6   Period Weeks   Status On-going   PT LONG TERM GOAL #4   Title Patient to be able to jump off of at least 14 inch box and land on bilateral feet with good technique, good knee positioning with minimal knee valgus, and pain R knee 0/10   Time 6   Period Weeks   Status Achieved   PT LONG TERM GOAL #5   Title Patient to be able to maintain single leg stance on BOSU for at least 30 seconds with mild to moderate perturbations, pain R knee 0/10   Baseline 1/30- difficulty maintaining SLS with perturbations    Time 6   Period Weeks   Status On-going   PT LONG TERM GOAL #6   Title Patient will demonstrate the ability to safely  ascend and descend full height ladder and crawl around obstacles with pain R knee  0/10   Baseline 1/30- able to crawl, did not test ladder    PT LONG TERM GOAL #7   Title Pt will ambulate on uneven surface carrying 50 lbs in backpack to simulate [employer] training exercise.    Baseline 1/30- has been walking inside with weigth up to 38.6 pounds    Time 6   Period Weeks   Status On-going               Plan - 04/26/15 6803    Clinical Impression Statement Re-assesement performed today. Continue to note ongoing weakness with R LE musculature with R quads still being approximately 64% of L and R hams still being approx 82% of L; 1RM R quad 45#, 57.3# hamstring 1RM. Also continues to have difficulty  with dynamic functional strength tasks including single leg squats and hops, maintaining balance on BOSU with perturbations. Patient did report  some unusual  aching and pain around her knee today, which she feels did impair her performance today. She has MD appointment later this week and was encouraged to speak to MD regarding this pain. Recommend continuing another 4 weeks to continue improving dynamic stability and reaching optimal level of function.     Pt will benefit from skilled therapeutic intervention in order to improve on the following deficits Abnormal gait;Decreased coordination;Decreased range of motion;Difficulty walking;Impaired tone;Decreased safety awareness;Decreased activity tolerance;Pain;Decreased balance;Impaired flexibility;Improper body mechanics;Decreased mobility;Decreased strength;Increased edema;Postural dysfunction   Rehab Potential Excellent   PT Frequency 2x / week   PT Duration 4 weeks   PT Treatment/Interventions ADLs/Self Care Home Management;Cryotherapy;Electrical Stimulation;DME Instruction;Gait training;Stair training;Functional mobility training;Therapeutic activities;Therapeutic exercise;Balance training;Neuromuscular re-education;Patient/family education;Manual  techniques;Passive range of motion   PT Next Visit Plan More time on Cybex for R quad and ham. Progress single leg motor control, continue with plyometric and eccentric strengthening; work on exercise and mobility with weighted backpack on;cybex machines for R quad and ham. Emphasis must be on increasing quadricep strength. Optimally we would like quad to be at least 85% of the Lt quad.  Progress note to [place] every Friday.    PT Home Exercise Plan No changes this session    Consulted and Agree with Plan of Care Patient        Problem List There are no active problems to display for this patient.  Physical Therapy Progress Note  Dates of Reporting Period: 03/30/15 to 04/26/15  Objective Reports of Subjective Statement: see above   Objective Measurements: see above   Goal Update: see above   Plan: see above   Reason Skilled Services are Required: dynamic stability and strength, improve 1RM measures     Deniece Ree PT, DPT [place], [place], [address] Phone: [phone]   Fax:  [phone]  Name: Ariel Parker MRN: [id number] Date of Birth: March 18, 1991

## 2015-04-28 ENCOUNTER — Ambulatory Visit (HOSPITAL_COMMUNITY): Payer: Federal, State, Local not specified - PPO | Attending: Orthopedic Surgery | Admitting: Physical Therapy

## 2015-04-28 DIAGNOSIS — M25561 Pain in right knee: Secondary | ICD-10-CM | POA: Insufficient documentation

## 2015-04-28 DIAGNOSIS — Z9889 Other specified postprocedural states: Secondary | ICD-10-CM | POA: Insufficient documentation

## 2015-04-28 DIAGNOSIS — R600 Localized edema: Secondary | ICD-10-CM | POA: Diagnosis present

## 2015-04-28 DIAGNOSIS — M25661 Stiffness of right knee, not elsewhere classified: Secondary | ICD-10-CM | POA: Diagnosis present

## 2015-04-28 DIAGNOSIS — R269 Unspecified abnormalities of gait and mobility: Secondary | ICD-10-CM | POA: Diagnosis present

## 2015-04-28 DIAGNOSIS — R262 Difficulty in walking, not elsewhere classified: Secondary | ICD-10-CM | POA: Diagnosis present

## 2015-04-28 DIAGNOSIS — R2681 Unsteadiness on feet: Secondary | ICD-10-CM | POA: Diagnosis not present

## 2015-04-28 NOTE — Therapy (Signed)
[place], [place], [address] Phone: [phone]   Fax:  [phone]  Physical Therapy Treatment  Patient Details  Name: Ariel Parker MRN: [id number] Date of Birth: Nov 26, 1990 Referring Provider: Dr. Maretta Los  Encounter Date: 04/28/2015      PT End of Session - 04/28/15 0944    Visit Number 41   Number of Visits 84   Date for PT Re-Evaluation 05/24/15   Authorization Type BCBS/ [employer] secondary (PROGRESS NOTE MUST BE DONE WEEKLY FOR MILITARY, give to patient each Friday)   Authorization Time Period 12/02/81-06/01/23; recert done 0/53   PT Start Time 0850   PT Stop Time 0955   PT Time Calculation (min) 65 min   Activity Tolerance Patient tolerated treatment well   Behavior During Therapy [facility] for tasks assessed/performed      Past Medical History  Diagnosis Date  . Disruption of anterior cruciate ligament of right knee 09/2014    Past Surgical History  Procedure Laterality Date  . Wisdom tooth extraction    . Knee arthroscopy w/ acl reconstruction Left 07/17/2006  . Knee arthroscopy with anterior cruciate ligament (acl) repair with hamstring graft Right 10/14/2014    Procedure: RIGHT KNEE ARTHROSCOPY WITH ANTERIOR CRUCIATE LIGAMENT (ACL) REPAIR;  Surgeon: Kathryne Hitch, MD;  Location: [place];  Service: Orthopedics;  Laterality: Right;  . Knee arthroscopy with lateral menisectomy Right 10/14/2014    Procedure: KNEE ARTHROSCOPY WITH LATERAL MENISECTOMY;  Surgeon: Kathryne Hitch, MD;  Location: [place];  Service: Orthopedics;  Laterality: Right;    There were no vitals filed for this visit.  Visit Diagnosis:  Unsteadiness  Knee stiffness, right  Difficulty walking  Abnormality of gait  S/P ACL repair  Right knee pain      Subjective Assessment - 04/28/15 1001    Subjective PT states she put approx 5 more pounds in her backpack.  States she is doing well without pain  currently but has had some pain and tenderness in her patellar tendon region.     Currently in Pain? No/denies                         [facility] Adult PT Treatment/Exercise - 04/28/15 0853    Knee/Hip Exercises: Stretches   Quad Stretch Right;3 reps;30 seconds   Quad Stretch Limitations standing   Knee/Hip Exercises: Aerobic   Stationary Bike 5 minutes seat at 5 at EOS   Elliptical 5 minutes backward at EOS   Other Aerobic warmup: 4 laps fast walk/jog with backpack   Knee/Hip Exercises: Machines for Strengthening   Cybex Knee Extension 4Pl Rt only 2X10   Cybex Knee Flexion 5Pl Rt only 2X10   Other Machine biodex isokinetic 10 reps 180-150-120 and back up   Knee/Hip Exercises: Standing   Functional Squat 10 reps   Functional Squat Limitations Rt only with backpack in front of chair with minimal UE assistance   Lunge Walking - Round Trips 2RT on balance beam with weighted pack   Stairs 5Rt on 7" side no UE's reciprocally and backpack   SLS single leg balance reach 10 reps 2" box with backpack   Cryotherapy   Number Minutes Cryotherapy 5 Minutes   Cryotherapy Location Knee  Rt patellar tendon   Type of Cryotherapy Ice massage                PT Education - 04/28/15 [phone]    Education  provided Yes   Education Details Patellar tendonitis occurance with ACL repair/rehab.  Encouraged to use ice massage following workouts to decrease flair ups   Person(s) Educated Patient   Methods Explanation;Demonstration   Comprehension Verbalized understanding          PT Short Term Goals - 04/26/15 0944    PT SHORT TERM GOAL #1   Title Patient will demonstrate R knee ROM of 0-120 degrees with no pain   Baseline 1/4- 0 to 126   Time 3   Period Weeks   Status Achieved   PT SHORT TERM GOAL #2   Title Patient will demonstrate at least 4-/5 strength in R knee and at least 4+/5 strength in L LE    Time 3   Period Weeks   Status Achieved   PT SHORT TERM GOAL #3   Title  Patient to be ambulating unlimited distances without crutches and WBAT R LE, equal step lengths, equal weight bearing each side, minimal unsteadiness, pain no more than 1/10   Baseline Met with some partial swelling thereafter, but tolerated well.    Time 3   Period Weeks   Status Achieved   PT SHORT TERM GOAL #4   Title Patient will experience 0/10 pain R knee  with all functional weight bearing tasks and exercises    Time 3   Period Weeks   Status Achieved   PT SHORT TERM GOAL #5   Title Patient to be independent in consistently and correctly performing  appropriate HEP, to be updated PRN    Baseline 08/29/216:  Reports compliance daily   Time 3   Period Weeks   Status Achieved           PT Long Term Goals - 04/26/15 2536    PT LONG TERM GOAL #1   Title Patient to have 0 degrees extension  and 130 degrees flexion R knee, pain 0/10    Time 6   Period Weeks   Status On-going   PT LONG TERM GOAL #2   Title Patient will be able to perform single leg squat to floor with R knee, minimal knee valgus, and pain 0/10   Baseline 1/31- still about halfway down before demonstrating poor muscle control    Time 6   Period Weeks   Status On-going   PT LONG TERM GOAL #3   Title Patient will be able to perform single leg hops of equal distance with bilateral lower extremities and pain 0/10 R knee    Baseline 1/30- 37.5 L, 33.5 R    Time 6   Period Weeks   Status On-going   PT LONG TERM GOAL #4   Title Patient to be able to jump off of at least 14 inch box and land on bilateral feet with good technique, good knee positioning with minimal knee valgus, and pain R knee 0/10   Time 6   Period Weeks   Status Achieved   PT LONG TERM GOAL #5   Title Patient to be able to maintain single leg stance on BOSU for at least 30 seconds with mild to moderate perturbations, pain R knee 0/10   Baseline 1/30- difficulty maintaining SLS with perturbations    Time 6   Period Weeks   Status On-going    PT LONG TERM GOAL #6   Title Patient will demonstrate the ability to safely ascend and descend full height ladder and crawl around obstacles with pain R knee 0/10   Baseline 1/30-  able to crawl, did not test ladder    PT LONG TERM GOAL #7   Title Pt will ambulate on uneven surface carrying 50 lbs in backpack to simulate [employer] training exercise.    Baseline 1/30- has been walking inside with weigth up to 38.6 pounds    Time 6   Period Weeks   Status On-going               Plan - 04/28/15 8372    Clinical Impression Statement Focused session on increasing quad>hamstring strength of Rt LE.  Completed most functional strengthening exercises with backpack on.  Resumed isokinetic biodex workout and cybex strengthening for Rt LE.  Changed to reverse on elliptical to target quads and addition of bike with seat forward to promote ROM.  Pt mostly c/o patellar tendon tightness, tenderness to the touch and pain/swelling following workouts.  Educated patient with relation of patellar tendonitis with ACL rehab due to heavy work on quadricep muscle. Added ice massage at end of session to patellar tendon and encouraged patient to complete this outside of therapy as well.  Pt verbalized understanding.     PT Next Visit Plan Continue with quadricep focus and ice massage at EOS to prevent swelling/pain. Progress single leg motor control, continue with plyometric and eccentric strengthening; work on exercise and mobility with weighted backpack on;cybex machines for R quad and ham. Emphasis must be on increasing quadricep strength. Optimally we would like quad to be at least 85% of the Lt quad.  Progress note to [place] every Friday.    PT Home Exercise Plan No changes this session    Consulted and Agree with Plan of Care Patient        Problem List There are no active problems to display for this patient.   Teena Irani, PTA/CLT [phone]  04/28/2015, 10:02 AM  [place] [address] [place], [place], [address] Phone: [phone]   Fax:  [phone]  Name: Ariel Parker MRN: [id number] Date of Birth: [birth year]

## 2015-05-03 ENCOUNTER — Ambulatory Visit (HOSPITAL_COMMUNITY): Payer: Federal, State, Local not specified - PPO | Admitting: Physical Therapy

## 2015-05-03 DIAGNOSIS — M25561 Pain in right knee: Secondary | ICD-10-CM

## 2015-05-03 DIAGNOSIS — Z9889 Other specified postprocedural states: Secondary | ICD-10-CM

## 2015-05-03 DIAGNOSIS — R600 Localized edema: Secondary | ICD-10-CM

## 2015-05-03 DIAGNOSIS — R262 Difficulty in walking, not elsewhere classified: Secondary | ICD-10-CM

## 2015-05-03 DIAGNOSIS — R2681 Unsteadiness on feet: Secondary | ICD-10-CM | POA: Diagnosis not present

## 2015-05-03 DIAGNOSIS — M25661 Stiffness of right knee, not elsewhere classified: Secondary | ICD-10-CM

## 2015-05-03 DIAGNOSIS — R269 Unspecified abnormalities of gait and mobility: Secondary | ICD-10-CM

## 2015-05-03 NOTE — Therapy (Signed)
[place], [place], [address] Phone: [phone]   Fax:  [phone]  Physical Therapy Treatment  Patient Details  Name: Ariel Parker MRN: [id number] Date of Birth: 10-28-1990 Referring Provider: Dr. Maretta Los  Encounter Date: 05/03/2015      PT End of Session - 05/03/15 0934    Visit Number 110   Number of Visits 54   Date for PT Re-Evaluation 05/24/15   Authorization Type BCBS/ [employer] secondary (PROGRESS NOTE MUST BE DONE WEEKLY FOR MILITARY, give to patient each Friday)   Authorization Time Period 55/9/74-03/31/36; recert done 4/53   PT Start Time 0847   PT Stop Time 0926   PT Time Calculation (min) 39 min   Activity Tolerance Patient tolerated treatment well   Behavior During Therapy [facility] for tasks assessed/performed      Past Medical History  Diagnosis Date  . Disruption of anterior cruciate ligament of right knee 09/2014    Past Surgical History  Procedure Laterality Date  . Wisdom tooth extraction    . Knee arthroscopy w/ acl reconstruction Left 07/17/2006  . Knee arthroscopy with anterior cruciate ligament (acl) repair with hamstring graft Right 10/14/2014    Procedure: RIGHT KNEE ARTHROSCOPY WITH ANTERIOR CRUCIATE LIGAMENT (ACL) REPAIR;  Surgeon: Kathryne Hitch, MD;  Location: [place];  Service: Orthopedics;  Laterality: Right;  . Knee arthroscopy with lateral menisectomy Right 10/14/2014    Procedure: KNEE ARTHROSCOPY WITH LATERAL MENISECTOMY;  Surgeon: Kathryne Hitch, MD;  Location: [place];  Service: Orthopedics;  Laterality: Right;    There were no vitals filed for this visit.  Visit Diagnosis:  Unsteadiness  Knee stiffness, right  Difficulty walking  Abnormality of gait  S/P ACL repair  Right knee pain  Localized edema      Subjective Assessment - 05/03/15 0856    Subjective Patient reports that she is doing well today, pain in knee is reduced but  she is aware of icing and care of patellar tendon to assist in avoiding tendonitis    Pertinent History Patient was playing soccer, jumped up and landed and heard a pop. ACL surgery was the 21st of July. Things at home have been going OK but at one point MD thought there might have been a blood clot, which did come back negative. Was taken off of CPM due to blood clot concern and to her knowledge is not going to be put back on CPM.    Currently in Pain? No/denies                         [facility] Adult PT Treatment/Exercise - 05/03/15 0001    Knee/Hip Exercises: Aerobic   Other Aerobic warmup: 3 laps fast walk, 3 laps jog with backback    Knee/Hip Exercises: Machines for Strengthening   Cybex Knee Extension 4Pl Rt only 3X10; 5Pl only 1x5    Cybex Knee Flexion 5Pl Rt only 3X10; 6Pl 1x5   Knee/Hip Exercises: Standing   Heel Raises Right;15 reps   Stairs 5Rt on 7" side no UE's reciprocally and backpack   SLS single leg balance reach 10 reps 2" box with backpack   Other Standing Knee Exercises prone to stand with backpack 1x5 each side    Knee/Hip Exercises: Seated   Other Seated Knee/Hip Exercises --   Manual Therapy   Manual Therapy Soft tissue mobilization;Edema management   Manual therapy comments performed separtely from other skilled  interventions    Edema Management Retro massage with LE elevated for edema control   Soft tissue mobilization cross-friction massage R patellar tendon                 PT Education - 05/03/15 0934    Education provided Yes   Education Details encouraged to keep icing patellar tendon at home after workouts; also cross-friction massage    Person(s) Educated Patient   Methods Explanation   Comprehension Verbalized understanding          PT Short Term Goals - 04/26/15 0944    PT SHORT TERM GOAL #1   Title Patient will demonstrate R knee ROM of 0-120 degrees with no pain   Baseline 1/4- 0 to 126   Time 3   Period Weeks   Status  Achieved   PT SHORT TERM GOAL #2   Title Patient will demonstrate at least 4-/5 strength in R knee and at least 4+/5 strength in L LE    Time 3   Period Weeks   Status Achieved   PT SHORT TERM GOAL #3   Title Patient to be ambulating unlimited distances without crutches and WBAT R LE, equal step lengths, equal weight bearing each side, minimal unsteadiness, pain no more than 1/10   Baseline Met with some partial swelling thereafter, but tolerated well.    Time 3   Period Weeks   Status Achieved   PT SHORT TERM GOAL #4   Title Patient will experience 0/10 pain R knee  with all functional weight bearing tasks and exercises    Time 3   Period Weeks   Status Achieved   PT SHORT TERM GOAL #5   Title Patient to be independent in consistently and correctly performing  appropriate HEP, to be updated PRN    Baseline 08/29/216:  Reports compliance daily   Time 3   Period Weeks   Status Achieved           PT Long Term Goals - 04/26/15 9702    PT LONG TERM GOAL #1   Title Patient to have 0 degrees extension  and 130 degrees flexion R knee, pain 0/10    Time 6   Period Weeks   Status On-going   PT LONG TERM GOAL #2   Title Patient will be able to perform single leg squat to floor with R knee, minimal knee valgus, and pain 0/10   Baseline 1/31- still about halfway down before demonstrating poor muscle control    Time 6   Period Weeks   Status On-going   PT LONG TERM GOAL #3   Title Patient will be able to perform single leg hops of equal distance with bilateral lower extremities and pain 0/10 R knee    Baseline 1/30- 37.5 L, 33.5 R    Time 6   Period Weeks   Status On-going   PT LONG TERM GOAL #4   Title Patient to be able to jump off of at least 14 inch box and land on bilateral feet with good technique, good knee positioning with minimal knee valgus, and pain R knee 0/10   Time 6   Period Weeks   Status Achieved   PT LONG TERM GOAL #5   Title Patient to be able to maintain  single leg stance on BOSU for at least 30 seconds with mild to moderate perturbations, pain R knee 0/10   Baseline 1/30- difficulty maintaining SLS with perturbations    Time  6   Period Weeks   Status On-going   PT LONG TERM GOAL #6   Title Patient will demonstrate the ability to safely ascend and descend full height ladder and crawl around obstacles with pain R knee 0/10   Baseline 1/30- able to crawl, did not test ladder    PT LONG TERM GOAL #7   Title Pt will ambulate on uneven surface carrying 50 lbs in backpack to simulate [employer] training exercise.    Baseline 1/30- has been walking inside with weigth up to 38.6 pounds    Time 6   Period Weeks   Status On-going               Plan - 05/03/15 0913    Clinical Impression Statement Continued to focus session on increasing quad and hamstring strength of R LE. Majority of exercises completed with backpack on or on cybex machines. Continued to encourage patient to ice patellar tendon at home as well in order to asisst in reducing risk of developing patellar tendonitis from heavy focus on quads  at PT.  INtroduced cross-friction and retromassage massage to patellar tendon to assist in increasing bloodflow to area and reducing pain/preventing inflammation. Backpack weighting 31.6 pounds today.    Pt will benefit from skilled therapeutic intervention in order to improve on the following deficits Abnormal gait;Decreased coordination;Decreased range of motion;Difficulty walking;Impaired tone;Decreased safety awareness;Decreased activity tolerance;Pain;Decreased balance;Impaired flexibility;Improper body mechanics;Decreased mobility;Decreased strength;Increased edema;Postural dysfunction   Rehab Potential Excellent   PT Frequency 2x / week   PT Duration 4 weeks   PT Treatment/Interventions ADLs/Self Care Home Management;Cryotherapy;Electrical Stimulation;DME Instruction;Gait training;Stair training;Functional mobility training;Therapeutic  activities;Therapeutic exercise;Balance training;Neuromuscular re-education;Patient/family education;Manual techniques;Passive range of motion   PT Next Visit Plan Continue with quadricep focus and ice massage at EOS to prevent swelling/pain. Progress single leg motor control, continue with plyometric and eccentric strengthening; work on exercise and mobility with weighted backpack on;cybex machines for R quad and ham. Emphasis must be on increasing quadricep strength. Optimally we would like quad to be at least 85% of the Lt quad.  Progress note to [place] every Friday.    PT Home Exercise Plan No changes this session    Consulted and Agree with Plan of Care Patient        Problem List There are no active problems to display for this patient.   Deniece Ree PT, DPT [phone]  [facility] [address] [place], [place], [address] Phone: [phone]   Fax:  [phone]  Name: Ariel Parker MRN: [id number] Date of Birth: 1990/05/10

## 2015-05-05 ENCOUNTER — Ambulatory Visit (HOSPITAL_COMMUNITY): Payer: Federal, State, Local not specified - PPO

## 2015-05-05 DIAGNOSIS — M25561 Pain in right knee: Secondary | ICD-10-CM

## 2015-05-05 DIAGNOSIS — R2681 Unsteadiness on feet: Secondary | ICD-10-CM

## 2015-05-05 DIAGNOSIS — R262 Difficulty in walking, not elsewhere classified: Secondary | ICD-10-CM

## 2015-05-05 DIAGNOSIS — M25661 Stiffness of right knee, not elsewhere classified: Secondary | ICD-10-CM

## 2015-05-05 DIAGNOSIS — R269 Unspecified abnormalities of gait and mobility: Secondary | ICD-10-CM

## 2015-05-05 DIAGNOSIS — Z9889 Other specified postprocedural states: Secondary | ICD-10-CM

## 2015-05-05 DIAGNOSIS — R600 Localized edema: Secondary | ICD-10-CM

## 2015-05-05 NOTE — Therapy (Signed)
[place], [place], [address] Phone: [phone]   Fax:  [phone]  Physical Therapy Treatment  Patient Details  Name: Ariel Parker MRN: [id number] Date of Birth: 1990-04-10 Referring Provider: Dr. Maretta Los  Encounter Date: 05/05/2015      PT End of Session - 05/05/15 0804    Visit Number 38   Number of Visits 34   Date for PT Re-Evaluation 05/24/15   Authorization Type BCBS/ [employer] secondary (PROGRESS NOTE MUST BE DONE WEEKLY FOR MILITARY, give to patient each Friday)   Authorization Time Period 90/3/00-11/26/31; recert done 0/07   PT Start Time 0802   PT Stop Time 0848   PT Time Calculation (min) 46 min   Equipment Utilized During Treatment --  weighted backpack for RTW activities   Activity Tolerance Patient tolerated treatment well   Behavior During Therapy [facility] for tasks assessed/performed      Past Medical History  Diagnosis Date  . Disruption of anterior cruciate ligament of right knee 09/2014    Past Surgical History  Procedure Laterality Date  . Wisdom tooth extraction    . Knee arthroscopy w/ acl reconstruction Left 07/17/2006  . Knee arthroscopy with anterior cruciate ligament (acl) repair with hamstring graft Right 10/14/2014    Procedure: RIGHT KNEE ARTHROSCOPY WITH ANTERIOR CRUCIATE LIGAMENT (ACL) REPAIR;  Surgeon: Kathryne Hitch, MD;  Location: [place];  Service: Orthopedics;  Laterality: Right;  . Knee arthroscopy with lateral menisectomy Right 10/14/2014    Procedure: KNEE ARTHROSCOPY WITH LATERAL MENISECTOMY;  Surgeon: Kathryne Hitch, MD;  Location: [place];  Service: Orthopedics;  Laterality: Right;    There were no vitals filed for this visit.  Visit Diagnosis:  Unsteadiness  Knee stiffness, right  Difficulty walking  Abnormality of gait  S/P ACL repair  Right knee pain  Localized edema      Subjective Assessment - 05/05/15 0803     Subjective Knee is feeling good today, no reports of pain.     Pertinent History Patient was playing soccer, jumped up and landed and heard a pop. ACL surgery was the 21st of July. Things at home have been going OK but at one point MD thought there might have been a blood clot, which did come back negative. Was taken off of CPM due to blood clot concern and to her knowledge is not going to be put back on CPM.    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 No/denies              [facility] Adult PT Treatment/Exercise - 05/05/15 0001    Knee/Hip Exercises: Stretches   Sports administrator Right;3 reps;30 seconds   Quad Stretch Limitations standing   Knee/Hip Exercises: Aerobic   Other Aerobic warmup: 3 laps fast walk, 3 laps jog with backback    Knee/Hip Exercises: Machines for Strengthening   Cybex Knee Extension 4.5 Pl Rt only 3X10; 5.5Pl only 1x10   Cybex Knee Flexion 5 Pl Rt only 3X10; 6Pl 1x5   Other Machine biodex isokinetic 10 reps 180-150-120 and back up   Knee/Hip Exercises: Standing   Functional Squat 15 reps   Functional Squat Limitations Rt only with backpack in front of chair with minimal UE assistance   SLS single leg balance reach 10 reps 2" box with backpack   Cryotherapy   Number Minutes Cryotherapy 5  Minutes   Cryotherapy Location Knee   Type of Cryotherapy Ice massage            PT Short Term Goals - 04/26/15 0944    PT SHORT TERM GOAL #1   Title Patient will demonstrate R knee ROM of 0-120 degrees with no pain   Baseline 1/4- 0 to 126   Time 3   Period Weeks   Status Achieved   PT SHORT TERM GOAL #2   Title Patient will demonstrate at least 4-/5 strength in R knee and at least 4+/5 strength in L LE    Time 3   Period Weeks   Status Achieved   PT SHORT TERM GOAL #3   Title Patient to be ambulating unlimited distances without crutches and WBAT R LE, equal step  lengths, equal weight bearing each side, minimal unsteadiness, pain no more than 1/10   Baseline Met with some partial swelling thereafter, but tolerated well.    Time 3   Period Weeks   Status Achieved   PT SHORT TERM GOAL #4   Title Patient will experience 0/10 pain R knee  with all functional weight bearing tasks and exercises    Time 3   Period Weeks   Status Achieved   PT SHORT TERM GOAL #5   Title Patient to be independent in consistently and correctly performing  appropriate HEP, to be updated PRN    Baseline 08/29/216:  Reports compliance daily   Time 3   Period Weeks   Status Achieved           PT Long Term Goals - 04/26/15 5732    PT LONG TERM GOAL #1   Title Patient to have 0 degrees extension  and 130 degrees flexion R knee, pain 0/10    Time 6   Period Weeks   Status On-going   PT LONG TERM GOAL #2   Title Patient will be able to perform single leg squat to floor with R knee, minimal knee valgus, and pain 0/10   Baseline 1/31- still about halfway down before demonstrating poor muscle control    Time 6   Period Weeks   Status On-going   PT LONG TERM GOAL #3   Title Patient will be able to perform single leg hops of equal distance with bilateral lower extremities and pain 0/10 R knee    Baseline 1/30- 37.5 L, 33.5 R    Time 6   Period Weeks   Status On-going   PT LONG TERM GOAL #4   Title Patient to be able to jump off of at least 14 inch box and land on bilateral feet with good technique, good knee positioning with minimal knee valgus, and pain R knee 0/10   Time 6   Period Weeks   Status Achieved   PT LONG TERM GOAL #5   Title Patient to be able to maintain single leg stance on BOSU for at least 30 seconds with mild to moderate perturbations, pain R knee 0/10   Baseline 1/30- difficulty maintaining SLS with perturbations    Time 6   Period Weeks   Status On-going   PT LONG TERM GOAL #6   Title Patient will demonstrate the ability to safely ascend and  descend full height ladder and crawl around obstacles with pain R knee 0/10   Baseline 1/30- able to crawl, did not test ladder    PT LONG TERM GOAL #7   Title Pt will ambulate on uneven  surface carrying 50 lbs in backpack to simulate [employer] training exercise.    Baseline 1/30- has been walking inside with weigth up to 38.6 pounds    Time 6   Period Weeks   Status On-going               Plan - 05/05/15 1739    Clinical Impression Statement Continued session focus on functional strengthening primarly quad and hamstrings strengthening.  All standing exercises complete with 31.5 weighted backpack.  Pt demonstrated improved mechanics with jogging this session with increased ease.  Able to increased weight with cybex for quad strenghtening with no reports of pain, noted visible muscualture fatgiue.  Pt c/o "popping" with knee extensio on biodex isokinetic machine today with pain scale 1/10.  Completed ice massage at end of session with no reports of pain following and ability to complete quad set without pain.  Pt encouraged to complete ice massage to patella tendon at home for pain control.   PT Next Visit Plan Continue with quadricep focus and ice massage at EOS to prevent swelling/pain. Progress single leg motor control, continue with plyometric and eccentric strengthening; work on exercise and mobility with weighted backpack on;cybex machines for R quad and ham. Emphasis must be on increasing quadricep strength. Optimally we would like quad to be at least 85% of the Lt quad.  Progress note to [place] every Friday.         Problem List There are no active problems to display for this patient.  [address], LPTA; [place]  Aldona Lento 05/05/2015, 5:48 PM  [facility] [address] [place], [place], [address] Phone: [phone]   Fax:  [phone]  Name: Ariel Parker MRN: [id number] Date of Birth: 27-Dec-1990

## 2015-05-10 ENCOUNTER — Ambulatory Visit (HOSPITAL_COMMUNITY): Payer: Federal, State, Local not specified - PPO | Admitting: Physical Therapy

## 2015-05-10 DIAGNOSIS — R262 Difficulty in walking, not elsewhere classified: Secondary | ICD-10-CM

## 2015-05-10 DIAGNOSIS — R2681 Unsteadiness on feet: Secondary | ICD-10-CM

## 2015-05-10 DIAGNOSIS — R600 Localized edema: Secondary | ICD-10-CM

## 2015-05-10 DIAGNOSIS — R269 Unspecified abnormalities of gait and mobility: Secondary | ICD-10-CM

## 2015-05-10 DIAGNOSIS — M25661 Stiffness of right knee, not elsewhere classified: Secondary | ICD-10-CM

## 2015-05-10 DIAGNOSIS — M25561 Pain in right knee: Secondary | ICD-10-CM

## 2015-05-10 DIAGNOSIS — Z9889 Other specified postprocedural states: Secondary | ICD-10-CM

## 2015-05-10 NOTE — Therapy (Signed)
[place], [place], [address] Phone: [phone]   Fax:  [phone]  Physical Therapy Treatment  Patient Details  Name: Ariel Parker MRN: [id number] Date of Birth: 04/30/1990 Referring Provider: Dr. Maretta Los  Encounter Date: 05/10/2015      PT End of Session - 05/10/15 1450    Visit Number 60   Number of Visits 55   Date for PT Re-Evaluation 05/24/15   Authorization Type BCBS/ [employer] secondary (PROGRESS NOTE MUST BE DONE WEEKLY FOR MILITARY, give to patient each Friday)   Authorization Time Period 50/5/39-09/29/71; recert done 4/19   PT Start Time 0848   PT Stop Time 0940   PT Time Calculation (min) 52 min   Equipment Utilized During Treatment --  weighted backpack for RTW activities   Activity Tolerance Patient tolerated treatment well   Behavior During Therapy [facility] for tasks assessed/performed      Past Medical History  Diagnosis Date  . Disruption of anterior cruciate ligament of right knee 09/2014    Past Surgical History  Procedure Laterality Date  . Wisdom tooth extraction    . Knee arthroscopy w/ acl reconstruction Left 07/17/2006  . Knee arthroscopy with anterior cruciate ligament (acl) repair with hamstring graft Right 10/14/2014    Procedure: RIGHT KNEE ARTHROSCOPY WITH ANTERIOR CRUCIATE LIGAMENT (ACL) REPAIR;  Surgeon: Kathryne Hitch, MD;  Location: [place];  Service: Orthopedics;  Laterality: Right;  . Knee arthroscopy with lateral menisectomy Right 10/14/2014    Procedure: KNEE ARTHROSCOPY WITH LATERAL MENISECTOMY;  Surgeon: Kathryne Hitch, MD;  Location: [place];  Service: Orthopedics;  Laterality: Right;    There were no vitals filed for this visit.  Visit Diagnosis:  Unsteadiness  Knee stiffness, right  Difficulty walking  S/P ACL repair  Abnormality of gait  Right knee pain  Localized edema      Subjective Assessment - 05/10/15 1501    Subjective Pt states she is doing well today, no pain.  States the ice massage took her discomfort away after last session.   Currently in Pain? No/denies                         [facility] Adult PT Treatment/Exercise - 05/10/15 1500    Knee/Hip Exercises: Stretches   Sports administrator Right;3 reps;30 seconds   Quad Stretch Limitations standing   Knee/Hip Exercises: Aerobic   Other Aerobic warmup: 1 lap in PL fast walk, 1 lap in PL jog with backback    Knee/Hip Exercises: Machines for Strengthening   Cybex Knee Extension 4.5 Pl Rt only 3X10; 5.5Pl only 1x10   Cybex Knee Flexion 5 Pl Rt only 3X10; 6Pl 1x10   Other Machine biodex isokinetic 10 reps 180-150-120 and back up   Knee/Hip Exercises: Standing   Functional Squat 15 reps   Functional Squat Limitations Rt only with backpack in front of chair with minimal UE assistance   SLS single leg balance reach 15 reps 2" box with backpack   Cryotherapy   Number Minutes Cryotherapy 5 Minutes   Cryotherapy Location Knee   Type of Cryotherapy Ice massage   Manual Therapy   Manual Therapy Soft tissue mobilization   Manual therapy comments performed separtely from other skilled interventions    Soft tissue mobilization cross-friction massage R patellar tendon                   PT Short Term Goals -  04/26/15 0944    PT SHORT TERM GOAL #1   Title Patient will demonstrate R knee ROM of 0-120 degrees with no pain   Baseline 1/4- 0 to 126   Time 3   Period Weeks   Status Achieved   PT SHORT TERM GOAL #2   Title Patient will demonstrate at least 4-/5 strength in R knee and at least 4+/5 strength in L LE    Time 3   Period Weeks   Status Achieved   PT SHORT TERM GOAL #3   Title Patient to be ambulating unlimited distances without crutches and WBAT R LE, equal step lengths, equal weight bearing each side, minimal unsteadiness, pain no more than 1/10   Baseline Met with some partial swelling thereafter, but tolerated well.     Time 3   Period Weeks   Status Achieved   PT SHORT TERM GOAL #4   Title Patient will experience 0/10 pain R knee  with all functional weight bearing tasks and exercises    Time 3   Period Weeks   Status Achieved   PT SHORT TERM GOAL #5   Title Patient to be independent in consistently and correctly performing  appropriate HEP, to be updated PRN    Baseline 08/29/216:  Reports compliance daily   Time 3   Period Weeks   Status Achieved           PT Long Term Goals - 04/26/15 1610    PT LONG TERM GOAL #1   Title Patient to have 0 degrees extension  and 130 degrees flexion R knee, pain 0/10    Time 6   Period Weeks   Status On-going   PT LONG TERM GOAL #2   Title Patient will be able to perform single leg squat to floor with R knee, minimal knee valgus, and pain 0/10   Baseline 1/31- still about halfway down before demonstrating poor muscle control    Time 6   Period Weeks   Status On-going   PT LONG TERM GOAL #3   Title Patient will be able to perform single leg hops of equal distance with bilateral lower extremities and pain 0/10 R knee    Baseline 1/30- 37.5 L, 33.5 R    Time 6   Period Weeks   Status On-going   PT LONG TERM GOAL #4   Title Patient to be able to jump off of at least 14 inch box and land on bilateral feet with good technique, good knee positioning with minimal knee valgus, and pain R knee 0/10   Time 6   Period Weeks   Status Achieved   PT LONG TERM GOAL #5   Title Patient to be able to maintain single leg stance on BOSU for at least 30 seconds with mild to moderate perturbations, pain R knee 0/10   Baseline 1/30- difficulty maintaining SLS with perturbations    Time 6   Period Weeks   Status On-going   PT LONG TERM GOAL #6   Title Patient will demonstrate the ability to safely ascend and descend full height ladder and crawl around obstacles with pain R knee 0/10   Baseline 1/30- able to crawl, did not test ladder    PT LONG TERM GOAL #7   Title Pt  will ambulate on uneven surface carrying 50 lbs in backpack to simulate [employer] training exercise.    Baseline 1/30- has been walking inside with weigth up to 38.6 pounds  Time 6   Period Weeks   Status On-going               Plan - 05/10/15 1453    Clinical Impression Statement Completed warm up outside this morning with 2 laps around the parking lot.  Focus continued on increasing functional quad and hamstirng strength in Rt LE.  Pt with most difficulty with single leg squat, only able to achieve 1/5 ROM today.  All exercises with exception of cybex machines completeed with 31.5# backpack.  No complaints of "popping" with full knee extension, however did report discomfort at proximal scar.  Completed friction massage to this area followed by ice massage to decrease pain.  Pt reported all discomfort gone at end of session.     PT Next Visit Plan Continue with quadricep focus and ice massage at EOS to prevent swelling/pain. Progress single leg motor control, continue with plyometric and eccentric strengthening; work on exercise and mobility with weighted backpack on;cybex machines for R quad and ham. Emphasis must be on increasing quadricep strength. Optimally we would like quad to be at least 85% of the Lt quad.  Progress note to [place] every Friday.         Problem List There are no active problems to display for this patient.   Teena Irani, PTA/CLT [phone]  05/10/2015, 3:02 PM  [facility] [address] [place], [place], [address] Phone: [phone]   Fax:  [phone]  Name: Ariel Parker MRN: [id number] Date of Birth: Aug 09, 1990

## 2015-05-12 ENCOUNTER — Ambulatory Visit (HOSPITAL_COMMUNITY): Payer: Federal, State, Local not specified - PPO | Admitting: Physical Therapy

## 2015-05-12 DIAGNOSIS — R2681 Unsteadiness on feet: Secondary | ICD-10-CM | POA: Diagnosis not present

## 2015-05-12 DIAGNOSIS — Z9889 Other specified postprocedural states: Secondary | ICD-10-CM

## 2015-05-12 DIAGNOSIS — R600 Localized edema: Secondary | ICD-10-CM

## 2015-05-12 DIAGNOSIS — R269 Unspecified abnormalities of gait and mobility: Secondary | ICD-10-CM

## 2015-05-12 DIAGNOSIS — M25561 Pain in right knee: Secondary | ICD-10-CM

## 2015-05-12 DIAGNOSIS — M25661 Stiffness of right knee, not elsewhere classified: Secondary | ICD-10-CM

## 2015-05-12 DIAGNOSIS — R262 Difficulty in walking, not elsewhere classified: Secondary | ICD-10-CM

## 2015-05-12 NOTE — Therapy (Signed)
[place], [place], [address] Phone: [phone]   Fax:  [phone]  Physical Therapy Treatment  Patient Details  Name: Ariel Parker MRN: [id number] Date of Birth: 1990/07/15 Referring Provider: Dr. Maretta Los  Encounter Date: 05/12/2015      PT End of Session - 05/12/15 0909    Visit Number 92   Number of Visits 35   Date for PT Re-Evaluation 05/24/15   Authorization Type BCBS/ [employer] secondary (PROGRESS NOTE MUST BE DONE WEEKLY FOR MILITARY, give to patient each Friday)   Authorization Time Period 98/2/64-03/30/81; recert done 0/94   PT Start Time 0850   PT Stop Time 0930   PT Time Calculation (min) 40 min   Activity Tolerance Patient tolerated treatment well      Past Medical History  Diagnosis Date  . Disruption of anterior cruciate ligament of right knee 09/2014    Past Surgical History  Procedure Laterality Date  . Wisdom tooth extraction    . Knee arthroscopy w/ acl reconstruction Left 07/17/2006  . Knee arthroscopy with anterior cruciate ligament (acl) repair with hamstring graft Right 10/14/2014    Procedure: RIGHT KNEE ARTHROSCOPY WITH ANTERIOR CRUCIATE LIGAMENT (ACL) REPAIR;  Surgeon: Kathryne Hitch, MD;  Location: [place];  Service: Orthopedics;  Laterality: Right;  . Knee arthroscopy with lateral menisectomy Right 10/14/2014    Procedure: KNEE ARTHROSCOPY WITH LATERAL MENISECTOMY;  Surgeon: Kathryne Hitch, MD;  Location: [facility];  Service: Orthopedics;  Laterality: Right;    There were no vitals filed for this visit.  Visit Diagnosis:  Unsteadiness  Knee stiffness, right  Difficulty walking  S/P ACL repair  Abnormality of gait  Right knee pain  Localized edema      Subjective Assessment - 05/12/15 0847    Subjective Pt continues to work out at [employer].  Denies paim   How long can you sit comfortably? no limitations   How long can you stand  comfortably? 4+ hours   How long can you walk comfortably? 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 No/denies               [facility] Adult PT Treatment/Exercise - 05/12/15 0903    Knee/Hip Exercises: Stretches   Other Knee/Hip Stretches long sitting hamstring stretch x 2'   Knee/Hip Exercises: Machines for Strengthening   Cybex Knee Extension 1 max rep:  Rt 57.5; Lt 74.5= 77%   Cybex Knee Flexion 1 max rep: Rt 74.5 #; Lt 62#    Other Machine quad was at 64.5% of Lt now at 77%; Hamstring remains between 82-84%.     Knee/Hip Exercises: Standing   Heel Raises Right;20 reps   Terminal Knee Extension Strengthening;Right;20 reps   Theraband Level (Terminal Knee Extension) Level 2 (Red)   Wall Squat 2 sets;10 reps;5 seconds;Other (comment)   Wall Squat Limitations Rt leg only with ball on wall    Knee/Hip Exercises: Supine   Terminal Knee Extension Strengthening;Right;10 reps   Balance Poses   Warrior III Time with 3# in both hands x one minute x 2 reps             PT Education - 05/12/15 0915    Education provided Yes   Education Details terminal extension supine and standing, long sitting hamstring stretch.   Person(s) Educated Patient  Methods Explanation;Tactile cues   Comprehension Verbalized understanding;Returned demonstration          PT Short Term Goals - 04/26/15 0944    PT SHORT TERM GOAL #1   Title Patient will demonstrate Rt knee ROM of 0-120 degrees with no pain   Baseline 1/4- 0 to 126   Time 3   Period Weeks   Status Achieved   PT SHORT TERM GOAL #2   Title Patient will demonstrate at least 4-/5 strength in Rt knee and at least 4+/5 strength in Lt LE    Time 3   Period Weeks   Status Achieved   PT SHORT TERM GOAL #3   Title Patient to be ambulating unlimited distances without crutches and WBAT Rt LE, equal step lengths, equal weight  bearing each side, minimal unsteadiness, pain no more than 1/10   Baseline Met with some partial swelling thereafter, but tolerated well.    Time 3   Period Weeks   Status Achieved   PT SHORT TERM GOAL #4   Title Patient will experience 0/10 pain Rt knee  with all functional weight bearing tasks and exercises    Time 3   Period Weeks   Status Achieved   PT SHORT TERM GOAL #5   Title Patient to be independent in consistently and correctly performing  appropriate HEP, to be updated PRN    Baseline 08/29/216:  Reports compliance daily   Time 3   Period Weeks   Status Achieved           PT Long Term Goals - 04/26/15 7062    PT LONG TERM GOAL #1   Title Patient to have 0 degrees extension  and 130 degrees flexion Rt knee, pain 0/10    Time 6   Period Weeks   Status On-going   PT LONG TERM GOAL #2   Title Patient will be able to perform single leg squat to floor with Rt knee, minimal knee valgus, and pain 0/10   Baseline 1/31- still about halfway down before demonstrating poor muscle control    Time 6   Period Weeks   Status On-going   PT LONG TERM GOAL #3   Title Patient will be able to perform single leg hops of equal distance with bilateral lower extremities and pain 0/10 R knee    Baseline 1/30- 37.5 L, 33.5 R    Time 6   Period Weeks   Status On-going   PT LONG TERM GOAL #4   Title Patient to be able to jump off of at least 14 inch box and land on bilateral feet with good technique, good knee positioning with minimal knee valgus, and pain R knee 0/10   Time 6   Period Weeks   Status Achieved   PT LONG TERM GOAL #5   Title Patient to be able to maintain single leg stance on BOSU for at least 30 seconds with mild to moderate perturbations, pain R knee 0/10   Baseline 1/30- difficulty maintaining SLS with perturbations    Time 6   Period Weeks   Status On-going   PT LONG TERM GOAL #6   Title Patient will demonstrate the ability to safely ascend and descend full height  ladder and crawl around obstacles with pain R knee 0/10   Baseline 1/30- able to crawl, did not test ladder    PT LONG TERM GOAL #7   Title Pt will ambulate on uneven surface carrying 50 lbs in backpack  to simulate [employer] training exercise.    Baseline 1/30- has been walking inside with weigth up to 38.6 pounds    Time 6   Period Weeks   Status On-going               Plan - 05/12/15 0910    Clinical Impression Statement One max rep tested today.  Hamstring is still aroung 84% but quadricep has improved to 77%.  Pt continues to lack terminal extension.  Pt completed and instructed in terminal extension in both supine and standing.  Instructed to do at least 20 of each everyday.  Pt instructed in long sitting hamstring stretch.  Pt may benefit from completing exercises with Rt LE only rather than doing activites with backpack on.     Rehab Potential Excellent   PT Frequency 2x / week   PT Duration 4 weeks   PT Treatment/Interventions ADLs/Self Care Home Management;Cryotherapy;Electrical Stimulation;DME Instruction;Gait training;Stair training;Functional mobility training;Therapeutic activities;Therapeutic exercise;Balance training;Neuromuscular re-education;Patient/family education;Manual techniques;Passive range of motion   PT Next Visit Plan Work on single leg activites.  May do so without back pack.         Problem List There are no active problems to display for this patient.  Rayetta Humphrey, PT CLT [phone] 05/12/2015, 9:30 AM  [place], [place], [address] Phone: [phone]   Fax:  [phone]  Name: Tyshia Fenter MRN: [id number] Date of Birth: 08/02/1990

## 2015-05-17 ENCOUNTER — Ambulatory Visit (HOSPITAL_COMMUNITY): Payer: Federal, State, Local not specified - PPO

## 2015-05-17 ENCOUNTER — Encounter (HOSPITAL_COMMUNITY): Payer: Self-pay

## 2015-05-17 DIAGNOSIS — R2681 Unsteadiness on feet: Secondary | ICD-10-CM

## 2015-05-17 DIAGNOSIS — R269 Unspecified abnormalities of gait and mobility: Secondary | ICD-10-CM

## 2015-05-17 DIAGNOSIS — R262 Difficulty in walking, not elsewhere classified: Secondary | ICD-10-CM

## 2015-05-17 DIAGNOSIS — Z9889 Other specified postprocedural states: Secondary | ICD-10-CM

## 2015-05-17 DIAGNOSIS — M25561 Pain in right knee: Secondary | ICD-10-CM

## 2015-05-17 DIAGNOSIS — M25661 Stiffness of right knee, not elsewhere classified: Secondary | ICD-10-CM

## 2015-05-17 DIAGNOSIS — R600 Localized edema: Secondary | ICD-10-CM

## 2015-05-17 NOTE — Therapy (Signed)
[place], [place], [address] Phone: [phone]   Fax:  [phone]  Physical Therapy Treatment  Patient Details  Name: Ariel Parker MRN: [id number] Date of Birth: [birth year] Referring Provider: Dr. Maretta Los  Encounter Date: 05/17/2015      PT End of Session - 05/17/15 0946    Visit Number 55   Number of Visits 34   Date for PT Re-Evaluation 05/24/15   Authorization Type BCBS/ [employer] secondary (PROGRESS NOTE MUST BE DONE WEEKLY FOR MILITARY, give to patient each Friday)   Authorization Time Period 69/6/29-07/26/82; recert done 1/32   PT Start Time 0850   PT Stop Time 0930   PT Time Calculation (min) 40 min   Equipment Utilized During Treatment Other (comment)  weighted backpack with ther ex    Activity Tolerance Patient tolerated treatment well;No increased pain   Behavior During Therapy [facility] for tasks assessed/performed      Past Medical History  Diagnosis Date  . Disruption of anterior cruciate ligament of right knee 09/2014    Past Surgical History  Procedure Laterality Date  . Wisdom tooth extraction    . Knee arthroscopy w/ acl reconstruction Left 07/17/2006  . Knee arthroscopy with anterior cruciate ligament (acl) repair with hamstring graft Right 10/14/2014    Procedure: RIGHT KNEE ARTHROSCOPY WITH ANTERIOR CRUCIATE LIGAMENT (ACL) REPAIR;  Surgeon: Kathryne Hitch, MD;  Location: [place];  Service: Orthopedics;  Laterality: Right;  . Knee arthroscopy with lateral menisectomy Right 10/14/2014    Procedure: KNEE ARTHROSCOPY WITH LATERAL MENISECTOMY;  Surgeon: Kathryne Hitch, MD;  Location: [place];  Service: Orthopedics;  Laterality: Right;    There were no vitals filed for this visit.  Visit Diagnosis:  Unsteadiness  Difficulty walking  S/P ACL repair  Abnormality of gait  Right knee pain  Localized edema  Knee stiffness, right      Subjective Assessment  - 05/17/15 0855    Subjective Pt noted that her R knee was sore and slightly swollen this past weekend, which fully subsided after one day. Pt denied pain upon arrival and noted " I'm doing pretty good". Pt reports continued difficulty with running due to anterior R knee soreness.    Pertinent History Patient was playing soccer, jumped up and landed and heard a pop. ACL surgery was the 21st of July. Things at home have been going OK but at one point MD thought there might have been a blood clot, which did come back negative. Was taken off of CPM due to blood clot concern and to her knowledge is not going to be put back on CPM.    How long can you sit comfortably? no limitations   How long can you stand comfortably? 4+ hours   How long can you walk comfortably? 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 No/denies   Pain Score 0-No pain  R knee pain has ranged between a 0-3/10 on a VAS since last PT visit   Pain Location Knee   Pain Orientation Right                         [facility] Adult PT Treatment/Exercise - 05/17/15 0001    Knee/Hip Exercises: Stretches   Passive Hamstring Stretch Right;4 reps;30 seconds   Quad Stretch Right;30 seconds;4 reps  Quad Stretch Limitations in thomas position with strap    Hip Flexor Stretch Right;4 reps;30 seconds   Hip Flexor Stretch Limitations in thomas position with strap    Knee/Hip Exercises: Standing   Terminal Knee Extension Strengthening;Right;20 reps   Theraband Level (Terminal Knee Extension) Level 2 (Red);Level 3 (Green)  15 reps, each band    Functional Squat 1 set;15 reps;Other (comment)   Functional Squat Limitations R single leg squat with minor UE support   focus on R knee alignment and to avoid valgus stress    Wall Squat 2 sets;10 reps;5 seconds   Wall Squat Limitations double leg squat with focus on eccentric  control    SLS single leg balance on level ground x 3 sets with added ball toss in all directions   Knee/Hip Exercises: Supine   Terminal Knee Extension Strengthening;Right;15 reps;AROM;Other (comment)  s/p manual therapy    Manual Therapy   Manual Therapy Soft tissue mobilization;Joint mobilization;Passive ROM   Manual therapy comments performed separtely from other skilled interventions    Joint Mobilization R patellar mobs in S<>I direction using grades II-IV to improve extension and flexion; R posterior fibular head mob using grades II-IV to improve R TKE   Soft tissue mobilization cross-friction massage R patellar tendon x 1 minute; STM to R quad with rolling stick    Passive ROM R knee PROM into flexion and extension after jt mobs completed                 PT Education - 05/17/15 0944    Education provided Yes   Education Details Educated pt on proper R knee alignment with CKC ther ex, importance of improving R knee stability prior to running, importance of dynamic stretching/warm-up before running, and cross friction technique and parameters to R patellar tendon   Person(s) Educated Patient   Methods Explanation;Demonstration;Verbal cues;Tactile cues   Comprehension Verbalized understanding;Need further instruction;Returned demonstration;Tactile cues required          PT Short Term Goals - 04/26/15 0944    PT SHORT TERM GOAL #1   Title Patient will demonstrate R knee ROM of 0-120 degrees with no pain   Baseline 1/4- 0 to 126   Time 3   Period Weeks   Status Achieved   PT SHORT TERM GOAL #2   Title Patient will demonstrate at least 4-/5 strength in R knee and at least 4+/5 strength in L LE    Time 3   Period Weeks   Status Achieved   PT SHORT TERM GOAL #3   Title Patient to be ambulating unlimited distances without crutches and WBAT R LE, equal step lengths, equal weight bearing each side, minimal unsteadiness, pain no more than 1/10   Baseline Met with some  partial swelling thereafter, but tolerated well.    Time 3   Period Weeks   Status Achieved   PT SHORT TERM GOAL #4   Title Patient will experience 0/10 pain R knee  with all functional weight bearing tasks and exercises    Time 3   Period Weeks   Status Achieved   PT SHORT TERM GOAL #5   Title Patient to be independent in consistently and correctly performing  appropriate HEP, to be updated PRN    Baseline 08/29/216:  Reports compliance daily   Time 3   Period Weeks   Status Achieved           PT Long Term Goals - 04/26/15 5625  PT LONG TERM GOAL #1   Title Patient to have 0 degrees extension  and 130 degrees flexion R knee, pain 0/10    Time 6   Period Weeks   Status On-going   PT LONG TERM GOAL #2   Title Patient will be able to perform single leg squat to floor with R knee, minimal knee valgus, and pain 0/10   Baseline 1/31- still about halfway down before demonstrating poor muscle control    Time 6   Period Weeks   Status On-going   PT LONG TERM GOAL #3   Title Patient will be able to perform single leg hops of equal distance with bilateral lower extremities and pain 0/10 R knee    Baseline 1/30- 37.5 L, 33.5 R    Time 6   Period Weeks   Status On-going   PT LONG TERM GOAL #4   Title Patient to be able to jump off of at least 14 inch box and land on bilateral feet with good technique, good knee positioning with minimal knee valgus, and pain R knee 0/10   Time 6   Period Weeks   Status Achieved   PT LONG TERM GOAL #5   Title Patient to be able to maintain single leg stance on BOSU for at least 30 seconds with mild to moderate perturbations, pain R knee 0/10   Baseline 1/30- difficulty maintaining SLS with perturbations    Time 6   Period Weeks   Status On-going   PT LONG TERM GOAL #6   Title Patient will demonstrate the ability to safely ascend and descend full height ladder and crawl around obstacles with pain R knee 0/10   Baseline 1/30- able to crawl, did  not test ladder    PT LONG TERM GOAL #7   Title Pt will ambulate on uneven surface carrying 50 lbs in backpack to simulate [employer] training exercise.    Baseline 1/30- has been walking inside with weigth up to 38.6 pounds    Time 6   Period Weeks   Status On-going               Plan - 05/17/15 9798    Clinical Impression Statement PT tx was focused on manual therapy techniques to improve R knee extension ROM, LE stretches, functional quad/HS strengthening, and SLS activity to improve stabilization. Initiated PT tx with quad/hip flexor stretch in [place] position and passive HS stretch. Progressed to manual therapy techniques to improve R TKE with addition of posterior fibular head mob. Good tolerance reported with added joint mob with R TKE improving by 2 degrees. Pt presented with unstable R knee during SLS and single leg activities with verbal, visual, and tactile cues required to avoid R genu valgus stress. Improved R knee alignment and stability assessed once cues provided. Resistance with squats was decreased in order to focus on proper R knee alignment. Pt reported minor fatigue of the R quad with occasional rest breaks required. Focused on eccentric control with double leg squats with fair technique demo. Pt remained pain-free t/o today's PT tx. Pt would benefit from continued skilled PT to further progress with quad/HS strength, R knee ROM/jt mobility, and return to recreational activities.    Pt will benefit from skilled therapeutic intervention in order to improve on the following deficits Abnormal gait;Decreased coordination;Decreased range of motion;Difficulty walking;Impaired tone;Decreased safety awareness;Decreased activity tolerance;Pain;Decreased balance;Impaired flexibility;Improper body mechanics;Decreased mobility;Decreased strength;Increased edema;Postural dysfunction   Rehab Potential Excellent   PT Frequency 2x /  week   PT Duration 4 weeks   PT Treatment/Interventions  ADLs/Self Care Home Management;Cryotherapy;Electrical Stimulation;DME Instruction;Gait training;Stair training;Functional mobility training;Therapeutic activities;Therapeutic exercise;Balance training;Neuromuscular re-education;Patient/family education;Manual techniques;Passive range of motion   PT Next Visit Plan Continue with manual therapy techniques to improve R knee TKE, SLS activities to improve stabilization, functional strengthening to improve quad/HS strength, and pt education on proper knee alignment with CKC ther ex    PT Home Exercise Plan No changes this session    Consulted and Agree with Plan of Care Patient        Problem List There are no active problems to display for this patient.   Garen Lah, PT, DPT  05/17/2015, 9:58 AM  [place] [address] [place], [place], [address] Phone: [phone]   Fax:  [phone]  Name: Ariel Parker MRN: [id number] Date of Birth: 1991/02/14

## 2015-05-18 ENCOUNTER — Ambulatory Visit (HOSPITAL_COMMUNITY): Payer: Federal, State, Local not specified - PPO

## 2015-05-18 DIAGNOSIS — R269 Unspecified abnormalities of gait and mobility: Secondary | ICD-10-CM

## 2015-05-18 DIAGNOSIS — M25561 Pain in right knee: Secondary | ICD-10-CM

## 2015-05-18 DIAGNOSIS — R2681 Unsteadiness on feet: Secondary | ICD-10-CM | POA: Diagnosis not present

## 2015-05-18 DIAGNOSIS — R262 Difficulty in walking, not elsewhere classified: Secondary | ICD-10-CM

## 2015-05-18 DIAGNOSIS — R600 Localized edema: Secondary | ICD-10-CM

## 2015-05-18 DIAGNOSIS — M25661 Stiffness of right knee, not elsewhere classified: Secondary | ICD-10-CM

## 2015-05-18 DIAGNOSIS — Z9889 Other specified postprocedural states: Secondary | ICD-10-CM

## 2015-05-18 NOTE — Therapy (Signed)
[place], [place], [address] Phone: [phone]   Fax:  [phone]  Physical Therapy Treatment  Patient Details  Name: Ariel Parker MRN: [id number] Date of Birth: 14-Oct-1990 Referring Provider: Dr. Maretta Los  Encounter Date: 05/18/2015      PT End of Session - 05/18/15 0855    Visit Number 59   Number of Visits 16   Date for PT Re-Evaluation 05/24/15   Authorization Type BCBS/ [employer] secondary (PROGRESS NOTE MUST BE DONE WEEKLY FOR MILITARY, give to patient each Friday)   Authorization Time Period 57/2/62-0/3/55; recert done 9/74   PT Start Time 0845   PT Stop Time 0931   PT Time Calculation (min) 46 min   Activity Tolerance Patient tolerated treatment well   Behavior During Therapy [facility] for tasks assessed/performed      Past Medical History  Diagnosis Date  . Disruption of anterior cruciate ligament of right knee 09/2014    Past Surgical History  Procedure Laterality Date  . Wisdom tooth extraction    . Knee arthroscopy w/ acl reconstruction Left 07/17/2006  . Knee arthroscopy with anterior cruciate ligament (acl) repair with hamstring graft Right 10/14/2014    Procedure: RIGHT KNEE ARTHROSCOPY WITH ANTERIOR CRUCIATE LIGAMENT (ACL) REPAIR;  Surgeon: Kathryne Hitch, MD;  Location: [place];  Service: Orthopedics;  Laterality: Right;  . Knee arthroscopy with lateral menisectomy Right 10/14/2014    Procedure: KNEE ARTHROSCOPY WITH LATERAL MENISECTOMY;  Surgeon: Kathryne Hitch, MD;  Location: [place];  Service: Orthopedics;  Laterality: Right;    There were no vitals filed for this visit.  Visit Diagnosis:  Unsteadiness  Difficulty walking  S/P ACL repair  Abnormality of gait  Right knee pain  Localized edema  Knee stiffness, right      Subjective Assessment - 05/18/15 0852    Subjective Pt stated Rt knee was sore this morning, no real pain.     Pertinent  History Patient was playing soccer, jumped up and landed and heard a pop. ACL surgery was the 21st of July. Things at home have been going OK but at one point MD thought there might have been a blood clot, which did come back negative. Was taken off of CPM due to blood clot concern and to her knowledge is not going to be put back on CPM.    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 No/denies            [facility] Adult PT Treatment/Exercise - 05/18/15 0001    Knee/Hip Exercises: Machines for Strengthening   Cybex Knee Extension 2 sets 10x 5.5 Pl then 4.5 Pl   Knee/Hip Exercises: Standing   Terminal Knee Extension Strengthening;Right;20 reps   Theraband Level (Terminal Knee Extension) Level 2 (Red);Level 3 (Green)   Functional Squat 2 sets;10 reps;10 seconds   Functional Squat Limitations R single leg squat with minor UE support    Wall Squat 2 sets;10 reps;5 seconds   Wall Squat Limitations Rt LE only   SLS single leg balance on BOSU with min and mod pertubations; SLS on BOSU toss wiht weighted ball toss   Cryotherapy   Number Minutes Cryotherapy 5 Minutes   Cryotherapy Location Knee   Type of Cryotherapy Ice massage   Manual Therapy   Manual Therapy Soft tissue mobilization;Joint mobilization;Passive ROM   Manual therapy comments performed separtely  from other skilled interventions    Soft tissue mobilization cross-friction massage R patellar tendon x 1 minute; STM to R quad with rolling stick                 PT Education - 05/17/15 0944    Education provided Yes   Education Details Educated pt on proper R knee alignment with CKC ther ex, importance of improving R knee stability prior to running, importance of dynamic stretching/warm-up before running, and cross friction technique and parameters to R patellar tendon   Person(s) Educated Patient   Methods  Explanation;Demonstration;Verbal cues;Tactile cues   Comprehension Verbalized understanding;Need further instruction;Returned demonstration;Tactile cues required          PT Short Term Goals - 04/26/15 0944    PT SHORT TERM GOAL #1   Title Patient will demonstrate R knee ROM of 0-120 degrees with no pain   Baseline 1/4- 0 to 126   Time 3   Period Weeks   Status Achieved   PT SHORT TERM GOAL #2   Title Patient will demonstrate at least 4-/5 strength in R knee and at least 4+/5 strength in L LE    Time 3   Period Weeks   Status Achieved   PT SHORT TERM GOAL #3   Title Patient to be ambulating unlimited distances without crutches and WBAT R LE, equal step lengths, equal weight bearing each side, minimal unsteadiness, pain no more than 1/10   Baseline Met with some partial swelling thereafter, but tolerated well.    Time 3   Period Weeks   Status Achieved   PT SHORT TERM GOAL #4   Title Patient will experience 0/10 pain R knee  with all functional weight bearing tasks and exercises    Time 3   Period Weeks   Status Achieved   PT SHORT TERM GOAL #5   Title Patient to be independent in consistently and correctly performing  appropriate HEP, to be updated PRN    Baseline 08/29/216:  Reports compliance daily   Time 3   Period Weeks   Status Achieved           PT Long Term Goals - 04/26/15 1607    PT LONG TERM GOAL #1   Title Patient to have 0 degrees extension  and 130 degrees flexion R knee, pain 0/10    Time 6   Period Weeks   Status On-going   PT LONG TERM GOAL #2   Title Patient will be able to perform single leg squat to floor with R knee, minimal knee valgus, and pain 0/10   Baseline 1/31- still about halfway down before demonstrating poor muscle control    Time 6   Period Weeks   Status On-going   PT LONG TERM GOAL #3   Title Patient will be able to perform single leg hops of equal distance with bilateral lower extremities and pain 0/10 R knee    Baseline 1/30-  37.5 L, 33.5 R    Time 6   Period Weeks   Status On-going   PT LONG TERM GOAL #4   Title Patient to be able to jump off of at least 14 inch box and land on bilateral feet with good technique, good knee positioning with minimal knee valgus, and pain R knee 0/10   Time 6   Period Weeks   Status Achieved   PT LONG TERM GOAL #5   Title Patient to be able to maintain single leg  stance on BOSU for at least 30 seconds with mild to moderate perturbations, pain R knee 0/10   Baseline 1/30- difficulty maintaining SLS with perturbations    Time 6   Period Weeks   Status On-going   PT LONG TERM GOAL #6   Title Patient will demonstrate the ability to safely ascend and descend full height ladder and crawl around obstacles with pain R knee 0/10   Baseline 1/30- able to crawl, did not test ladder    PT LONG TERM GOAL #7   Title Pt will ambulate on uneven surface carrying 50 lbs in backpack to simulate [employer] training exercise.    Baseline 1/30- has been walking inside with weigth up to 38.6 pounds    Time 6   Period Weeks   Status On-going               Plan - 05/18/15 2119    Clinical Impression Statement Session focus on quadricep strengthening with SLS based activities to improve stabilization.  Pt demonstrated improved knee extension this session with no reports of pain with all TKE actviities.  Progressed SLS throwing technqiues on BOSU for dynamic surface to increased knee stability.  Minimal therapist facilitation required for proper knee alignment with squats this session.  Ended session with manual cross friction to patella tendon followed by ice massage to reduce stress and prevent pain following activities.  No reports of pain through sessoin, continues to demonstrate visible musculature fatigue with activities due to quadricep weakness.   Pt will benefit from skilled therapeutic intervention in order to improve on the following deficits Abnormal gait;Decreased coordination;Decreased  range of motion;Difficulty walking;Impaired tone;Decreased safety awareness;Decreased activity tolerance;Pain;Decreased balance;Impaired flexibility;Improper body mechanics;Decreased mobility;Decreased strength;Increased edema;Postural dysfunction   Rehab Potential Excellent   PT Frequency 2x / week   PT Duration 4 weeks   PT Treatment/Interventions ADLs/Self Care Home Management;Cryotherapy;Electrical Stimulation;DME Instruction;Gait training;Stair training;Functional mobility training;Therapeutic activities;Therapeutic exercise;Balance training;Neuromuscular re-education;Patient/family education;Manual techniques;Passive range of motion   PT Next Visit Plan Reassess next session, Continue to focus on quad strenghtening with SLS activities to improve stabilizaiton, functional strenghtening, education on proper knee alignment with CKC therex ex and manual PRN.   PT Home Exercise Plan No changes this session         Problem List There are no active problems to display for this patient.  [address], LPTA; [place]  Aldona Lento 05/18/2015, 11:21 AM  [place], [place], [address] Phone: [phone]   Fax:  [phone]  Name: Ariel Parker MRN: [id number] Date of Birth: January 21, 1991

## 2015-05-19 ENCOUNTER — Encounter (HOSPITAL_COMMUNITY): Admitting: Physical Therapy

## 2015-05-24 ENCOUNTER — Encounter (HOSPITAL_COMMUNITY)

## 2015-05-25 ENCOUNTER — Ambulatory Visit (HOSPITAL_COMMUNITY): Payer: Federal, State, Local not specified - PPO | Attending: Orthopedic Surgery | Admitting: Physical Therapy

## 2015-05-25 DIAGNOSIS — Z9889 Other specified postprocedural states: Secondary | ICD-10-CM | POA: Insufficient documentation

## 2015-05-25 DIAGNOSIS — M25561 Pain in right knee: Secondary | ICD-10-CM

## 2015-05-25 DIAGNOSIS — M25661 Stiffness of right knee, not elsewhere classified: Secondary | ICD-10-CM

## 2015-05-25 DIAGNOSIS — M6281 Muscle weakness (generalized): Secondary | ICD-10-CM | POA: Diagnosis present

## 2015-05-25 DIAGNOSIS — R262 Difficulty in walking, not elsewhere classified: Secondary | ICD-10-CM | POA: Insufficient documentation

## 2015-05-25 DIAGNOSIS — R2681 Unsteadiness on feet: Secondary | ICD-10-CM | POA: Diagnosis present

## 2015-05-25 DIAGNOSIS — R600 Localized edema: Secondary | ICD-10-CM | POA: Diagnosis present

## 2015-05-25 DIAGNOSIS — R269 Unspecified abnormalities of gait and mobility: Secondary | ICD-10-CM | POA: Insufficient documentation

## 2015-05-25 DIAGNOSIS — R531 Weakness: Secondary | ICD-10-CM

## 2015-05-25 NOTE — Therapy (Signed)
[facility] [address] [place], [place], [address] Phone: [phone]   Fax:  [phone]  Physical Therapy Treatment/Re-assessment  Patient Details  Name: Ariel Parker MRN: [id number] Date of Birth: [birth year] Referring Provider: Dr. Orie Fisherman  Encounter Date: 05/25/2015      PT End of Session - 05/25/15 1256    Visit Number 59   Number of Visits 77   Date for PT Re-Evaluation 07/20/15   Authorization Type BCBS/ [employer] secondary (PROGRESS NOTE MUST BE DONE WEEKLY FOR MILITARY, give to patient each Friday)   Authorization Time Period 12/26/14-04/29/15; recert done 1/31, recert done 05/25/15.   PT Start Time 0848   PT Stop Time 0931   PT Time Calculation (min) 43 min   Activity Tolerance Patient tolerated treatment well   Behavior During Therapy [facility] for tasks assessed/performed      Past Medical History  Diagnosis Date  . Disruption of anterior cruciate ligament of right knee 09/2014    Past Surgical History  Procedure Laterality Date  . Wisdom tooth extraction    . Knee arthroscopy w/ acl reconstruction Left 07/17/2006  . Knee arthroscopy with anterior cruciate ligament (acl) repair with hamstring graft Right 10/14/2014    Procedure: RIGHT KNEE ARTHROSCOPY WITH ANTERIOR CRUCIATE LIGAMENT (ACL) REPAIR;  Surgeon: Mckinley Jewel, MD;  Location: [facility];  Service: Orthopedics;  Laterality: Right;  . Knee arthroscopy with lateral menisectomy Right 10/14/2014    Procedure: KNEE ARTHROSCOPY WITH LATERAL MENISECTOMY;  Surgeon: Mckinley Jewel, MD;  Location: [facility];  Service: Orthopedics;  Laterality: Right;    There were no vitals filed for this visit.  Visit Diagnosis:  S/P ACL repair - Plan: PT plan of care cert/re-cert  Right knee pain - Plan: PT plan of care cert/re-cert  Knee stiffness, right - Plan: PT plan of care cert/re-cert  Unsteadiness - Plan: PT plan of care cert/re-cert  Decreased  strength - Plan: PT plan of care cert/re-cert      Subjective Assessment - 05/25/15 0850    Subjective Doing okay, knee gets painful at times but having some today. Denies any injury, seems like it is from doing exercise at home (SAQ). Going back to the doctor 06/28/15.   Currently in Pain? Yes   Pain Score 1    Pain Location Knee   Pain Orientation Right   Pain Descriptors / Indicators Aching  annoying   Aggravating Factors  certain exercise (SAQ)   Pain Relieving Factors ice, heat            [facility] PT Assessment - 05/25/15 0001    AROM   Right Knee Extension 0   Right Knee Flexion 131                     [facility] Adult PT Treatment/Exercise - 05/25/15 0001    Ambulation/Gait   Gait Comments uncompensated gait patter. No weigth application today.    Knee/Hip Exercises: Aerobic   Stationary Bike 6 min L 6 (matrix)   Knee/Hip Exercises: Plyometrics   Unilateral Jumping Limitations single leg jump, Rt 91.5 cm, Lt 94 cm   Knee/Hip Exercises: Standing   Functional Squat 2 sets;10 reps   Functional Squat Limitations 1X10 with shift to Rt, 1X10 with shift and increased speed.    Other Standing Knee Exercises bosu all SLS X 5 attempts, able to hold 30 seconds but noted instability.    Other Standing Knee Exercises single leg squat, X3.  Approximately 1/2 distance.                 PT Education - 05/25/15 1255    Education provided Yes   Education Details Reviewed all Goals and discussed current progress. Discussed squat with weight shift to rt with slow and quick pacing.    Person(s) Educated Patient   Methods Explanation;Demonstration;Tactile cues;Verbal cues   Comprehension Verbalized understanding;Returned demonstration          PT Short Term Goals - 05/25/15 1249    PT SHORT TERM GOAL #1   Title Patient will demonstrate R knee ROM of 0-120 degrees with no pain   Baseline 05/25/15 0-131   Status Achieved   PT SHORT TERM GOAL #2   Title Patient will  demonstrate at least 4-/5 strength in R knee and at least 4+/5 strength in L LE    Status Achieved   PT SHORT TERM GOAL #3   Title Patient to be ambulating unlimited distances without crutches and WBAT R LE, equal step lengths, equal weight bearing each side, minimal unsteadiness, pain no more than 1/10   Status Achieved   PT SHORT TERM GOAL #4   Title Patient will experience 0/10 pain R knee  with all functional weight bearing tasks and exercises    Status Achieved   PT SHORT TERM GOAL #5   Title Patient to be independent in consistently and correctly performing  appropriate HEP, to be updated PRN    Status Achieved           PT Long Term Goals - 05/25/15 1250    PT LONG TERM GOAL #1   Title Patient to have 0 degrees extension  and 130 degrees flexion R knee, pain 0/10    Baseline 05/25/15,  0-131   Status Achieved   PT LONG TERM GOAL #2   Title Patient will be able to perform single leg squat to floor with R knee, minimal knee valgus, and pain 0/10   Baseline 05/25/15 patient able to perform to approximately 1/2 distance.    Time 6   Period Weeks   Status On-going   PT LONG TERM GOAL #3   Title Patient will be able to perform single leg hops of equal distance with bilateral lower extremities and pain 0/10 R knee    Baseline 05/25/15. Rt 92 cm, Lt 94 cm.    Time 6   Status On-going   PT LONG TERM GOAL #4   Title Patient to be able to jump off of at least 14 inch box and land on bilateral feet with good technique, good knee positioning with minimal knee valgus, and pain R knee 0/10   Status Achieved   PT LONG TERM GOAL #5   Title Patient to be able to maintain single leg stance on BOSU for at least 30 seconds with mild to moderate perturbations, pain R knee 0/10   Baseline 05/25/15. Able to hold for 30 seconds with poor stability.    Period Weeks   Status On-going   PT LONG TERM GOAL #6   Title Patient will demonstrate the ability to safely ascend and descend full height ladder and  crawl around obstacles with pain R knee 0/10   Baseline not tested   Time 6   Period Weeks   Status On-going   PT LONG TERM GOAL #7   Title Pt will ambulate on uneven surface carrying 50 lbs in backpack to simulate [employer] training exercise.    Baseline  Using between 31-38 pounds currently. 05/25/15   Time 6   Period Weeks   Status On-going               Plan - 05/25/15 1259    Clinical Impression Statement Session consisted of functional activity and review of goals and a review of current progress. At this time the patient is making continued gains with ROM, strenght and overall function. Although she is progressing, deficits are remaining in regard to higher level tasks such as balance and isolated Rt LE strength. The patient has improved in her single leg hop from 33.5 cm to 92cm and is nearing equality with the LLE. Functionally she reports difficulty with running and single leg tasks and has been experiencing some anterior knee pain at times. Overall the patient is making progress and would continue to benefit from  the continuation of PT services.    Pt will benefit from skilled therapeutic intervention in order to improve on the following deficits Abnormal gait;Decreased coordination;Decreased range of motion;Difficulty walking;Impaired tone;Decreased safety awareness;Decreased activity tolerance;Pain;Decreased balance;Impaired flexibility;Improper body mechanics;Decreased mobility;Decreased strength;Increased edema;Postural dysfunction   Rehab Potential Excellent   PT Frequency 2x / week   PT Duration 4 weeks   PT Treatment/Interventions ADLs/Self Care Home Management;Cryotherapy;Electrical Stimulation;DME Instruction;Gait training;Stair training;Functional mobility training;Therapeutic activities;Therapeutic exercise;Balance training;Neuromuscular re-education;Patient/family education;Manual techniques;Passive range of motion   PT Next Visit Plan Focus on quad strength and  balance. May incorporate some increased speed with exercises for dynamic progression. Monitor anterior knee pain.    PT Home Exercise Plan No changes this session    Consulted and Agree with Plan of Care Patient        Problem List There are no active problems to display for this patient.   Christiane Ha, PT, CSCS Pager [phone]  05/25/2015, 2:26 PM  [facility] [address] [place], [place], [address] Phone: [phone]   Fax:  [phone]  Name: Ariel Parker MRN: [id number] Date of Birth: 11-22-1990

## 2015-05-26 ENCOUNTER — Encounter (HOSPITAL_COMMUNITY)

## 2015-05-27 ENCOUNTER — Ambulatory Visit (HOSPITAL_COMMUNITY): Payer: Federal, State, Local not specified - PPO | Admitting: Physical Therapy

## 2015-05-27 DIAGNOSIS — M25661 Stiffness of right knee, not elsewhere classified: Secondary | ICD-10-CM

## 2015-05-27 DIAGNOSIS — Z9889 Other specified postprocedural states: Secondary | ICD-10-CM | POA: Diagnosis not present

## 2015-05-27 DIAGNOSIS — R2681 Unsteadiness on feet: Secondary | ICD-10-CM

## 2015-05-27 DIAGNOSIS — R531 Weakness: Secondary | ICD-10-CM

## 2015-05-27 DIAGNOSIS — M25561 Pain in right knee: Secondary | ICD-10-CM

## 2015-05-27 NOTE — Therapy (Signed)
[facility] [address] [place], [place], [address] Phone: [phone]   Fax:  [phone]  Physical Therapy Treatment  Patient Details  Name: Ariel Parker MRN: [id number] Date of Birth: Apr 03, 1990 Referring Provider: Dr. Orie Fisherman  Encounter Date: 05/27/2015      PT End of Session - 05/27/15 1030    Visit Number [id number]   Number of Visits 77   Date for PT Re-Evaluation 07/20/15   Authorization Type BCBS/ [employer] secondary (PROGRESS NOTE MUST BE DONE WEEKLY FOR MILITARY, give to patient each Friday)   Authorization Time Period 12/26/14-04/29/15; recert done 1/31, recert done 05/25/15.   PT Start Time [phone]   PT Stop Time 0929   PT Time Calculation (min) 40 min   Activity Tolerance Patient tolerated treatment well   Behavior During Therapy [facility] for tasks assessed/performed      Past Medical History  Diagnosis Date  . Disruption of anterior cruciate ligament of right knee 09/2014    Past Surgical History  Procedure Laterality Date  . Wisdom tooth extraction    . Knee arthroscopy w/ acl reconstruction Left 07/17/2006  . Knee arthroscopy with anterior cruciate ligament (acl) repair with hamstring graft Right 10/14/2014    Procedure: RIGHT KNEE ARTHROSCOPY WITH ANTERIOR CRUCIATE LIGAMENT (ACL) REPAIR;  Surgeon: Mckinley Jewel, MD;  Location: [facility];  Service: Orthopedics;  Laterality: Right;  . Knee arthroscopy with lateral menisectomy Right 10/14/2014    Procedure: KNEE ARTHROSCOPY WITH LATERAL MENISECTOMY;  Surgeon: Mckinley Jewel, MD;  Location:  SURGERY CENTER;  Service: Orthopedics;  Laterality: Right;    There were no vitals filed for this visit.  Visit Diagnosis:  S/P ACL repair  Right knee pain  Knee stiffness, right  Unsteadiness  Decreased strength      Subjective Assessment - 05/27/15 0851    Subjective Feeling good today, nothing new. Knee is doing better today, no pain.    Pertinent History  Patient was playing soccer, jumped up and landed and heard a pop. ACL surgery was the 21st of July. Things at home have been going OK but at one point MD thought there might have been a blood clot, which did come back negative. Was taken off of CPM due to blood clot concern and to her knowledge is not going to be put back on CPM.    Currently in Pain? No/denies                         [facility] Adult PT Treatment/Exercise - 05/27/15 0001    Knee/Hip Exercises: Stretches   Passive Hamstring Stretch Right;4 reps;30 seconds   Quad Stretch Right;30 seconds;4 reps   Knee/Hip Exercises: Aerobic   Tread Mill 7 minutes with at 3.0-3.5 mph, incline 1-6%. Wearing 31 lb pack.    Knee/Hip Exercises: Standing   Functional Squat 2 sets   Functional Squat Limitations squat shift right.   Other Standing Knee Exercises squat even weight 3 slow/49fast/3slow, 2 cycles.    Knee/Hip Exercises: Supine   Bridges Strengthening;Both;10 reps   Bridges Limitations Tball at ankles   Other Supine Knee/Hip Exercises T-ball bridge with hamstring curls 1X10   Knee/Hip Exercises: Sidelying   Hip ABduction Right;Strengthening;2 sets;10 reps   Hip ABduction Limitations cues for technique   Balance Poses   Warrior III Time X3                PT Education - 05/27/15 1029  Education provided Yes   Education Details reviewed and encouraged continuation of sidelying hip abduction   Person(s) Educated Patient   Methods Explanation;Demonstration;Tactile cues;Verbal cues   Comprehension Verbalized understanding;Returned demonstration          PT Short Term Goals - 05/25/15 1249    PT SHORT TERM GOAL #1   Title Patient will demonstrate R knee ROM of 0-120 degrees with no pain   Baseline 05/25/15 0-131   Status Achieved   PT SHORT TERM GOAL #2   Title Patient will demonstrate at least 4-/5 strength in R knee and at least 4+/5 strength in L LE    Status Achieved   PT SHORT TERM GOAL #3   Title  Patient to be ambulating unlimited distances without crutches and WBAT R LE, equal step lengths, equal weight bearing each side, minimal unsteadiness, pain no more than 1/10   Status Achieved   PT SHORT TERM GOAL #4   Title Patient will experience 0/10 pain R knee  with all functional weight bearing tasks and exercises    Status Achieved   PT SHORT TERM GOAL #5   Title Patient to be independent in consistently and correctly performing  appropriate HEP, to be updated PRN    Status Achieved           PT Long Term Goals - 05/25/15 1250    PT LONG TERM GOAL #1   Title Patient to have 0 degrees extension  and 130 degrees flexion R knee, pain 0/10    Baseline 05/25/15,  0-131   Status Achieved   PT LONG TERM GOAL #2   Title Patient will be able to perform single leg squat to floor with R knee, minimal knee valgus, and pain 0/10   Baseline 05/25/15 patient able to perform to approximately 1/2 distance.    Time 6   Period Weeks   Status On-going   PT LONG TERM GOAL #3   Title Patient will be able to perform single leg hops of equal distance with bilateral lower extremities and pain 0/10 R knee    Baseline 05/25/15. Rt 92 cm, Lt 94 cm.    Time 6   Status On-going   PT LONG TERM GOAL #4   Title Patient to be able to jump off of at least 14 inch box and land on bilateral feet with good technique, good knee positioning with minimal knee valgus, and pain R knee 0/10   Status Achieved   PT LONG TERM GOAL #5   Title Patient to be able to maintain single leg stance on BOSU for at least 30 seconds with mild to moderate perturbations, pain R knee 0/10   Baseline 05/25/15. Able to hold for 30 seconds with poor stability.    Period Weeks   Status On-going   PT LONG TERM GOAL #6   Title Patient will demonstrate the ability to safely ascend and descend full height ladder and crawl around obstacles with pain R knee 0/10   Baseline not tested   Time 6   Period Weeks   Status On-going   PT LONG TERM GOAL  #7   Title Pt will ambulate on uneven surface carrying 50 lbs in backpack to simulate [employer] training exercise.    Baseline Using between 31-38 pounds currently. 05/25/15   Time 6   Period Weeks   Status On-going               Plan - 05/27/15 1030  Clinical Impression Statement PT session focusing on strength/encurance/balance through the rt LE. Patient continue to have strength deficits with resulting balance and functional deficits. Noted decreased hip abductor strength today, reinforced need to continue to include in HEP for knee dynamic stability. Patient denied pain during and after session and declined using ice today. Will continue per POC.    PT Next Visit Plan Cont with strength, endurance, balance activities. Increased hip abductor strength for knee stability with dynamic activity. Monitor for anterior knee pain,.    PT Home Exercise Plan Check/progress hip abductor strength - possible standing side steps if appropriate.    Consulted and Agree with Plan of Care Patient        Problem List There are no active problems to display for this patient.   Christiane HaBenjamin J. Hayzlee Mcsorley, PT, CSCS Pager [phone] Office [phone]  05/27/2015, 10:35 AM  [facility] [address] [place], [place], [address] Phone: [phone]   Fax:  [phone]  Name: Hiram Gashrista Zbikowski MRN: [id number] Date of Birth: August 28, 1990

## 2015-05-31 ENCOUNTER — Ambulatory Visit (HOSPITAL_COMMUNITY): Payer: Federal, State, Local not specified - PPO

## 2015-05-31 ENCOUNTER — Encounter (HOSPITAL_COMMUNITY): Payer: Self-pay

## 2015-05-31 DIAGNOSIS — Z9889 Other specified postprocedural states: Secondary | ICD-10-CM | POA: Diagnosis not present

## 2015-05-31 DIAGNOSIS — R2681 Unsteadiness on feet: Secondary | ICD-10-CM

## 2015-05-31 DIAGNOSIS — R269 Unspecified abnormalities of gait and mobility: Secondary | ICD-10-CM

## 2015-05-31 DIAGNOSIS — M25661 Stiffness of right knee, not elsewhere classified: Secondary | ICD-10-CM

## 2015-05-31 DIAGNOSIS — R600 Localized edema: Secondary | ICD-10-CM

## 2015-05-31 DIAGNOSIS — M25561 Pain in right knee: Secondary | ICD-10-CM

## 2015-05-31 DIAGNOSIS — R531 Weakness: Secondary | ICD-10-CM

## 2015-05-31 DIAGNOSIS — R262 Difficulty in walking, not elsewhere classified: Secondary | ICD-10-CM

## 2015-05-31 NOTE — Therapy (Signed)
[facility] [address] [place], [place], [address] Phone: [phone]   Fax:  [phone]  Physical Therapy Treatment  Patient Details  Name: Ariel Parker MRN: [id number] Date of Birth: 17-Aug-1990 Referring Provider: Dr. Orie Fisherman  Encounter Date: 05/31/2015      PT End of Session - 05/31/15 0817    Visit Number 61   Number of Visits 77   Date for PT Re-Evaluation 07/20/15   Authorization Type BCBS/ [employer] secondary (PROGRESS NOTE MUST BE DONE WEEKLY FOR MILITARY, give to patient each Friday)   Authorization Time Period 12/26/14-04/29/15; recert done 1/31, recert done 05/25/15.   PT Start Time 0805   PT Stop Time 0900   PT Time Calculation (min) 55 min   Activity Tolerance Patient tolerated treatment well   Behavior During Therapy WFL for tasks assessed/performed      Past Medical History  Diagnosis Date  . Disruption of anterior cruciate ligament of right knee 09/2014    Past Surgical History  Procedure Laterality Date  . Wisdom tooth extraction    . Knee arthroscopy w/ acl reconstruction Left 07/17/2006  . Knee arthroscopy with anterior cruciate ligament (acl) repair with hamstring graft Right 10/14/2014    Procedure: RIGHT KNEE ARTHROSCOPY WITH ANTERIOR CRUCIATE LIGAMENT (ACL) REPAIR;  Surgeon: Mckinley Jewel, MD;  Location: [facility];  Service: Orthopedics;  Laterality: Right;  . Knee arthroscopy with lateral menisectomy Right 10/14/2014    Procedure: KNEE ARTHROSCOPY WITH LATERAL MENISECTOMY;  Surgeon: Mckinley Jewel, MD;  Location: [facility];  Service: Orthopedics;  Laterality: Right;    There were no vitals filed for this visit.  Visit Diagnosis:  S/P ACL repair  Right knee pain  Knee stiffness, right  Unsteadiness  Decreased strength  Difficulty walking  Abnormality of gait  Localized edema      Subjective Assessment - 05/31/15 0808    Subjective Pt denied pain upon arrival to PT  clinic and noted that her R knee is "feeling good". Pt further denied pain since her last PT visit. No changes or new complaints reported since last PT visit.    Pertinent History Patient was playing soccer, jumped up and landed and heard a pop. ACL surgery was the 21st of July. Things at home have been going OK but at one point MD thought there might have been a blood clot, which did come back negative. Was taken off of CPM due to blood clot concern and to her knowledge is not going to be put back on CPM.    How long can you sit comfortably? no limitations   How long can you stand comfortably? 4+ hours   How long can you walk comfortably? 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 No/denies                         [facility] Adult PT Treatment/Exercise - 05/31/15 0001    Knee/Hip Exercises: Stretches   Active Hamstring Stretch Right;Limitations;30 seconds;4 reps       Lobbyist Right;4 reps;30 seconds   Hip Flexor Stretch Right;4 reps;30 seconds   Hip Flexor Stretch Limitations in thomas position with strap    Knee/Hip Exercises: Aerobic   Elliptical 8 minute warm-up on level 3 to 6   Knee/Hip Exercises: Plyometrics   Bilateral Jumping 10 reps;Box Height:  6";Other (comment);2 sets  with focus on R knee alignment and technique    Other Plyometric Exercises Nordic HS eccentric control x 1 set of 7 reps; therapist stabilized lower leg at lower leg    Knee/Hip Exercises: Standing   Terminal Knee Extension Strengthening;20 reps;Right   Theraband Level (Terminal Knee Extension) Level 4 (Blue)   Functional Squat 3 sets;10 reps   Functional Squat Limitations holding orange medicine ball   SLS single leg balance on BOSU with min and mod pertubations; SLS on BOSU toss with weighted ball toss   Other Standing Knee Exercises Side steps with blue thera-band x 3 sets of 8 reps     Knee/Hip Exercises: Supine   Bridges Strengthening;2 sets;15 reps                PT Education - 05/31/15 0817    Education provided Yes   Education Details Current HEP with instructions on parameters and technique    Person(s) Educated Patient   Methods Explanation   Comprehension Verbalized understanding          PT Short Term Goals - 05/25/15 1249    PT SHORT TERM GOAL #1   Title Patient will demonstrate R knee ROM of 0-120 degrees with no pain   Baseline 05/25/15 0-131   Status Achieved   PT SHORT TERM GOAL #2   Title Patient will demonstrate at least 4-/5 strength in R knee and at least 4+/5 strength in L LE    Status Achieved   PT SHORT TERM GOAL #3   Title Patient to be ambulating unlimited distances without crutches and WBAT R LE, equal step lengths, equal weight bearing each side, minimal unsteadiness, pain no more than 1/10   Status Achieved   PT SHORT TERM GOAL #4   Title Patient will experience 0/10 pain R knee  with all functional weight bearing tasks and exercises    Status Achieved   PT SHORT TERM GOAL #5   Title Patient to be independent in consistently and correctly performing  appropriate HEP, to be updated PRN    Status Achieved           PT Long Term Goals - 05/25/15 1250    PT LONG TERM GOAL #1   Title Patient to have 0 degrees extension  and 130 degrees flexion R knee, pain 0/10    Baseline 05/25/15,  0-131   Status Achieved   PT LONG TERM GOAL #2   Title Patient will be able to perform single leg squat to floor with R knee, minimal knee valgus, and pain 0/10   Baseline 05/25/15 patient able to perform to approximately 1/2 distance.    Time 6   Period Weeks   Status On-going   PT LONG TERM GOAL #3   Title Patient will be able to perform single leg hops of equal distance with bilateral lower extremities and pain 0/10 R knee    Baseline 05/25/15. Rt 92 cm, Lt 94 cm.    Time 6   Status On-going   PT LONG TERM GOAL #4   Title Patient to be  able to jump off of at least 14 inch box and land on bilateral feet with good technique, good knee positioning with minimal knee valgus, and pain R knee 0/10   Status Achieved   PT LONG TERM GOAL #5   Title Patient to be able to maintain single leg stance on BOSU for at least 30 seconds with mild to moderate  perturbations, pain R knee 0/10   Baseline 05/25/15. Able to hold for 30 seconds with poor stability.    Period Weeks   Status On-going   PT LONG TERM GOAL #6   Title Patient will demonstrate the ability to safely ascend and descend full height ladder and crawl around obstacles with pain R knee 0/10   Baseline not tested   Time 6   Period Weeks   Status On-going   PT LONG TERM GOAL #7   Title Pt will ambulate on uneven surface carrying 50 lbs in backpack to simulate [employer] training exercise.    Baseline Using between 31-38 pounds currently. 05/25/15   Time 6   Period Weeks   Status On-going               Plan - 05/31/15 0818    Clinical Impression Statement PT session focused on quad/HS CKC strengthening, plyometric training, LE stretches, and R SLS stabilization ther act. Pt tolerated all ther ex without exacerbating pain. Verbal cues required for proper R knee alignment with squats, plyo jumps, and SLS activities. Improved technique demo with subsequent reps. Added Nordic HS eccentric ther ex to regimen in order to improve HS strength with good tolerance reported.  R knee AROM measured 0-134 degrees. R knee pain remained at 0/10 on a VAS at the completion of PT visit. Continue with current POC and progress per post-op protocol.    Pt will benefit from skilled therapeutic intervention in order to improve on the following deficits Abnormal gait;Decreased coordination;Decreased range of motion;Difficulty walking;Impaired tone;Decreased safety awareness;Decreased activity tolerance;Pain;Decreased balance;Impaired flexibility;Improper body mechanics;Decreased mobility;Decreased  strength;Increased edema;Postural dysfunction   Rehab Potential Excellent   PT Frequency 2x / week   PT Duration 4 weeks   PT Treatment/Interventions ADLs/Self Care Home Management;Cryotherapy;Electrical Stimulation;DME Instruction;Gait training;Stair training;Functional mobility training;Therapeutic activities;Therapeutic exercise;Balance training;Neuromuscular re-education;Patient/family education;Manual techniques;Passive range of motion   PT Next Visit Plan Cont with strength, endurance, balance activities. Increased hip abductor strength for knee stability with dynamic activity. Monitor for anterior knee pain,.    PT Home Exercise Plan Continue per POC with focus on CKC quad/HS strengthening, R SLS stabilization activities, and manual techniques to improve R knee jt mobility   Consulted and Agree with Plan of Care Patient        Problem List There are no active problems to display for this patient.   Bonnee QuinGabe Charleston Vierling, PT, DPT  05/31/2015, 9:03 AM   [facility] [address] [place], [place], [address] Phone: [phone]   Fax:  [phone]  Name: Ariel Parker MRN: [id number] Date of Birth: 22-Jul-1990

## 2015-06-01 ENCOUNTER — Encounter (HOSPITAL_COMMUNITY): Admitting: Physical Therapy

## 2015-06-02 ENCOUNTER — Ambulatory Visit (HOSPITAL_COMMUNITY): Payer: Federal, State, Local not specified - PPO | Admitting: Physical Therapy

## 2015-06-02 DIAGNOSIS — Z9889 Other specified postprocedural states: Secondary | ICD-10-CM

## 2015-06-02 DIAGNOSIS — M25561 Pain in right knee: Secondary | ICD-10-CM

## 2015-06-02 DIAGNOSIS — R531 Weakness: Secondary | ICD-10-CM

## 2015-06-02 DIAGNOSIS — R600 Localized edema: Secondary | ICD-10-CM

## 2015-06-02 DIAGNOSIS — R269 Unspecified abnormalities of gait and mobility: Secondary | ICD-10-CM

## 2015-06-02 DIAGNOSIS — R2681 Unsteadiness on feet: Secondary | ICD-10-CM

## 2015-06-02 DIAGNOSIS — M25661 Stiffness of right knee, not elsewhere classified: Secondary | ICD-10-CM

## 2015-06-02 DIAGNOSIS — R262 Difficulty in walking, not elsewhere classified: Secondary | ICD-10-CM

## 2015-06-02 NOTE — Therapy (Addendum)
[place] [facility] [address] [place], [place], [address] Phone: [phone]   Fax:  [phone]  Physical Therapy Treatment  Patient Details  Name: Ariel Parker MRN: [id number] Date of Birth: 1990/05/22 Referring Provider: Dr. Orie Fisherman  Encounter Date: 06/02/2015      PT End of Session - 06/02/15 0817    Visit Number 62   Number of Visits 77   Date for PT Re-Evaluation 07/20/15   Authorization Type [employer]/ [employer] secondary (PROGRESS NOTE MUST BE DONE WEEKLY FOR MILITARY, give to patient each Friday)   Authorization Time Period 12/26/14-04/29/15; recert done 1/31, recert done 05/25/15.   PT Start Time 0803   PT Stop Time [phone]   PT Time Calculation (min) 40 min   Activity Tolerance Patient tolerated treatment well   Behavior During Therapy [facility] for tasks assessed/performed      Past Medical History  Diagnosis Date  . Disruption of anterior cruciate ligament of right knee 09/2014    Past Surgical History  Procedure Laterality Date  . Wisdom tooth extraction    . Knee arthroscopy w/ acl reconstruction Left 07/17/2006  . Knee arthroscopy with anterior cruciate ligament (acl) repair with hamstring graft Right 10/14/2014    Procedure: RIGHT KNEE ARTHROSCOPY WITH ANTERIOR CRUCIATE LIGAMENT (ACL) REPAIR;  Surgeon: Mckinley Jewel, MD;  Location: [facility];  Service: Orthopedics;  Laterality: Right;  . Knee arthroscopy with lateral menisectomy Right 10/14/2014    Procedure: KNEE ARTHROSCOPY WITH LATERAL MENISECTOMY;  Surgeon: Mckinley Jewel, MD;  Location: [facility];  Service: Orthopedics;  Laterality: Right;    There were no vitals filed for this visit.  Visit Diagnosis:  S/P ACL repair  Right knee pain  Knee stiffness, right  Unsteadiness  Decreased strength  Difficulty walking  Abnormality of gait  Localized edema      Subjective Assessment - 06/02/15 0811    Subjective Pt reports she is doing good. No  pain or other complaints. She is hoping to get approved for a trip to [place].   Pertinent History Patient was playing soccer, jumped up and landed and heard a pop. ACL surgery was the 21st of July. Things at home have been going OK but at one point MD thought there might have been a blood clot, which did come back negative. Was taken off of CPM due to blood clot concern and to her knowledge is not going to be put back on CPM.    How long can you sit comfortably? no limitations   How long can you stand comfortably? 4+ hours   How long can you walk comfortably? 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 No/denies                         [facility] Adult PT Treatment/Exercise - 06/02/15 0001    Knee/Hip Exercises: Aerobic   Elliptical 7 minute warm-up on L4 to L6   Knee/Hip Exercises: Plyometrics   Bilateral Jumping 2 sets;10 reps;Box Height: 6"  mirror feedback with focus on preventing knee valgus   Unilateral Jumping 2 sets;15 reps  RLE hop jump landing    Knee/Hip Exercises: Standing   Step Down 2 sets;Right;Hand Hold: 1;Step Height: 4"  (+) trendelenburg noted    Knee/Hip Exercises: Supine   Bridges Strengthening;2 sets;10 reps  Single leg 2 sets  on RLE, 1 set on LLE                PT Education - 06/02/15 0815    Education provided Yes   Education Details reviewed HEP and added step down with green TB to cue for knee valgus prevention; encouraged pt to avoid skiing and other extreme activites that may put stress on her knees if she goes.     Person(s) Educated Patient   Methods Explanation   Comprehension Verbalized understanding          PT Short Term Goals - 05/25/15 1249    PT SHORT TERM GOAL #1   Title Patient will demonstrate R knee ROM of 0-120 degrees with no pain   Baseline 05/25/15 0-131   Status Achieved   PT SHORT TERM GOAL #2   Title  Patient will demonstrate at least 4-/5 strength in R knee and at least 4+/5 strength in L LE    Status Achieved   PT SHORT TERM GOAL #3   Title Patient to be ambulating unlimited distances without crutches and WBAT R LE, equal step lengths, equal weight bearing each side, minimal unsteadiness, pain no more than 1/10   Status Achieved   PT SHORT TERM GOAL #4   Title Patient will experience 0/10 pain R knee  with all functional weight bearing tasks and exercises    Status Achieved   PT SHORT TERM GOAL #5   Title Patient to be independent in consistently and correctly performing  appropriate HEP, to be updated PRN    Status Achieved           PT Long Term Goals - 05/25/15 1250    PT LONG TERM GOAL #1   Title Patient to have 0 degrees extension  and 130 degrees flexion R knee, pain 0/10    Baseline 05/25/15,  0-131   Status Achieved   PT LONG TERM GOAL #2   Title Patient will be able to perform single leg squat to floor with R knee, minimal knee valgus, and pain 0/10   Baseline 05/25/15 patient able to perform to approximately 1/2 distance.    Time 6   Period Weeks   Status On-going   PT LONG TERM GOAL #3   Title Patient will be able to perform single leg hops of equal distance with bilateral lower extremities and pain 0/10 R knee    Baseline 05/25/15. Rt 92 cm, Lt 94 cm.    Time 6   Status On-going   PT LONG TERM GOAL #4   Title Patient to be able to jump off of at least 14 inch box and land on bilateral feet with good technique, good knee positioning with minimal knee valgus, and pain R knee 0/10   Status Achieved   PT LONG TERM GOAL #5   Title Patient to be able to maintain single leg stance on BOSU for at least 30 seconds with mild to moderate perturbations, pain R knee 0/10   Baseline 05/25/15. Able to hold for 30 seconds with poor stability.    Period Weeks   Status On-going   PT LONG TERM GOAL #6   Title Patient will demonstrate the ability to safely ascend and descend full  height ladder and crawl around obstacles with pain R knee 0/10   Baseline not tested   Time 6   Period Weeks   Status On-going   PT LONG TERM GOAL #7   Title Pt will ambulate on uneven  surface carrying 50 lbs in backpack to simulate [employer] training exercise.    Baseline Using between 31-38 pounds currently. 05/25/15   Time 6   Period Weeks   Status On-going               Plan - 06/02/15 0850    Clinical Impression Statement Pt session focused on glute activation and strengthening with RLE single leg activity and jump landing. She tolerated treatment well with no pain report and improved technique with verbal and visual cuing throughout session. She continues to demonstrate knee valgus deviation on the RLE during SL activity as well as with BLE jump landing.  Will continue to progress LE stabilization and strengthening activity as appropriate. *Addendum 06/02/15: PT provided note for pt's employer confirming that her trip to [place] will not interfere with her rehab potential at this time.    Pt will benefit from skilled therapeutic intervention in order to improve on the following deficits Abnormal gait;Decreased coordination;Decreased range of motion;Difficulty walking;Impaired tone;Decreased safety awareness;Decreased activity tolerance;Pain;Decreased balance;Impaired flexibility;Improper body mechanics;Decreased mobility;Decreased strength;Increased edema;Postural dysfunction   Rehab Potential Excellent   PT Frequency 2x / week   PT Duration 4 weeks   PT Treatment/Interventions ADLs/Self Care Home Management;Cryotherapy;Electrical Stimulation;DME Instruction;Gait training;Stair training;Functional mobility training;Therapeutic activities;Therapeutic exercise;Balance training;Neuromuscular re-education;Patient/family education;Manual techniques;Passive range of motion   PT Next Visit Plan Cont with strength, endurance, balance activities watching for knee valgus deviations. Increased hip  abductor strength for knee stability with dynamic activity. Monitor for anterior knee pain,.    PT Home Exercise Plan Continue per POC with focus on CKC quad/HS strengthening, R SLS stabilization activities, and manual techniques to improve R knee jt mobility, addition of SL step down with green TB    Consulted and Agree with Plan of Care Patient        Problem List There are no active problems to display for this patient.  8:57 AM,06/02/2015 Marylyn Ishihara PT, DPT [facility] [phone]   * Addendum (06/02/15) 9:17AM : See clinical impression section for updated information  Marylyn Ishihara PT, DPT  [facility] [address] [place], [place], [address] Phone: [phone]   Fax:  [phone]  Name: Dorise Gangi MRN: [id number] Date of Birth: 22-Jul-1990

## 2015-06-03 ENCOUNTER — Encounter (HOSPITAL_COMMUNITY): Admitting: Physical Therapy

## 2015-06-07 ENCOUNTER — Ambulatory Visit (HOSPITAL_COMMUNITY): Payer: Federal, State, Local not specified - PPO | Admitting: Physical Therapy

## 2015-06-07 DIAGNOSIS — Z9889 Other specified postprocedural states: Secondary | ICD-10-CM | POA: Diagnosis not present

## 2015-06-07 DIAGNOSIS — R531 Weakness: Secondary | ICD-10-CM

## 2015-06-07 DIAGNOSIS — M25661 Stiffness of right knee, not elsewhere classified: Secondary | ICD-10-CM

## 2015-06-07 DIAGNOSIS — R269 Unspecified abnormalities of gait and mobility: Secondary | ICD-10-CM

## 2015-06-07 DIAGNOSIS — R2681 Unsteadiness on feet: Secondary | ICD-10-CM

## 2015-06-07 DIAGNOSIS — R262 Difficulty in walking, not elsewhere classified: Secondary | ICD-10-CM

## 2015-06-07 DIAGNOSIS — M25561 Pain in right knee: Secondary | ICD-10-CM

## 2015-06-07 NOTE — Therapy (Signed)
[place] [facility] [address] [place], [place], [address] Phone: [phone]   Fax:  [phone]  Physical Therapy Treatment  Patient Details  Name: Ariel Parker MRN: [id number] Date of Birth: 1990-12-27 Referring Provider: Dr. Orie Fisherman  Encounter Date: 06/07/2015      PT End of Session - 06/07/15 0815    Visit Number 63   Number of Visits 77   Date for PT Re-Evaluation 07/20/15   Authorization Time Period 12/26/14-04/29/15; recert done 1/31, recert done 05/25/15.   PT Start Time 0802   PT Stop Time 0843   PT Time Calculation (min) 41 min   Activity Tolerance Patient tolerated treatment well      Past Medical History  Diagnosis Date  . Disruption of anterior cruciate ligament of right knee 09/2014    Past Surgical History  Procedure Laterality Date  . Wisdom tooth extraction    . Knee arthroscopy w/ acl reconstruction Left 07/17/2006  . Knee arthroscopy with anterior cruciate ligament (acl) repair with hamstring graft Right 10/14/2014    Procedure: RIGHT KNEE ARTHROSCOPY WITH ANTERIOR CRUCIATE LIGAMENT (ACL) REPAIR;  Surgeon: Mckinley Jewel, MD;  Location: [facility];  Service: Orthopedics;  Laterality: Right;  . Knee arthroscopy with lateral menisectomy Right 10/14/2014    Procedure: KNEE ARTHROSCOPY WITH LATERAL MENISECTOMY;  Surgeon: Mckinley Jewel, MD;  Location: [facility];  Service: Orthopedics;  Laterality: Right;    There were no vitals filed for this visit.  Visit Diagnosis:  S/P ACL repair  Right knee pain  Knee stiffness, right  Unsteadiness  Decreased strength  Difficulty walking  Abnormality of gait      Subjective Assessment - 06/07/15 0802    Subjective Pt states that her knee is doing well not complaints today.    Pertinent History Patient was playing soccer, jumped up and landed and heard a pop. ACL surgery was the 21st of July. Things at home have been going OK but at one point MD  thought there might have been a blood clot, which did come back negative. Was taken off of CPM due to blood clot concern and to her knowledge is not going to be put back on CPM.    How long can you sit comfortably? no limitations   How long can you stand comfortably? 4+ hours   How long can you walk comfortably? 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 (which involves [place] of [place])   Pain Score 0-No pain                         [facility] Adult PT Treatment/Exercise - 06/07/15 0001    Exercises   Exercises Knee/Hip   Knee/Hip Exercises: Stretches   Passive Hamstring Stretch Right;2 reps;60 seconds   Quad Stretch Right;2 reps;60 seconds   Knee/Hip Exercises: Plyometrics   Bilateral Jumping 2 sets   Bilateral Jumping Limitations down blue line   Unilateral Jumping 2 sets   Unilateral Jumping Limitations Rt only down blue line    Box Circuit 5 reps;Box Height: 6"   Knee/Hip Exercises: Standing   Heel Raises Right;20 reps   Functional Squat 15 reps;Limitations   Functional Squat Limitations Rt only    Lunge Walking - Round Trips --  2 RT down hall with sports cord    Rocker Board 2 minutes   Other Standing Knee Exercises warrior II x one  minute each    Balance Poses   Warrior III Time 1 minute x 1                   PT Short Term Goals - 05/25/15 1249    PT SHORT TERM GOAL #1   Title Patient will demonstrate R knee ROM of 0-120 degrees with no pain   Baseline 05/25/15 0-131   Status Achieved   PT SHORT TERM GOAL #2   Title Patient will demonstrate at least 4-/5 strength in R knee and at least 4+/5 strength in L LE    Status Achieved   PT SHORT TERM GOAL #3   Title Patient to be ambulating unlimited distances without crutches and WBAT R LE, equal step lengths, equal weight bearing each side, minimal unsteadiness, pain no more than 1/10   Status Achieved   PT SHORT TERM GOAL #4   Title Patient will  experience 0/10 pain R knee  with all functional weight bearing tasks and exercises    Status Achieved   PT SHORT TERM GOAL #5   Title Patient to be independent in consistently and correctly performing  appropriate HEP, to be updated PRN    Status Achieved           PT Long Term Goals - 05/25/15 1250    PT LONG TERM GOAL #1   Title Patient to have 0 degrees extension  and 130 degrees flexion R knee, pain 0/10    Baseline 05/25/15,  0-131   Status Achieved   PT LONG TERM GOAL #2   Title Patient will be able to perform single leg squat to floor with R knee, minimal knee valgus, and pain 0/10   Baseline 05/25/15 patient able to perform to approximately 1/2 distance.    Time 6   Period Weeks   Status On-going   PT LONG TERM GOAL #3   Title Patient will be able to perform single leg hops of equal distance with bilateral lower extremities and pain 0/10 R knee    Baseline 05/25/15. Rt 92 cm, Lt 94 cm.    Time 6   Status On-going   PT LONG TERM GOAL #4   Title Patient to be able to jump off of at least 14 inch box and land on bilateral feet with good technique, good knee positioning with minimal knee valgus, and pain R knee 0/10   Status Achieved   PT LONG TERM GOAL #5   Title Patient to be able to maintain single leg stance on BOSU for at least 30 seconds with mild to moderate perturbations, pain R knee 0/10   Baseline 05/25/15. Able to hold for 30 seconds with poor stability.    Period Weeks   Status On-going   PT LONG TERM GOAL #6   Title Patient will demonstrate the ability to safely ascend and descend full height ladder and crawl around obstacles with pain R knee 0/10   Baseline not tested   Time 6   Period Weeks   Status On-going   PT LONG TERM GOAL #7   Title Pt will ambulate on uneven surface carrying 50 lbs in backpack to simulate [employer] training exercise.    Baseline Using between 31-38 pounds currently. 05/25/15   Time 6   Period Weeks   Status On-going                Plan - 06/07/15 0815    Clinical Impression Statement Pt session focused on  single leg activity for improved strength as well as balance activity.  Pt needed verbal  and visual cuing to perform single leg activity in proper form.(in front of mirror). Pt pain decreasing and functional tolerance is improving.     Pt will benefit from skilled therapeutic intervention in order to improve on the following deficits Abnormal gait;Decreased coordination;Decreased range of motion;Difficulty walking;Impaired tone;Decreased safety awareness;Decreased activity tolerance;Pain;Decreased balance;Impaired flexibility;Improper body mechanics;Decreased mobility;Decreased strength;Increased edema;Postural dysfunction   Rehab Potential Excellent   PT Frequency 2x / week   PT Duration 4 weeks   PT Next Visit Plan test one max rep testing next treatment to assess quad and hamstring ratio.         Problem List There are no active problems to display for this patient.  Virgina OrganCynthia Remmington Teters, PT CLT [phone] 06/07/2015, 8:46 AM  [facility] [address] [place], [place], [address] Phone: [phone]   Fax:  [phone]  Name: Ariel Parker MRN: [id number] Date of Birth: 17-Apr-1990

## 2015-06-08 ENCOUNTER — Encounter (HOSPITAL_COMMUNITY)

## 2015-06-09 ENCOUNTER — Ambulatory Visit (HOSPITAL_COMMUNITY): Payer: Federal, State, Local not specified - PPO | Admitting: Physical Therapy

## 2015-06-09 DIAGNOSIS — R2681 Unsteadiness on feet: Secondary | ICD-10-CM

## 2015-06-09 DIAGNOSIS — Z9889 Other specified postprocedural states: Secondary | ICD-10-CM | POA: Diagnosis not present

## 2015-06-09 DIAGNOSIS — M25561 Pain in right knee: Secondary | ICD-10-CM

## 2015-06-09 DIAGNOSIS — R531 Weakness: Secondary | ICD-10-CM

## 2015-06-09 DIAGNOSIS — M25661 Stiffness of right knee, not elsewhere classified: Secondary | ICD-10-CM

## 2015-06-09 NOTE — Patient Instructions (Signed)
Piriformis Stretch, Sitting    Sit, back straight, one leg straight, other leg bent, ankle on opposite thigh. Grasp knee and ankle of crossed leg. Pull leg toward trunk. Feel stretch in gluteals. Hold _30__ seconds.  Repeat _3__ times per leg, per session. Do _2__ sessions per day.  Copyright  VHI. All rights reserved.

## 2015-06-09 NOTE — Therapy (Signed)
[facility] [address] [place], [place], [address] Phone: [phone]   Fax:  [phone]  Physical Therapy Treatment  Patient Details  Name: Ariel Parker MRN: [id number] Date of Birth: 1991-01-11 Referring Provider: Dr. Orie FishermanMurphey  Encounter Date: 06/09/2015      PT End of Session - 06/09/15 1337    Visit Number 64   Number of Visits 77   Date for PT Re-Evaluation 07/20/15   Authorization Type BCBS/ [employer] secondary (PROGRESS NOTE MUST BE DONE WEEKLY FOR MILITARY, give to patient each Friday)   Authorization Time Period 12/26/14-04/29/15; recert done 1/31, recert done 05/25/15.   PT Start Time 1304   PT Stop Time 1346   PT Time Calculation (min) 42 min   Activity Tolerance Patient tolerated treatment well   Behavior During Therapy [facility]FL for tasks assessed/performed      Past Medical History  Diagnosis Date  . Disruption of anterior cruciate ligament of right knee 09/2014    Past Surgical History  Procedure Laterality Date  . Wisdom tooth extraction    . Knee arthroscopy w/ acl reconstruction Left 07/17/2006  . Knee arthroscopy with anterior cruciate ligament (acl) repair with hamstring graft Right 10/14/2014    Procedure: RIGHT KNEE ARTHROSCOPY WITH ANTERIOR CRUCIATE LIGAMENT (ACL) REPAIR;  Surgeon: Mckinley Jewelaniel Murphy, MD;  Location: [facility];  Service: Orthopedics;  Laterality: Right;  . Knee arthroscopy with lateral menisectomy Right 10/14/2014    Procedure: KNEE ARTHROSCOPY WITH LATERAL MENISECTOMY;  Surgeon: Mckinley Jewelaniel Murphy, MD;  Location: [facility];  Service: Orthopedics;  Laterality: Right;    There were no vitals filed for this visit.  Visit Diagnosis:  S/P ACL repair  Right knee pain  Knee stiffness, right  Unsteadiness  Decreased strength      Subjective Assessment - 06/09/15 1305    Subjective Patient reports that she is still having some tightness in her hips but reports she is not  really stretching except for butterfly stretch; [place] is TBD at this point.    Pertinent History Patient was playing soccer, jumped up and landed and heard a pop. ACL surgery was the 21st of July. Things at home have been going OK but at one point MD thought there might have been a blood clot, which did come back negative. Was taken off of CPM due to blood clot concern and to her knowledge is not going to be put back on CPM.    Currently in Pain? No/denies            [facility] PT Assessment - 06/09/15 0001    Other:   Other/Comments L quad 1 RM 82.5#, R quad 1 RM 70#; L hamstring 1 RM 70#, R hamstring 1 RM 57.5#; R quad is 84.8% of L quad; R hamstring is at 82% of L hamstring. L hamstring:quad ratio 84.8%, R hamstring:quad ratio is 82%                     [facility] Adult PT Treatment/Exercise - 06/09/15 0001    Knee/Hip Exercises: Stretches   Piriformis Stretch 3 reps;30 seconds   Piriformis Stretch Limitations seated    Knee/Hip Exercises: Aerobic   Other Aerobic 3 laps speed walking, 3 laps jogging for warmup    Knee/Hip Exercises: Standing   Functional Squat 15 reps;Limitations;1 set   Functional Squat Limitations Rt only, cues for form    Other Standing Knee Exercises 4 way hip on BOSU 1x10 each side  PT Education - 06/09/15 1751    Education provided Yes          PT Short Term Goals - 05/25/15 1249    PT SHORT TERM GOAL #1   Title Patient will demonstrate R knee ROM of 0-120 degrees with no pain   Baseline 05/25/15 0-131   Status Achieved   PT SHORT TERM GOAL #2   Title Patient will demonstrate at least 4-/5 strength in R knee and at least 4+/5 strength in L LE    Status Achieved   PT SHORT TERM GOAL #3   Title Patient to be ambulating unlimited distances without crutches and WBAT R LE, equal step lengths, equal weight bearing each side, minimal unsteadiness, pain no more than 1/10   Status Achieved   PT SHORT TERM GOAL #4   Title Patient  will experience 0/10 pain R knee  with all functional weight bearing tasks and exercises    Status Achieved   PT SHORT TERM GOAL #5   Title Patient to be independent in consistently and correctly performing  appropriate HEP, to be updated PRN    Status Achieved           PT Long Term Goals - 05/25/15 1250    PT LONG TERM GOAL #1   Title Patient to have 0 degrees extension  and 130 degrees flexion R knee, pain 0/10    Baseline 05/25/15,  0-131   Status Achieved   PT LONG TERM GOAL #2   Title Patient will be able to perform single leg squat to floor with R knee, minimal knee valgus, and pain 0/10   Baseline 05/25/15 patient able to perform to approximately 1/2 distance.    Time 6   Period Weeks   Status On-going   PT LONG TERM GOAL #3   Title Patient will be able to perform single leg hops of equal distance with bilateral lower extremities and pain 0/10 R knee    Baseline 05/25/15. Rt 92 cm, Lt 94 cm.    Time 6   Status On-going   PT LONG TERM GOAL #4   Title Patient to be able to jump off of at least 14 inch box and land on bilateral feet with good technique, good knee positioning with minimal knee valgus, and pain R knee 0/10   Status Achieved   PT LONG TERM GOAL #5   Title Patient to be able to maintain single leg stance on BOSU for at least 30 seconds with mild to moderate perturbations, pain R knee 0/10   Baseline 05/25/15. Able to hold for 30 seconds with poor stability.    Period Weeks   Status On-going   PT LONG TERM GOAL #6   Title Patient will demonstrate the ability to safely ascend and descend full height ladder and crawl around obstacles with pain R knee 0/10   Baseline not tested   Time 6   Period Weeks   Status On-going   PT LONG TERM GOAL #7   Title Pt will ambulate on uneven surface carrying 50 lbs in backpack to simulate [employer] training exercise.    Baseline Using between 31-38 pounds currently. 05/25/15   Time 6   Period Weeks   Status On-going                Plan - 06/09/15 1331    Clinical Impression Statement Patient arrived complaining of hip tightness, reports that she "just feels a lot of pressure in this area";  she does reoprt that she has not really beeen stretching much at home and reported great benefit from piriformis stretch in today's session. Piriformis stretch was added to HEP. Performed 1RM and ratio testing today- see assessment section of note for deatils, but in general strength is improving and trending the right direction. Otherwise continued functional strengthening for general LE stabilization and R LE muscle functional strengthening as well with good form and performance by patient. Muscle fatigue noted throughout session with cahllenging exercises.    Pt will benefit from skilled therapeutic intervention in order to improve on the following deficits Abnormal gait;Decreased coordination;Decreased range of motion;Difficulty walking;Impaired tone;Decreased safety awareness;Decreased activity tolerance;Pain;Decreased balance;Impaired flexibility;Improper body mechanics;Decreased mobility;Decreased strength;Increased edema;Postural dysfunction   Rehab Potential Excellent   PT Frequency 2x / week   PT Duration 4 weeks   PT Treatment/Interventions ADLs/Self Care Home Management;Cryotherapy;Electrical Stimulation;DME Instruction;Gait training;Stair training;Functional mobility training;Therapeutic activities;Therapeutic exercise;Balance training;Neuromuscular re-education;Patient/family education;Manual techniques;Passive range of motion   PT Next Visit Plan continue functional strengthening for R LE, continue to progress towards increasing strength and functional ability to prep for return to [employer] camp    PT Home Exercise Plan Continue per POC with focus on CKC quad/HS strengthening, R SLS stabilization activities, and manual techniques to improve R knee jt mobility, addition of SL step down with green TB    Consulted  and Agree with Plan of Care Patient        Problem List There are no active problems to display for this patient.   Nedra Hai PT, DPT [id number]  [facility] [address] [place], [place], [address] Phone: [phone]   Fax:  [phone]  Name: Nykerria Macconnell MRN: [id number] Date of Birth: 06-11-1990

## 2015-06-14 ENCOUNTER — Ambulatory Visit (HOSPITAL_COMMUNITY): Payer: Federal, State, Local not specified - PPO | Admitting: Physical Therapy

## 2015-06-14 DIAGNOSIS — R2681 Unsteadiness on feet: Secondary | ICD-10-CM

## 2015-06-14 DIAGNOSIS — R531 Weakness: Secondary | ICD-10-CM

## 2015-06-14 DIAGNOSIS — Z9889 Other specified postprocedural states: Secondary | ICD-10-CM

## 2015-06-14 DIAGNOSIS — M25661 Stiffness of right knee, not elsewhere classified: Secondary | ICD-10-CM

## 2015-06-14 DIAGNOSIS — R262 Difficulty in walking, not elsewhere classified: Secondary | ICD-10-CM

## 2015-06-14 DIAGNOSIS — M25561 Pain in right knee: Secondary | ICD-10-CM

## 2015-06-14 NOTE — Therapy (Signed)
[facility] [address] [place], [place], [address] Phone: [phone]   Fax:  [phone]  Physical Therapy Treatment  Patient Details  Name: Ariel Parker MRN: [id number] Date of Birth: 1991-02-11 Referring Provider: Dr. Orie Fisherman  Encounter Date: 06/14/2015      PT End of Session - 06/14/15 0843    Visit Number 65   Number of Visits 77   Date for PT Re-Evaluation 07/20/15   Authorization Type BCBS/ [employer] secondary (PROGRESS NOTE MUST BE DONE WEEKLY FOR MILITARY, give to patient each Friday)   Authorization Time Period 12/26/14-04/29/15; recert done 1/31, recert done 05/25/15.   PT Start Time 0801   PT Stop Time 0837  8 minutes on treadmill at end of session (went to 0845), not included in billing due to distant supervision    PT Time Calculation (min) 36 min   Activity Tolerance Patient tolerated treatment well   Behavior During Therapy [facility] for tasks assessed/performed      Past Medical History  Diagnosis Date  . Disruption of anterior cruciate ligament of right knee 09/2014    Past Surgical History  Procedure Laterality Date  . Wisdom tooth extraction    . Knee arthroscopy w/ acl reconstruction Left 07/17/2006  . Knee arthroscopy with anterior cruciate ligament (acl) repair with hamstring graft Right 10/14/2014    Procedure: RIGHT KNEE ARTHROSCOPY WITH ANTERIOR CRUCIATE LIGAMENT (ACL) REPAIR;  Surgeon: Mckinley Jewel, MD;  Location: [facility];  Service: Orthopedics;  Laterality: Right;  . Knee arthroscopy with lateral menisectomy Right 10/14/2014    Procedure: KNEE ARTHROSCOPY WITH LATERAL MENISECTOMY;  Surgeon: Mckinley Jewel, MD;  Location: [facility];  Service: Orthopedics;  Laterality: Right;    There were no vitals filed for this visit.  Visit Diagnosis:  S/P ACL repair  Right knee pain  Knee stiffness, right  Unsteadiness  Decreased strength  Difficulty walking      Subjective  Assessment - 06/14/15 0802    Subjective Patient arrives with backpack today, reports that she had a good weekend, no pain, and that [place] trip is likely being postponed to April    Pertinent History Patient was playing soccer, jumped up and landed and heard a pop. ACL surgery was the 21st of July. Things at home have been going OK but at one point MD thought there might have been a blood clot, which did come back negative. Was taken off of CPM due to blood clot concern and to her knowledge is not going to be put back on CPM.    Currently in Pain? No/denies                         [facility] Adult PT Treatment/Exercise - 06/14/15 0001    Knee/Hip Exercises: Aerobic   Tread Mill 8 minutes at 5.0, incline 0%    Other Aerobic 3 laps speed walking, 3 laps jogging for warmup with loaded backpack; 2 laps speedwalking/2 laps jogging with loadded pack outside over grass and gravel    Knee/Hip Exercises: Plyometrics   Bilateral Jumping 1 set   Bilateral Jumping Limitations down blue line with loaded pack   also 2 rounds double leg jumps with no weight    Unilateral Jumping 2 sets   Unilateral Jumping Limitations Rt only down blue line    Other Plyometric Exercises square jumps x5 each way    Knee/Hip Exercises: Standing   Heel Raises Right;20 reps  Functional Squat 15 reps;Limitations;1 set   Functional Squat Limitations Rt only, cues for form; wall slides    Other Standing Knee Exercises 4 way hip on BOSU 1x10 each side    Other Standing Knee Exercises kneel to stand with loaded pack, 1x10 each side                 PT Education - 06/14/15 0843    Education provided Yes   Education Details encouraged to start training more running and cardio at gym    Person(s) Educated Patient   Methods Explanation   Comprehension Verbalized understanding          PT Short Term Goals - 05/25/15 1249    PT SHORT TERM GOAL #1   Title Patient will demonstrate R knee ROM of 0-120  degrees with no pain   Baseline 05/25/15 0-131   Status Achieved   PT SHORT TERM GOAL #2   Title Patient will demonstrate at least 4-/5 strength in R knee and at least 4+/5 strength in L LE    Status Achieved   PT SHORT TERM GOAL #3   Title Patient to be ambulating unlimited distances without crutches and WBAT R LE, equal step lengths, equal weight bearing each side, minimal unsteadiness, pain no more than 1/10   Status Achieved   PT SHORT TERM GOAL #4   Title Patient will experience 0/10 pain R knee  with all functional weight bearing tasks and exercises    Status Achieved   PT SHORT TERM GOAL #5   Title Patient to be independent in consistently and correctly performing  appropriate HEP, to be updated PRN    Status Achieved           PT Long Term Goals - 05/25/15 1250    PT LONG TERM GOAL #1   Title Patient to have 0 degrees extension  and 130 degrees flexion R knee, pain 0/10    Baseline 05/25/15,  0-131   Status Achieved   PT LONG TERM GOAL #2   Title Patient will be able to perform single leg squat to floor with R knee, minimal knee valgus, and pain 0/10   Baseline 05/25/15 patient able to perform to approximately 1/2 distance.    Time 6   Period Weeks   Status On-going   PT LONG TERM GOAL #3   Title Patient will be able to perform single leg hops of equal distance with bilateral lower extremities and pain 0/10 R knee    Baseline 05/25/15. Rt 92 cm, Lt 94 cm.    Time 6   Status On-going   PT LONG TERM GOAL #4   Title Patient to be able to jump off of at least 14 inch box and land on bilateral feet with good technique, good knee positioning with minimal knee valgus, and pain R knee 0/10   Status Achieved   PT LONG TERM GOAL #5   Title Patient to be able to maintain single leg stance on BOSU for at least 30 seconds with mild to moderate perturbations, pain R knee 0/10   Baseline 05/25/15. Able to hold for 30 seconds with poor stability.    Period Weeks   Status On-going   PT LONG  TERM GOAL #6   Title Patient will demonstrate the ability to safely ascend and descend full height ladder and crawl around obstacles with pain R knee 0/10   Baseline not tested   Time 6   Period Weeks  Status On-going   PT LONG TERM GOAL #7   Title Pt will ambulate on uneven surface carrying 50 lbs in backpack to simulate [employer] training exercise.    Baseline Using between 31-38 pounds currently. 05/25/15   Time 6   Period Weeks   Status On-going               Plan - 06/14/15 0819    Clinical Impression Statement Patient reports that the piriformis stretches prescribed last session were quite helpful, she feels very loosened up after using them. Continued functional exercises and pylometrics with and without pack today with focus on stabilty and R LE functional strengthening today with only occasional cues for form.  Introduced running otuside and jumping with loaded pack today, also cues for form to promote good knee mechanics. Encouraged patient to start doing more cardio at gym to assist functional activity tolerance and general endurance.    Pt will benefit from skilled therapeutic intervention in order to improve on the following deficits Abnormal gait;Decreased coordination;Decreased range of motion;Difficulty walking;Impaired tone;Decreased safety awareness;Decreased activity tolerance;Pain;Decreased balance;Impaired flexibility;Improper body mechanics;Decreased mobility;Decreased strength;Increased edema;Postural dysfunction   Rehab Potential Excellent   PT Frequency 2x / week   PT Duration 4 weeks   PT Treatment/Interventions ADLs/Self Care Home Management;Cryotherapy;Electrical Stimulation;DME Instruction;Gait training;Stair training;Functional mobility training;Therapeutic activities;Therapeutic exercise;Balance training;Neuromuscular re-education;Patient/family education;Manual techniques;Passive range of motion   PT Next Visit Plan continue functional strengthening for R  LE, continue to progress towards increasing strength and functional ability to prep for return to [employer] camp    PT Home Exercise Plan Continue per POC with focus on CKC quad/HS strengthening, R SLS stabilization activities, and manual techniques to improve R knee jt mobility, addition of SL step down with green TB    Consulted and Agree with Plan of Care Patient        Problem List There are no active problems to display for this patient.   Nedra HaiKristen Unger PT, DPT [phone]  [facility] [address] [place], [place], [address] Phone: [phone]   Fax:  [phone]  Name: Ariel Parker MRN: [id number] Date of Birth: Aug 10, 1990

## 2015-06-16 ENCOUNTER — Ambulatory Visit (HOSPITAL_COMMUNITY): Payer: Federal, State, Local not specified - PPO | Admitting: Physical Therapy

## 2015-06-16 DIAGNOSIS — R262 Difficulty in walking, not elsewhere classified: Secondary | ICD-10-CM

## 2015-06-16 DIAGNOSIS — Z9889 Other specified postprocedural states: Secondary | ICD-10-CM

## 2015-06-16 DIAGNOSIS — R531 Weakness: Secondary | ICD-10-CM

## 2015-06-16 DIAGNOSIS — M25661 Stiffness of right knee, not elsewhere classified: Secondary | ICD-10-CM

## 2015-06-16 DIAGNOSIS — R2681 Unsteadiness on feet: Secondary | ICD-10-CM

## 2015-06-16 DIAGNOSIS — M25561 Pain in right knee: Secondary | ICD-10-CM

## 2015-06-16 DIAGNOSIS — R269 Unspecified abnormalities of gait and mobility: Secondary | ICD-10-CM

## 2015-06-16 NOTE — Therapy (Signed)
[facility] [address] [place], [place], [address] Phone: [phone]   Fax:  [phone]  Physical Therapy Treatment  Patient Details  Name: Ariel Parker MRN: [id number] Date of Birth: 1990-08-06 Referring Provider: Dr. Orie Fisherman  Encounter Date: 06/16/2015      PT End of Session - 06/16/15 0853    Visit Number 66   Number of Visits 77   Date for PT Re-Evaluation 07/20/15   Authorization Type BCBS/ [employer] secondary (PROGRESS NOTE MUST BE DONE WEEKLY FOR MILITARY, give to patient each Friday)   Authorization Time Period 12/26/14-04/29/15; recert done 1/31, recert done 05/25/15.   PT Start Time [phone]  patient arrived late today    PT Stop Time 0846   PT Time Calculation (min) 34 min   Activity Tolerance Patient tolerated treatment well   Behavior During Therapy [facility] for tasks assessed/performed      Past Medical History  Diagnosis Date  . Disruption of anterior cruciate ligament of right knee 09/2014    Past Surgical History  Procedure Laterality Date  . Wisdom tooth extraction    . Knee arthroscopy w/ acl reconstruction Left 07/17/2006  . Knee arthroscopy with anterior cruciate ligament (acl) repair with hamstring graft Right 10/14/2014    Procedure: RIGHT KNEE ARTHROSCOPY WITH ANTERIOR CRUCIATE LIGAMENT (ACL) REPAIR;  Surgeon: Mckinley Jewel, MD;  Location: [facility];  Service: Orthopedics;  Laterality: Right;  . Knee arthroscopy with lateral menisectomy Right 10/14/2014    Procedure: KNEE ARTHROSCOPY WITH LATERAL MENISECTOMY;  Surgeon: Mckinley Jewel, MD;  Location: [facility];  Service: Orthopedics;  Laterality: Right;    There were no vitals filed for this visit.  Visit Diagnosis:  S/P ACL repair  Right knee pain  Knee stiffness, right  Unsteadiness  Decreased strength  Difficulty walking  Abnormality of gait      Subjective Assessment - 06/16/15 0813    Subjective Patient arrived late  today, apologetic and reports that she slept through her alarm by accident    Currently in Pain? No/denies                         [facility] Adult PT Treatment/Exercise - 06/16/15 0001    Knee/Hip Exercises: Aerobic   Other Aerobic 3 laps speed walk, 3 laps jog for warmup with loaded pack    Knee/Hip Exercises: Machines for Strengthening   Cybex Knee Extension 2 sets plate 6 R LE X52; also cybex hamstring curls plate 4 8U13 R LE    Knee/Hip Exercises: Standing   Heel Raises Right;20 reps   Other Standing Knee Exercises 4 way hip on BOSU 1x15 each side    Other Standing Knee Exercises attempted froggers, unable; burpees x132ft; kneel to stand 1x10 each side with pack                 PT Education - 06/16/15 0852    Education provided No          PT Short Term Goals - 05/25/15 1249    PT SHORT TERM GOAL #1   Title Patient will demonstrate R knee ROM of 0-120 degrees with no pain   Baseline 05/25/15 0-131   Status Achieved   PT SHORT TERM GOAL #2   Title Patient will demonstrate at least 4-/5 strength in R knee and at least 4+/5 strength in L LE    Status Achieved   PT SHORT TERM GOAL #3  Title Patient to be ambulating unlimited distances without crutches and WBAT R LE, equal step lengths, equal weight bearing each side, minimal unsteadiness, pain no more than 1/10   Status Achieved   PT SHORT TERM GOAL #4   Title Patient will experience 0/10 pain R knee  with all functional weight bearing tasks and exercises    Status Achieved   PT SHORT TERM GOAL #5   Title Patient to be independent in consistently and correctly performing  appropriate HEP, to be updated PRN    Status Achieved           PT Long Term Goals - 05/25/15 1250    PT LONG TERM GOAL #1   Title Patient to have 0 degrees extension  and 130 degrees flexion R knee, pain 0/10    Baseline 05/25/15,  0-131   Status Achieved   PT LONG TERM GOAL #2   Title Patient will be able to perform single leg  squat to floor with R knee, minimal knee valgus, and pain 0/10   Baseline 05/25/15 patient able to perform to approximately 1/2 distance.    Time 6   Period Weeks   Status On-going   PT LONG TERM GOAL #3   Title Patient will be able to perform single leg hops of equal distance with bilateral lower extremities and pain 0/10 R knee    Baseline 05/25/15. Rt 92 cm, Lt 94 cm.    Time 6   Status On-going   PT LONG TERM GOAL #4   Title Patient to be able to jump off of at least 14 inch box and land on bilateral feet with good technique, good knee positioning with minimal knee valgus, and pain R knee 0/10   Status Achieved   PT LONG TERM GOAL #5   Title Patient to be able to maintain single leg stance on BOSU for at least 30 seconds with mild to moderate perturbations, pain R knee 0/10   Baseline 05/25/15. Able to hold for 30 seconds with poor stability.    Period Weeks   Status On-going   PT LONG TERM GOAL #6   Title Patient will demonstrate the ability to safely ascend and descend full height ladder and crawl around obstacles with pain R knee 0/10   Baseline not tested   Time 6   Period Weeks   Status On-going   PT LONG TERM GOAL #7   Title Pt will ambulate on uneven surface carrying 50 lbs in backpack to simulate [employer] training exercise.    Baseline Using between 31-38 pounds currently. 05/25/15   Time 6   Period Weeks   Status On-going               Plan - 06/16/15 0834    Clinical Impression Statement Patient arrived late today due to accidentally sleeping in; focused on dynamic strengthening today with hip exercises, weighted LAQs and hamstring curls, and introduced froggers/burpees today. Patient unable to correctly perform forggers due to limited flexion range in knee, however able to perform but fatigued by burpees. Patient does complain of non-painful crepitus in her knee, continues to report that her knee just feels stiff. Pack weighs in at about 31.2 pounds today.    Pt will  benefit from skilled therapeutic intervention in order to improve on the following deficits Abnormal gait;Decreased coordination;Decreased range of motion;Difficulty walking;Impaired tone;Decreased safety awareness;Decreased activity tolerance;Pain;Decreased balance;Impaired flexibility;Improper body mechanics;Decreased mobility;Decreased strength;Increased edema;Postural dysfunction   Rehab Potential Excellent   PT  Frequency 2x / week   PT Duration 4 weeks   PT Treatment/Interventions ADLs/Self Care Home Management;Cryotherapy;Electrical Stimulation;DME Instruction;Gait training;Stair training;Functional mobility training;Therapeutic activities;Therapeutic exercise;Balance training;Neuromuscular re-education;Patient/family education;Manual techniques;Passive range of motion   PT Next Visit Plan continue functional strengthening for R LE, continue to progress towards increasing strength and functional ability to prep for return to [employer]    PT Home Exercise Plan Continue per POC with focus on CKC quad/HS strengthening, R SLS stabilization activities, and manual techniques to improve R knee jt mobility, addition of SL step down with green TB    Consulted and Agree with Plan of Care Patient        Problem List There are no active problems to display for this patient.   Nedra HaiKristen Seymore Brodowski PT, DPT [phone]  [facility] [address] [place], [place], [address] Phone: [phone]   Fax:  [phone]  Name: Hiram Gashrista Huseby MRN: [id number] Date of Birth: 08-Jul-1990

## 2015-06-21 ENCOUNTER — Ambulatory Visit (HOSPITAL_COMMUNITY): Payer: Federal, State, Local not specified - PPO

## 2015-06-21 DIAGNOSIS — R600 Localized edema: Secondary | ICD-10-CM

## 2015-06-21 DIAGNOSIS — R531 Weakness: Secondary | ICD-10-CM

## 2015-06-21 DIAGNOSIS — R2681 Unsteadiness on feet: Secondary | ICD-10-CM

## 2015-06-21 DIAGNOSIS — M25561 Pain in right knee: Secondary | ICD-10-CM

## 2015-06-21 DIAGNOSIS — R269 Unspecified abnormalities of gait and mobility: Secondary | ICD-10-CM

## 2015-06-21 DIAGNOSIS — M25661 Stiffness of right knee, not elsewhere classified: Secondary | ICD-10-CM

## 2015-06-21 DIAGNOSIS — R262 Difficulty in walking, not elsewhere classified: Secondary | ICD-10-CM

## 2015-06-21 DIAGNOSIS — Z9889 Other specified postprocedural states: Secondary | ICD-10-CM

## 2015-06-21 NOTE — Therapy (Signed)
[facility] [address] [place], [place], [address] Phone: [phone]   Fax:  [phone]  Physical Therapy Treatment  Patient Details  Name: Ariel Parker MRN: [id number] Date of Birth: June 13, 1990 Referring Provider: Dr. Orie Fisherman  Encounter Date: 06/21/2015      PT End of Session - 06/21/15 0851    Visit Number 67   Number of Visits 77   Date for PT Re-Evaluation 07/20/15   Authorization Type BCBS/ [employer] secondary (PROGRESS NOTE MUST BE DONE WEEKLY FOR MILITARY, give to patient each Friday)   Authorization Time Period 12/26/14-04/29/15; recert done 1/31, recert done 05/25/15.   PT Start Time 0800   PT Stop Time 0857   PT Time Calculation (min) 57 min   Equipment Utilized During Treatment Other (comment)  weighted pack through therex   Activity Tolerance Patient tolerated treatment well   Behavior During Therapy [facility] for tasks assessed/performed      Past Medical History  Diagnosis Date  . Disruption of anterior cruciate ligament of right knee 09/2014    Past Surgical History  Procedure Laterality Date  . Wisdom tooth extraction    . Knee arthroscopy w/ acl reconstruction Left 07/17/2006  . Knee arthroscopy with anterior cruciate ligament (acl) repair with hamstring graft Right 10/14/2014    Procedure: RIGHT KNEE ARTHROSCOPY WITH ANTERIOR CRUCIATE LIGAMENT (ACL) REPAIR;  Surgeon: Mckinley Jewel, MD;  Location: [facility];  Service: Orthopedics;  Laterality: Right;  . Knee arthroscopy with lateral menisectomy Right 10/14/2014    Procedure: KNEE ARTHROSCOPY WITH LATERAL MENISECTOMY;  Surgeon: Mckinley Jewel, MD;  Location: [facility];  Service: Orthopedics;  Laterality: Right;    There were no vitals filed for this visit.  Visit Diagnosis:  S/P ACL repair  Right knee pain  Knee stiffness, right  Unsteadiness  Decreased strength  Difficulty walking  Abnormality of gait  Localized edema       Subjective Assessment - 06/21/15 0806    Subjective Pt stated no reports of paIn today, does complain of swelling   Pertinent History Patient was playing soccer, jumped up and landed and heard a pop. ACL surgery was the 21st of July. Things at home have been going OK but at one point MD thought there might have been a blood clot, which did come back negative. Was taken off of CPM due to blood clot concern and to her knowledge is not going to be put back on CPM.    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 No/denies             [facility] Adult PT Treatment/Exercise - 06/21/15 0001    Knee/Hip Exercises: Aerobic   Other Aerobic 3 laps speed walk, 3 laps jog for warmup with loaded pack    Knee/Hip Exercises: Machines for Strengthening   Cybex Knee Extension 2 sets x 10 reps 6Pl Rt LE   Cybex Knee Flexion 2 sets x 10 4Pl   Knee/Hip Exercises: Plyometrics   Bilateral Jumping 1 set   Bilateral Jumping Limitations down blue line with loaded pack    Unilateral Jumping 2 sets   Unilateral Jumping Limitations Rt only down blue line    Box Circuit 5 reps;Box Height: 6"   Knee/Hip Exercises: Standing   Step Down Left;15 reps;Hand Hold: 1;Step Height: 6"   Step Down Limitations theraband   Functional Squat 15 reps;Limitations;1  set   Functional Squat Limitations Rt only, cues for form; wall slides    Other Standing Knee Exercises mountain climbers 2x 20 reps cueing for knee alignment; burpees x 100 ft   Other Standing Knee Exercises kneel to stand with pack on                  PT Short Term Goals - 05/25/15 1249    PT SHORT TERM GOAL #1   Title Patient will demonstrate R knee ROM of 0-120 degrees with no pain   Baseline 05/25/15 0-131   Status Achieved   PT SHORT TERM GOAL #2   Title Patient will demonstrate at least 4-/5 strength in R knee and at least 4+/5 strength in L LE     Status Achieved   PT SHORT TERM GOAL #3   Title Patient to be ambulating unlimited distances without crutches and WBAT R LE, equal step lengths, equal weight bearing each side, minimal unsteadiness, pain no more than 1/10   Status Achieved   PT SHORT TERM GOAL #4   Title Patient will experience 0/10 pain R knee  with all functional weight bearing tasks and exercises    Status Achieved   PT SHORT TERM GOAL #5   Title Patient to be independent in consistently and correctly performing  appropriate HEP, to be updated PRN    Status Achieved           PT Long Term Goals - 05/25/15 1250    PT LONG TERM GOAL #1   Title Patient to have 0 degrees extension  and 130 degrees flexion R knee, pain 0/10    Baseline 05/25/15,  0-131   Status Achieved   PT LONG TERM GOAL #2   Title Patient will be able to perform single leg squat to floor with R knee, minimal knee valgus, and pain 0/10   Baseline 05/25/15 patient able to perform to approximately 1/2 distance.    Time 6   Period Weeks   Status On-going   PT LONG TERM GOAL #3   Title Patient will be able to perform single leg hops of equal distance with bilateral lower extremities and pain 0/10 R knee    Baseline 05/25/15. Rt 92 cm, Lt 94 cm.    Time 6   Status On-going   PT LONG TERM GOAL #4   Title Patient to be able to jump off of at least 14 inch box and land on bilateral feet with good technique, good knee positioning with minimal knee valgus, and pain R knee 0/10   Status Achieved   PT LONG TERM GOAL #5   Title Patient to be able to maintain single leg stance on BOSU for at least 30 seconds with mild to moderate perturbations, pain R knee 0/10   Baseline 05/25/15. Able to hold for 30 seconds with poor stability.    Period Weeks   Status On-going   PT LONG TERM GOAL #6   Title Patient will demonstrate the ability to safely ascend and descend full height ladder and crawl around obstacles with pain R knee 0/10   Baseline not tested   Time 6    Period Weeks   Status On-going   PT LONG TERM GOAL #7   Title Pt will ambulate on uneven surface carrying 50 lbs in backpack to simulate [employer] training exercise.    Baseline Using between 31-38 pounds currently. 05/25/15   Time 6   Period Weeks   Status On-going  Plan - 06/21/15 0857    Clinical Impression Statement Session focus on improving dynamic functional strengthening with and without weighted pack for RTW activities.  Added mountain climbers to improve knee flexion with plyometric activtieis for quad strenghtening with increased cueing to improve form due to limited knee flexion range of motion with scar tissue.  Pt limited by fatigue with activities, no reports of pain.  Pt stated she had plans to drive to [facility] and had increaed edema through session, ice applied at end of sessiion.   Pt will benefit from skilled therapeutic intervention in order to improve on the following deficits Abnormal gait;Decreased coordination;Decreased range of motion;Difficulty walking;Impaired tone;Decreased safety awareness;Decreased activity tolerance;Pain;Decreased balance;Impaired flexibility;Improper body mechanics;Decreased mobility;Decreased strength;Increased edema;Postural dysfunction   Rehab Potential Excellent   PT Frequency 2x / week   PT Duration 4 weeks   PT Treatment/Interventions ADLs/Self Care Home Management;Cryotherapy;Electrical Stimulation;DME Instruction;Gait training;Stair training;Functional mobility training;Therapeutic activities;Therapeutic exercise;Balance training;Neuromuscular re-education;Patient/family education;Manual techniques;Passive range of motion   PT Next Visit Plan continue functional strengthening for R LE, continue to progress towards increasing strength and functional ability to prep for return to [employer] camp         Problem List There are no active problems to display for this patient.  [address], LPTA;  CBIS [phone]  Juel Burrow 06/21/2015, 9:11 AM  [facility] [address] [place], [place], [address] Phone: [phone]   Fax:  [phone]  Name: Ariel Parker MRN: [id number] Date of Birth: 09/30/1990

## 2015-06-23 ENCOUNTER — Ambulatory Visit (HOSPITAL_COMMUNITY): Payer: Federal, State, Local not specified - PPO | Admitting: Physical Therapy

## 2015-06-23 DIAGNOSIS — M25561 Pain in right knee: Secondary | ICD-10-CM

## 2015-06-23 DIAGNOSIS — R269 Unspecified abnormalities of gait and mobility: Secondary | ICD-10-CM

## 2015-06-23 DIAGNOSIS — M25661 Stiffness of right knee, not elsewhere classified: Secondary | ICD-10-CM

## 2015-06-23 DIAGNOSIS — Z9889 Other specified postprocedural states: Secondary | ICD-10-CM

## 2015-06-23 DIAGNOSIS — R262 Difficulty in walking, not elsewhere classified: Secondary | ICD-10-CM

## 2015-06-23 DIAGNOSIS — R2681 Unsteadiness on feet: Secondary | ICD-10-CM

## 2015-06-23 DIAGNOSIS — R531 Weakness: Secondary | ICD-10-CM

## 2015-06-23 NOTE — Therapy (Signed)
[facility] [address] [place], [place], [address] Phone: [phone]   Fax:  [phone]  Physical Therapy Treatment  Patient Details  Name: Ariel Parker MRN: [id number] Date of Birth: 22-Dec-1990 Referring Provider: Dr. Orie FishermanMurphey  Encounter Date: 06/23/2015      PT End of Session - 06/23/15 0818    Visit Number 68   Number of Visits 77   Date for PT Re-Evaluation 07/20/15   Authorization Type BCBS/ [employer] secondary (PROGRESS NOTE MUST BE DONE WEEKLY FOR MILITARY, give to patient each Friday)   Authorization Time Period 12/26/14-04/29/15; recert done 1/31, recert done 05/25/15.   PT Start Time 0801   PT Stop Time 0845   PT Time Calculation (min) 44 min   Activity Tolerance Patient tolerated treatment well   Behavior During Therapy [facility] for tasks assessed/performed      Past Medical History  Diagnosis Date  . Disruption of anterior cruciate ligament of right knee 09/2014    Past Surgical History  Procedure Laterality Date  . Wisdom tooth extraction    . Knee arthroscopy w/ acl reconstruction Left 07/17/2006  . Knee arthroscopy with anterior cruciate ligament (acl) repair with hamstring graft Right 10/14/2014    Procedure: RIGHT KNEE ARTHROSCOPY WITH ANTERIOR CRUCIATE LIGAMENT (ACL) REPAIR;  Surgeon: Mckinley Jewelaniel Murphy, MD;  Location: [facility];  Service: Orthopedics;  Laterality: Right;  . Knee arthroscopy with lateral menisectomy Right 10/14/2014    Procedure: KNEE ARTHROSCOPY WITH LATERAL MENISECTOMY;  Surgeon: Mckinley Jewelaniel Murphy, MD;  Location: [facility];  Service: Orthopedics;  Laterality: Right;    There were no vitals filed for this visit.  Visit Diagnosis:  S/P ACL repair  Right knee pain  Knee stiffness, right  Unsteadiness  Decreased strength  Difficulty walking  Abnormality of gait      Subjective Assessment - 06/23/15 0804    Subjective Pt states that she is a little stiff today but  otherwise is feeling good; no pain    Currently in Pain? No/denies              [facility] Adult PT Treatment/Exercise - 06/23/15 0001    Knee/Hip Exercises: Stretches   Quad Stretch Right;2 reps;60 seconds   Knee/Hip Exercises: Plyometrics   Box Circuit 5 reps;Box Height: 6"   Knee/Hip Exercises: Standing   Heel Raises Right;20 reps   Step Down Left;15 reps;Step Height: 8"   Functional Squat 15 reps   Functional Squat Limitations RT    Rocker Board 2 minutes   SLS 30 " x 3    Other Standing Knee Exercises mountain climbers 2x 20 reps cueing for knee alignment; burpees x 100 ft   Other Standing Knee Exercises kneel to stand with pack onx 20                   PT Short Term Goals - 05/25/15 1249    PT SHORT TERM GOAL #1   Title Patient will demonstrate R knee ROM of 0-120 degrees with no pain   Baseline 05/25/15 0-131   Status Achieved   PT SHORT TERM GOAL #2   Title Patient will demonstrate at least 4-/5 strength in R knee and at least 4+/5 strength in L LE    Status Achieved   PT SHORT TERM GOAL #3   Title Patient to be ambulating unlimited distances without crutches and WBAT R LE, equal step lengths, equal weight bearing each side, minimal unsteadiness, pain no more than  1/10   Status Achieved   PT SHORT TERM GOAL #4   Title Patient will experience 0/10 pain R knee  with all functional weight bearing tasks and exercises    Status Achieved   PT SHORT TERM GOAL #5   Title Patient to be independent in consistently and correctly performing  appropriate HEP, to be updated PRN    Status Achieved           PT Long Term Goals - 05/25/15 1250    PT LONG TERM GOAL #1   Title Patient to have 0 degrees extension  and 130 degrees flexion R knee, pain 0/10    Baseline 05/25/15,  0-131   Status Achieved   PT LONG TERM GOAL #2   Title Patient will be able to perform single leg squat to floor with R knee, minimal knee valgus, and pain 0/10   Baseline 05/25/15 patient able to  perform to approximately 1/2 distance.    Time 6   Period Weeks   Status On-going   PT LONG TERM GOAL #3   Title Patient will be able to perform single leg hops of equal distance with bilateral lower extremities and pain 0/10 R knee    Baseline 05/25/15. Rt 92 cm, Lt 94 cm.    Time 6   Status On-going   PT LONG TERM GOAL #4   Title Patient to be able to jump off of at least 14 inch box and land on bilateral feet with good technique, good knee positioning with minimal knee valgus, and pain R knee 0/10   Status Achieved   PT LONG TERM GOAL #5   Title Patient to be able to maintain single leg stance on BOSU for at least 30 seconds with mild to moderate perturbations, pain R knee 0/10   Baseline 05/25/15. Able to hold for 30 seconds with poor stability.    Period Weeks   Status On-going   PT LONG TERM GOAL #6   Title Patient will demonstrate the ability to safely ascend and descend full height ladder and crawl around obstacles with pain R knee 0/10   Baseline not tested   Time 6   Period Weeks   Status On-going   PT LONG TERM GOAL #7   Title Pt will ambulate on uneven surface carrying 50 lbs in backpack to simulate [employer] training exercise.    Baseline Using between 31-38 pounds currently. 05/25/15   Time 6   Period Weeks   Status On-going               Plan - 06/23/15 0820    Clinical Impression Statement Pt continues to improve with dynamiic strengthening of her Rt knee and hamstring musculature with pt needingt decreased verbal cuings for technique.  Pt voices no complaint of pain at end of session.    PT Home Exercise Plan Continue per POC with focus on CKC quad/HS strengthening, R SLS stabilization activities, and manual techniques to improve R knee jt mobility, addition of SL step down with green TB         Problem List There are no active problems to display for this patient.   Virgina Organ, PT CLT [phone] 06/23/2015, 8:46 AM  [place] [facility] [address] [place], [place], [address] Phone: [phone]   Fax:  [phone]  Name: Ariel Parker MRN: [id number] Date of Birth: 1991/03/11

## 2015-06-27 ENCOUNTER — Ambulatory Visit (HOSPITAL_COMMUNITY): Payer: Federal, State, Local not specified - PPO | Attending: Orthopedic Surgery | Admitting: Physical Therapy

## 2015-06-27 DIAGNOSIS — R262 Difficulty in walking, not elsewhere classified: Secondary | ICD-10-CM | POA: Insufficient documentation

## 2015-06-27 DIAGNOSIS — M6281 Muscle weakness (generalized): Secondary | ICD-10-CM | POA: Diagnosis not present

## 2015-06-27 DIAGNOSIS — Z9889 Other specified postprocedural states: Secondary | ICD-10-CM | POA: Insufficient documentation

## 2015-06-27 DIAGNOSIS — M25561 Pain in right knee: Secondary | ICD-10-CM | POA: Diagnosis not present

## 2015-06-27 DIAGNOSIS — M25661 Stiffness of right knee, not elsewhere classified: Secondary | ICD-10-CM | POA: Diagnosis not present

## 2015-06-27 DIAGNOSIS — R2681 Unsteadiness on feet: Secondary | ICD-10-CM | POA: Insufficient documentation

## 2015-06-27 DIAGNOSIS — R531 Weakness: Secondary | ICD-10-CM

## 2015-06-27 NOTE — Therapy (Signed)
[facility] [address] [place], [place], [address] Phone: [phone]   Fax:  [phone]  Physical Therapy Treatment  Patient Details  Name: Hiram Gashrista Mesch MRN: [id number] Date of Birth: 1990/04/30 Referring Provider: Dr. Orie FishermanMurphey  Encounter Date: 06/27/2015      PT End of Session - 06/27/15 0838    Visit Number [id number]   Number of Visits 77   Date for PT Re-Evaluation 07/20/15   Authorization Type BCBS/ [employer] secondary (PROGRESS NOTE MUST BE DONE WEEKLY FOR MILITARY, give to patient each Friday)   Authorization Time Period 12/26/14-04/29/15; recert done 1/31, recert done 05/25/15.   PT Start Time 0800   PT Stop Time 0843   PT Time Calculation (min) 43 min   Activity Tolerance Patient tolerated treatment well   Behavior During Therapy [facility] for tasks assessed/performed      Past Medical History  Diagnosis Date  . Disruption of anterior cruciate ligament of right knee 09/2014    Past Surgical History  Procedure Laterality Date  . Wisdom tooth extraction    . Knee arthroscopy w/ acl reconstruction Left 07/17/2006  . Knee arthroscopy with anterior cruciate ligament (acl) repair with hamstring graft Right 10/14/2014    Procedure: RIGHT KNEE ARTHROSCOPY WITH ANTERIOR CRUCIATE LIGAMENT (ACL) REPAIR;  Surgeon: Mckinley Jewelaniel Murphy, MD;  Location: [facility];  Service: Orthopedics;  Laterality: Right;  . Knee arthroscopy with lateral menisectomy Right 10/14/2014    Procedure: KNEE ARTHROSCOPY WITH LATERAL MENISECTOMY;  Surgeon: Mckinley Jewelaniel Murphy, MD;  Location:  SUR[facility]ERY CENTER;  Service: Orthopedics;  Laterality: Right;    There were no vitals filed for this visit.  Visit Diagnosis:  S/P ACL repair  Right knee pain  Knee stiffness, right  Unsteadiness  Decreased strength  Difficulty walking      Subjective Assessment - 06/27/15 0802    Subjective Patient reports she had to do quite a bit of standing and getting up and  down this weekend, her knee swole up quite a bit and is painful right now. She reports that it is getting better but is still concerning her.    Pertinent History Patient was playing soccer, jumped up and landed and heard a pop. ACL surgery was the 21st of July. Things at home have been going OK but at one point MD thought there might have been a blood clot, which did come back negative. Was taken off of CPM due to blood clot concern and to her knowledge is not going to be put back on CPM.    Currently in Pain? No/denies  0/10 at rest, 4/10 pain when walking                          [facility]/Exercise - 06/27/15 0001    Knee/Hip Exercises: Stretches   Active Hamstring Stretch Both;3 reps;30 seconds   Quad Stretch Both;3 reps;30 seconds   Piriformis Stretch 3 reps;30 seconds   Piriformis Stretch Limitations seated    Gastroc Stretch 3 reps;30 seconds   Knee/Hip Exercises: Psychiatristtanding   Rocker Board --   Rocker Board Limitations --   Walking with Sports Cord 4 way hip on BOSU x10 each side, 3 second holds    Knee/Hip Exercises: Supine   Straight Leg Raises Both;1 set;15 reps   Straight Leg Raises Limitations 3#   Knee/Hip Exercises: Sidelying   Hip ABduction Both;1 set;15 reps   Hip ABduction Limitations 3#  Knee/Hip Exercises: Prone   Hip Extension Both;1 set;15 reps   Hip Extension Limitations 3#   Manual Therapy   Manual Therapy Edema management;Joint mobilization   Manual therapy comments performed separtely from other skilled interventions                 PT Education - 06/27/15 0838    Education provided No   Person(s) Educated Patient   Methods Explanation   Comprehension Verbalized understanding          PT Short Term Goals - 05/25/15 1249    PT SHORT TERM GOAL #1   Title Patient will demonstrate R knee ROM of 0-120 degrees with no pain   Baseline 05/25/15 0-131   Status Achieved   PT SHORT TERM GOAL #2   Title Patient will  demonstrate at least 4-/5 strength in R knee and at least 4+/5 strength in L LE    Status Achieved   PT SHORT TERM GOAL #3   Title Patient to be ambulating unlimited distances without crutches and WBAT R LE, equal step lengths, equal weight bearing each side, minimal unsteadiness, pain no more than 1/10   Status Achieved   PT SHORT TERM GOAL #4   Title Patient will experience 0/10 pain R knee  with all functional weight bearing tasks and exercises    Status Achieved   PT SHORT TERM GOAL #5   Title Patient to be independent in consistently and correctly performing  appropriate HEP, to be updated PRN    Status Achieved           PT Long Term Goals - 05/25/15 1250    PT LONG TERM GOAL #1   Title Patient to have 0 degrees extension  and 130 degrees flexion R knee, pain 0/10    Baseline 05/25/15,  0-131   Status Achieved   PT LONG TERM GOAL #2   Title Patient will be able to perform single leg squat to floor with R knee, minimal knee valgus, and pain 0/10   Baseline 05/25/15 patient able to perform to approximately 1/2 distance.    Time 6   Period Weeks   Status On-going   PT LONG TERM GOAL #3   Title Patient will be able to perform single leg hops of equal distance with bilateral lower extremities and pain 0/10 R knee    Baseline 05/25/15. Rt 92 cm, Lt 94 cm.    Time 6   Status On-going   PT LONG TERM GOAL #4   Title Patient to be able to jump off of at least 14 inch box and land on bilateral feet with good technique, good knee positioning with minimal knee valgus, and pain R knee 0/10   Status Achieved   PT LONG TERM GOAL #5   Title Patient to be able to maintain single leg stance on BOSU for at least 30 seconds with mild to moderate perturbations, pain R knee 0/10   Baseline 05/25/15. Able to hold for 30 seconds with poor stability.    Period Weeks   Status On-going   PT LONG TERM GOAL #6   Title Patient will demonstrate the ability to safely ascend and descend full height ladder and  crawl around obstacles with pain R knee 0/10   Baseline not tested   Time 6   Period Weeks   Status On-going   PT LONG TERM GOAL #7   Title Pt will ambulate on uneven surface carrying 50 lbs in backpack to  simulate [employer] training exercise.    Baseline Using between 31-38 pounds currently. 05/25/15   Time 6   Period Weeks   Status On-going               Plan - 06/27/15 0804    Clinical Impression Statement Patient arrives reporting that she is having increased pain inher knee with walking due to being up and down and on her feet most of the weekend, has been swelling up too. Adjusted PT treatment session today to account for pain and edema, incorpoating more functional stretches and manual today; performed functional exercises (including weighted hip strengthening on table due to status of knee today) with caution and to tolerance today as well. Recommend returning to normal activities next session if patient can tolerate/pain and edema improved or resolved.    Pt will benefit from skilled therapeutic intervention in order to improve on the following deficits Abnormal gait;Decreased coordination;Decreased range of motion;Difficulty walking;Impaired tone;Decreased safety awareness;Decreased activity tolerance;Pain;Decreased balance;Impaired flexibility;Improper body mechanics;Decreased mobility;Decreased strength;Increased edema;Postural dysfunction   Rehab Potential Excellent   PT Frequency 2x / week   PT Duration 4 weeks   PT Treatment/Interventions ADLs/Self Care Home Management;Cryotherapy;Electrical Stimulation;DME Instruction;Gait training;Stair training;Functional mobility training;Therapeutic activities;Therapeutic exercise;Balance training;Neuromuscular re-education;Patient/family education;Manual techniques;Passive range of motion   PT Next Visit Plan Re-assess next session. continue functional strengthening for R LE, continue to progress towards increasing strength and  functional ability to prep for return to [employer]    PT Home Exercise Plan Continue per POC with focus on CKC quad/HS strengthening, R SLS stabilization activities, and manual techniques to improve R knee jt mobility, addition of SL step down with green TB    Consulted and Agree with Plan of Care Patient        Problem List There are no active problems to display for this patient.   Nedra Hai PT, DPT [phone]  [facility] [address] [place], [place], [address] Phone: [phone]   Fax:  [phone]  Name: Markeria Goetsch MRN: [id number] Date of Birth: November 20, 1990

## 2015-06-30 ENCOUNTER — Ambulatory Visit (HOSPITAL_COMMUNITY): Payer: Federal, State, Local not specified - PPO | Admitting: Physical Therapy

## 2015-07-01 ENCOUNTER — Ambulatory Visit (HOSPITAL_COMMUNITY): Payer: Federal, State, Local not specified - PPO | Admitting: Physical Therapy

## 2015-07-01 DIAGNOSIS — R2681 Unsteadiness on feet: Secondary | ICD-10-CM | POA: Diagnosis not present

## 2015-07-01 DIAGNOSIS — M6281 Muscle weakness (generalized): Secondary | ICD-10-CM

## 2015-07-01 DIAGNOSIS — M25561 Pain in right knee: Secondary | ICD-10-CM | POA: Diagnosis not present

## 2015-07-01 DIAGNOSIS — R262 Difficulty in walking, not elsewhere classified: Secondary | ICD-10-CM

## 2015-07-01 DIAGNOSIS — M25661 Stiffness of right knee, not elsewhere classified: Secondary | ICD-10-CM

## 2015-07-01 DIAGNOSIS — Z9889 Other specified postprocedural states: Secondary | ICD-10-CM | POA: Diagnosis not present

## 2015-07-01 NOTE — Addendum Note (Signed)
Addended by: Bella KennedyUSSELL, CYNTHIA J on: 07/01/2015 04:31 PM   Modules accepted: Orders

## 2015-07-01 NOTE — Therapy (Signed)
[facility] [address] [place], [place], [address] Phone: [phone]   Fax:  [phone]  Physical Therapy Treatment  Patient Details  Name: Ariel Parker MRN: [id number] Date of Birth: Mar 21, 1991 Referring Provider: Dr. Orie Fisherman  Encounter Date: 07/01/2015      PT End of Session - 07/01/15 1424    Visit Number [id number]   Number of Visits 77   Date for PT Re-Evaluation 07/20/15   Authorization Type BCBS/ [employer] secondary (PROGRESS NOTE MUST BE DONE WEEKLY FOR MILITARY, give to patient each Friday)   PT Start Time 1347   PT Stop Time 1431   PT Time Calculation (min) 44 min   Activity Tolerance Patient tolerated treatment well   Behavior During Therapy [facility] for tasks assessed/performed      Past Medical History  Diagnosis Date  . Disruption of anterior cruciate ligament of right knee 09/2014    Past Surgical History  Procedure Laterality Date  . Wisdom tooth extraction    . Knee arthroscopy w/ acl reconstruction Left 07/17/2006  . Knee arthroscopy with anterior cruciate ligament (acl) repair with hamstring graft Right 10/14/2014    Procedure: RIGHT KNEE ARTHROSCOPY WITH ANTERIOR CRUCIATE LIGAMENT (ACL) REPAIR;  Surgeon: Mckinley Jewel, MD;  Location: [facility];  Service: Orthopedics;  Laterality: Right;  . Knee arthroscopy with lateral menisectomy Right 10/14/2014    Procedure: KNEE ARTHROSCOPY WITH LATERAL MENISECTOMY;  Surgeon: Mckinley Jewel, MD;  Location: [facility];  Service: Orthopedics;  Laterality: Right;    There were no vitals filed for this visit.      Subjective Assessment - 07/01/15 1359    Subjective Pt recieved a cortisone shot in her good leg; it's feeling better                  [facility] Adult PT Treatment/Exercise - 07/01/15 0001    Knee/Hip Exercises: Stretches   Active Hamstring Stretch Both;2 reps   Active Hamstring Stretch Limitations long sit    Quad Stretch Both;2  reps;60 seconds   Knee/Hip Exercises: Aerobic   Elliptical 5   Tread Mill --   Knee/Hip Exercises: Machines for Strengthening   Cybex Knee Extension 1 set 4PL x 10; 1 set 5 pl x 10     Cybex Knee Flexion 1 set 4Pl x 10; 1 set 6 PL x 10    Knee/Hip Exercises: Standing   Heel Raises Right;20 reps   Lateral Step Up Right;10 reps;Step Height: 8"   Forward Step Up Right;10 reps;Step Height: 8"   Wall Squat 10 reps   Wall Squat Limitations Rt only    Balance Poses   Warrior III Time x2                   PT Short Term Goals - 05/25/15 1249    PT SHORT TERM GOAL #1   Title Patient will demonstrate R knee ROM of 0-120 degrees with no pain   Baseline 05/25/15 0-131   Status Achieved   PT SHORT TERM GOAL #2   Title Patient will demonstrate at least 4-/5 strength in R knee and at least 4+/5 strength in L LE    Status Achieved   PT SHORT TERM GOAL #3   Title Patient to be ambulating unlimited distances without crutches and WBAT R LE, equal step lengths, equal weight bearing each side, minimal unsteadiness, pain no more than 1/10   Status Achieved   PT SHORT TERM GOAL #  4   Title Patient will experience 0/10 pain R knee  with all functional weight bearing tasks and exercises    Status Achieved   PT SHORT TERM GOAL #5   Title Patient to be independent in consistently and correctly performing  appropriate HEP, to be updated PRN    Status Achieved           PT Long Term Goals - 05/25/15 1250    PT LONG TERM GOAL #1   Title Patient to have 0 degrees extension  and 130 degrees flexion R knee, pain 0/10    Baseline 05/25/15,  0-131   Status Achieved   PT LONG TERM GOAL #2   Title Patient will be able to perform single leg squat to floor with R knee, minimal knee valgus, and pain 0/10   Baseline 05/25/15 patient able to perform to approximately 1/2 distance.    Time 6   Period Weeks   Status On-going   PT LONG TERM GOAL #3   Title Patient will be able to perform single leg  hops of equal distance with bilateral lower extremities and pain 0/10 R knee    Baseline 05/25/15. Rt 92 cm, Lt 94 cm.    Time 6   Status On-going   PT LONG TERM GOAL #4   Title Patient to be able to jump off of at least 14 inch box and land on bilateral feet with good technique, good knee positioning with minimal knee valgus, and pain R knee 0/10   Status Achieved   PT LONG TERM GOAL #5   Title Patient to be able to maintain single leg stance on BOSU for at least 30 seconds with mild to moderate perturbations, pain R knee 0/10   Baseline 05/25/15. Able to hold for 30 seconds with poor stability.    Period Weeks   Status On-going   PT LONG TERM GOAL #6   Title Patient will demonstrate the ability to safely ascend and descend full height ladder and crawl around obstacles with pain R knee 0/10   Baseline not tested   Time 6   Period Weeks   Status On-going   PT LONG TERM GOAL #7   Title Pt will ambulate on uneven surface carrying 50 lbs in backpack to simulate [employer] training exercise.    Baseline Using between 31-38 pounds currently. 05/25/15   Time 6   Period Weeks   Status On-going               Plan - 07/01/15 1424    Clinical Impression Statement [place].  Pt is still having pressure in Lt quad where she recieved tha hydrocortisone therefore avoided plyometrics will resume next treatement.  Pt session focused on single leg sterngthening of right quad and hamstring musculatture with both closed and open chained activity.   Rehab Potential Excellent   PT Frequency 2x / week   PT Duration 4 weeks   PT Treatment/Interventions ADLs/Self Care Home Management;Cryotherapy;Electrical Stimulation;DME Instruction;Gait training;Stair training;Functional mobility training;Therapeutic activities;Therapeutic exercise;Balance training;Neuromuscular re-education;Patient/family education;Manual techniques;Passive range of motion   PT Home Exercise Plan add  plyometric exercises next treatment.      Patient will benefit from skilled therapeutic intervention in order to improve the following deficits and impairments:  Abnormal gait, Decreased coordination, Decreased range of motion, Difficulty walking, Impaired tone, Decreased safety awareness, Decreased activity tolerance, Pain, Decreased balance, Impaired flexibility, Improper body mechanics, Decreased mobility, Decreased strength, Increased edema,  Postural dysfunction  Visit Diagnosis: Pain in right knee  Stiffness of right knee, not elsewhere classified  Unsteadiness on feet  Muscle weakness (generalized)  Difficulty in walking, not elsewhere classified     Problem List There are no active problems to display for this patient.  Virgina Organ, PT CLT [phone] 07/01/2015, 3:24 PM  [facility] [address] [place], [place], [address] Phone: [phone]   Fax:  [phone]  Name: Ariel Parker MRN: [id number] Date of Birth: [birth year]

## 2015-07-05 ENCOUNTER — Encounter (HOSPITAL_COMMUNITY)

## 2015-07-08 ENCOUNTER — Encounter (HOSPITAL_COMMUNITY): Admitting: Physical Therapy

## 2015-07-12 ENCOUNTER — Telehealth (HOSPITAL_COMMUNITY): Payer: Self-pay

## 2015-07-12 ENCOUNTER — Encounter (HOSPITAL_COMMUNITY): Admitting: Physical Therapy

## 2015-07-12 NOTE — Telephone Encounter (Signed)
4/18 mom left a message that she had been sick all night, vomiting

## 2015-07-14 ENCOUNTER — Ambulatory Visit (HOSPITAL_COMMUNITY): Payer: Federal, State, Local not specified - PPO | Admitting: Physical Therapy

## 2015-07-14 DIAGNOSIS — R2681 Unsteadiness on feet: Secondary | ICD-10-CM

## 2015-07-14 DIAGNOSIS — M25661 Stiffness of right knee, not elsewhere classified: Secondary | ICD-10-CM

## 2015-07-14 DIAGNOSIS — M25561 Pain in right knee: Secondary | ICD-10-CM | POA: Diagnosis not present

## 2015-07-14 DIAGNOSIS — R262 Difficulty in walking, not elsewhere classified: Secondary | ICD-10-CM

## 2015-07-14 DIAGNOSIS — M6281 Muscle weakness (generalized): Secondary | ICD-10-CM

## 2015-07-14 DIAGNOSIS — Z9889 Other specified postprocedural states: Secondary | ICD-10-CM | POA: Diagnosis not present

## 2015-07-14 NOTE — Therapy (Signed)
[place], [place], [address] Phone: [phone]   Fax:  [phone]  Physical Therapy Treatment/Reassessment  Patient Details  Name: Ariel Parker MRN: [id number] Date of Birth: 07-20-1990 Referring Provider: Dr. Maretta Los  Encounter Date: 07/14/2015      PT End of Session - 07/14/15 0903    Visit Number 3   Number of Visits 60   Date for PT Re-Evaluation 07/20/15   Authorization Type BCBS/ [employer] secondary (PROGRESS NOTE MUST BE DONE WEEKLY FOR MILITARY, give to patient each Friday)   Authorization Time Period 07/01/15 to 08/14/15   PT Start Time 0815   PT Stop Time 0900   PT Time Calculation (min) 45 min   Activity Tolerance Patient tolerated treatment well   Behavior During Therapy [facility] for tasks assessed/performed      Past Medical History  Diagnosis Date  . Disruption of anterior cruciate ligament of right knee 09/2014    Past Surgical History  Procedure Laterality Date  . Wisdom tooth extraction    . Knee arthroscopy w/ acl reconstruction Left 07/17/2006  . Knee arthroscopy with anterior cruciate ligament (acl) repair with hamstring graft Right 10/14/2014    Procedure: RIGHT KNEE ARTHROSCOPY WITH ANTERIOR CRUCIATE LIGAMENT (ACL) REPAIR;  Surgeon: Kathryne Hitch, MD;  Location: [place];  Service: Orthopedics;  Laterality: Right;  . Knee arthroscopy with lateral menisectomy Right 10/14/2014    Procedure: KNEE ARTHROSCOPY WITH LATERAL MENISECTOMY;  Surgeon: Kathryne Hitch, MD;  Location: [place];  Service: Orthopedics;  Laterality: Right;    There were no vitals filed for this visit.      Subjective Assessment - 07/14/15 0821    Subjective Pt is doing good, no reports of pain. She feels she is doing better but doesn't quite feel confident enough to go back to full activity. She states her doctor is expecting her to continue with therapy until june.    Pertinent History  Patient was playing soccer, jumped up and landed and heard a pop. ACL surgery was the 21st of July. Things at home have been going OK but at one point MD thought there might have been a blood clot, which did come back negative. Was taken off of CPM due to blood clot concern and to her knowledge is not going to be put back on CPM.    How long can you sit comfortably? no limitations   Currently in Pain? No/denies                         [facility] Adult PT Treatment/Exercise - 07/14/15 0001    Knee/Hip Exercises: Machines for Strengthening   Cybex Knee Extension 1RM: Lt 85#; Rt 75#   Cybex Knee Flexion 1 RM: Lt 74.5#; Rt 70#   Knee/Hip Exercises: Plyometrics   Other Plyometric Exercises single leg hop x3 trials each: Rt 116.5cm, Lt 121cm (96% LSI)   Knee/Hip Exercises: Standing   Other Standing Knee Exercises half kneel to stand wit hRNT hip extension x5 reps each   Knee/Hip Exercises: Supine   Bridges 1 set;10 reps  alt knee extension                PT Education - 07/14/15 0902    Education provided Yes   Education Details discussed results of 1RM and functional testing; updated HEP    Person(s) Educated Patient   Methods Explanation;Demonstration   Comprehension Verbalized understanding;Returned demonstration  PT Short Term Goals - 07/14/15 0917    PT SHORT TERM GOAL #1   Title Patient will demonstrate R knee ROM of 0-120 degrees with no pain   Baseline 05/25/15 0-131   Status Achieved   PT SHORT TERM GOAL #2   Title Patient will demonstrate at least 4-/5 strength in R knee and at least 4+/5 strength in L LE    Status Achieved   PT SHORT TERM GOAL #3   Title Patient to be ambulating unlimited distances without crutches and WBAT R LE, equal step lengths, equal weight bearing each side, minimal unsteadiness, pain no more than 1/10   Status Achieved   PT SHORT TERM GOAL #4   Title Patient will experience 0/10 pain R knee  with all functional weight  bearing tasks and exercises    Status Achieved   PT SHORT TERM GOAL #5   Title Patient to be independent in consistently and correctly performing  appropriate HEP, to be updated PRN    Status Achieved           PT Long Term Goals - 07/14/15 0917    PT LONG TERM GOAL #1   Title Patient to have 0 degrees extension  and 130 degrees flexion R knee, pain 0/10    Baseline 05/25/15,  0-131   Status Achieved   PT LONG TERM GOAL #2   Title Patient will be able to perform single leg squat without valgus deviation or pain x5 trials.   Baseline 05/25/15 patient able to perform to approximately 1/2 distance.; 07/14/15 Pt demo knee valgus 1/5 trials.   Time 6   Period Weeks   Status Revised   PT LONG TERM GOAL #3   Title Patient will be able to perform single leg hops of equal distance with bilateral lower extremities and pain 0/10 R knee    Baseline 05/25/15. Rt 92 cm, Lt 94 cm.; 07/14/15 Rt 116.5cm, Lt 121cm (LSI 96%)   Time 6   Status Achieved   PT LONG TERM GOAL #4   Title Patient to be able to jump off of at least 14 inch box and land on bilateral feet with good technique, good knee positioning with minimal knee valgus, and pain R knee 0/10   Status Achieved   PT LONG TERM GOAL #5   Title Patient to be able to maintain single leg stance on BOSU for at least 30 seconds with mild to moderate perturbations, pain R knee 0/10   Baseline 05/25/15. Able to hold for 30 seconds with poor stability. 07/14/15: not tested   Period Weeks   Status On-going   Additional Long Term Goals   Additional Long Term Goals Yes   PT LONG TERM GOAL #6   Title Patient will demonstrate the ability to safely ascend and descend full height ladder and crawl around obstacles with pain R knee 0/10   Baseline not tested   Time 6   Period Weeks   Status On-going   PT LONG TERM GOAL #7   Title Pt will ambulate on uneven surface carrying 50 lbs in backpack to simulate [employer] training exercise.    Baseline Using between 31-38  pounds currently. 05/25/15   Time 6   Period Weeks   Status On-going   PT LONG TERM GOAL #8   Title Pt will demo improved stability and strength evident by her ability to return to running for atleast 10 minutes without report of pain or instability.   Time 2  Period Weeks   Status New               Plan - 07/14/15 0910    Clinical Impression Statement Pt was reassessed this session having met several of her remaining goals. She continues to progress in overall LE strength and functional performance. She demonstrates a limb symmetry index of atleast 96% during single leg hop testing and she demonstrates good stability with single leg squat and other activity during today's session. She has not returning to running or normal activity at this time secondary to concern with safety. Therapist reassured pt with the results of her functional testing. She didn't have her backpack with her today, but she tolerated progressions of activity without any pain or sign of instability. She would benefit from continued functional training with her backpack for improved stability and safety with return to work activity.   Rehab Potential Excellent   PT Frequency 2x / week   PT Duration 4 weeks   PT Treatment/Interventions ADLs/Self Care Home Management;Cryotherapy;Electrical Stimulation;DME Instruction;Gait training;Stair training;Functional mobility training;Therapeutic activities;Therapeutic exercise;Balance training;Neuromuscular re-education;Patient/family education;Manual techniques;Passive range of motion   PT Next Visit Plan begin trial of treadmill running if no noted valgus or assymetry; LE dynamic activity/uneven surfaces with walking, half kneel to stand, etc.   PT Home Exercise Plan updated bridge to bridge hold with alt knee ext   Consulted and Agree with Plan of Care Patient      Patient will benefit from skilled therapeutic intervention in order to improve the following deficits and  impairments:  Abnormal gait, Decreased coordination, Decreased range of motion, Difficulty walking, Impaired tone, Decreased safety awareness, Decreased activity tolerance, Pain, Decreased balance, Impaired flexibility, Improper body mechanics, Decreased mobility, Decreased strength, Increased edema, Postural dysfunction  Visit Diagnosis: Pain in right knee  Stiffness of right knee, not elsewhere classified  Unsteadiness on feet  Muscle weakness (generalized)  Difficulty in walking, not elsewhere classified     Problem List There are no active problems to display for this patient.   9:31 AM,07/14/2015 Elly Modena PT, DPT [facility] [place] [address] [place], [place], [address] Phone: [phone]   Fax:  [phone]  Name: Ariel Parker MRN: [id number] Date of Birth: [birth year]

## 2015-07-18 ENCOUNTER — Ambulatory Visit (HOSPITAL_COMMUNITY): Payer: Federal, State, Local not specified - PPO | Admitting: Physical Therapy

## 2015-07-18 DIAGNOSIS — Z9889 Other specified postprocedural states: Secondary | ICD-10-CM | POA: Diagnosis not present

## 2015-07-18 DIAGNOSIS — M25661 Stiffness of right knee, not elsewhere classified: Secondary | ICD-10-CM

## 2015-07-18 DIAGNOSIS — R262 Difficulty in walking, not elsewhere classified: Secondary | ICD-10-CM | POA: Diagnosis not present

## 2015-07-18 DIAGNOSIS — M6281 Muscle weakness (generalized): Secondary | ICD-10-CM | POA: Diagnosis not present

## 2015-07-18 DIAGNOSIS — M25561 Pain in right knee: Secondary | ICD-10-CM

## 2015-07-18 DIAGNOSIS — R2681 Unsteadiness on feet: Secondary | ICD-10-CM

## 2015-07-18 NOTE — Therapy (Signed)
[facility] [address] [place], [place], [address] Phone: [phone]   Fax:  [phone]  Physical Therapy Treatment  Patient Details  Name: Ariel Parker MRN: [id number] Date of Birth: [birth year] Referring Provider: Dr. Orie Fisherman  Encounter Date: 07/18/2015      PT End of Session - 07/18/15 0843    Visit Number 72   Number of Visits 77   Date for PT Re-Evaluation 07/20/15   Authorization Type BCBS/ [employer] secondary (PROGRESS NOTE MUST BE DONE WEEKLY FOR MILITARY, give to patient each Friday)   Authorization Time Period 07/01/15 to 08/14/15   PT Start Time 0817   PT Stop Time 0858   PT Time Calculation (min) 41 min   Activity Tolerance Patient tolerated treatment well   Behavior During Therapy [facility] for tasks assessed/performed      Past Medical History  Diagnosis Date  . Disruption of anterior cruciate ligament of right knee 09/2014    Past Surgical History  Procedure Laterality Date  . Wisdom tooth extraction    . Knee arthroscopy w/ acl reconstruction Left 07/17/2006  . Knee arthroscopy with anterior cruciate ligament (acl) repair with hamstring graft Right 10/14/2014    Procedure: RIGHT KNEE ARTHROSCOPY WITH ANTERIOR CRUCIATE LIGAMENT (ACL) REPAIR;  Surgeon: Mckinley Jewel, MD;  Location: [facility];  Service: Orthopedics;  Laterality: Right;  . Knee arthroscopy with lateral menisectomy Right 10/14/2014    Procedure: KNEE ARTHROSCOPY WITH LATERAL MENISECTOMY;  Surgeon: Mckinley Jewel, MD;  Location: [facility];  Service: Orthopedics;  Laterality: Right;    There were no vitals filed for this visit.      Subjective Assessment - 07/18/15 0823    Subjective Pt states she is doing ok today. States she hurt her foot yesterday and today has noticed 5/10 sharp pain along her 5th metatarsal when walking or weight bearing. She denied swelling but feels it is worse since initail onset.    Pertinent History  Patient was playing soccer, jumped up and landed and heard a pop. ACL surgery was the 21st of July. Things at home have been going OK but at one point MD thought there might have been a blood clot, which did come back negative. Was taken off of CPM due to blood clot concern and to her knowledge is not going to be put back on CPM.    Currently in Pain? Yes   Pain Score 5    Pain Location Foot   Pain Orientation Left   Pain Descriptors / Indicators Sharp   Pain Type Acute pain   Pain Onset Yesterday   Pain Frequency Other (Comment)  during weight bearing activity   Aggravating Factors  walking, foot eversion, weight bearing   Pain Relieving Factors none                         [facility] Adult PT Treatment/Exercise - 07/18/15 0001    Exercises   Exercises Other Exercises   Other Exercises  foward /side stepping on balance beam with step over large hurdles x3RT each, intermittent UE support     Knee/Hip Exercises: Machines for Strengthening   Cybex Knee Extension BLE: 60# 3x8 reps   Knee/Hip Exercises: Standing   Forward Step Up Right;1 set;5 reps;Hand Hold: 0  24" box   Other Standing Knee Exercises single leg dead liftwith 5# in contralat UE.  PT Education - 07/18/15 [id number]    Education provided Yes   Education Details reviewed HEP; discussed contacting her MD if foot pain worsens or doesn'y get better after several days   Person(s) Educated Patient   Methods Explanation;Demonstration   Comprehension Verbalized understanding;Returned demonstration          PT Short Term Goals - 07/14/15 0917    PT SHORT TERM GOAL #1   Title Patient will demonstrate R knee ROM of 0-120 degrees with no pain   Baseline 05/25/15 0-131   Status Achieved   PT SHORT TERM GOAL #2   Title Patient will demonstrate at least 4-/5 strength in R knee and at least 4+/5 strength in L LE    Status Achieved   PT SHORT TERM GOAL #3   Title Patient to be ambulating unlimited  distances without crutches and WBAT R LE, equal step lengths, equal weight bearing each side, minimal unsteadiness, pain no more than 1/10   Status Achieved   PT SHORT TERM GOAL #4   Title Patient will experience 0/10 pain R knee  with all functional weight bearing tasks and exercises    Status Achieved   PT SHORT TERM GOAL #5   Title Patient to be independent in consistently and correctly performing  appropriate HEP, to be updated PRN    Status Achieved           PT Long Term Goals - 07/14/15 0917    PT LONG TERM GOAL #1   Title Patient to have 0 degrees extension  and 130 degrees flexion R knee, pain 0/10    Baseline 05/25/15,  0-131   Status Achieved   PT LONG TERM GOAL #2   Title Patient will be able to perform single leg squat without valgus deviation or pain x5 trials.   Baseline 05/25/15 patient able to perform to approximately 1/2 distance.; 07/14/15 Pt demo knee valgus 1/5 trials.   Time 6   Period Weeks   Status Revised   PT LONG TERM GOAL #3   Title Patient will be able to perform single leg hops of equal distance with bilateral lower extremities and pain 0/10 R knee    Baseline 05/25/15. Rt 92 cm, Lt 94 cm.; 07/14/15 Rt 116.5cm, Lt 121cm (LSI 96%)   Time 6   Status Achieved   PT LONG TERM GOAL #4   Title Patient to be able to jump off of at least 14 inch box and land on bilateral feet with good technique, good knee positioning with minimal knee valgus, and pain R knee 0/10   Status Achieved   PT LONG TERM GOAL #5   Title Patient to be able to maintain single leg stance on BOSU for at least 30 seconds with mild to moderate perturbations, pain R knee 0/10   Baseline 05/25/15. Able to hold for 30 seconds with poor stability. 07/14/15: not tested   Period Weeks   Status On-going   Additional Long Term Goals   Additional Long Term Goals Yes   PT LONG TERM GOAL #6   Title Patient will demonstrate the ability to safely ascend and descend full height ladder and crawl around  obstacles with pain R knee 0/10   Baseline not tested   Time 6   Period Weeks   Status On-going   PT LONG TERM GOAL #7   Title Pt will ambulate on uneven surface carrying 50 lbs in backpack to simulate [employer] training exercise.    Baseline Using  between 31-38 pounds currently. 05/25/15   Time 6   Period Weeks   Status On-going   PT LONG TERM GOAL #8   Title Pt will demo improved stability and strength evident by her ability to return to running for atleast 10 minutes without report of pain or instability.   Time 2   Period Weeks   Status New               Plan - 07/18/15 [id number]    Clinical Impression Statement Pt arriving with new onset Lt foot pain which limited her ability to perform weight bearing activity without sharp pain. Session was modified within pt's pain tolerance. Session focused on quad and hamstring therex to improve strength/symmetry between L and R. Also worked on proprioception activity while wearing military pack to mimic hiking with pt demonstrating good knee control throughout and requiring only intermittent UE support. Therapist encouraged pt to contact her MD if foot pain worsens or if no improvement by Thursday. Will continue with current POC.    Rehab Potential Excellent   PT Frequency 2x / week   PT Duration 4 weeks   PT Treatment/Interventions ADLs/Self Care Home Management;Cryotherapy;Electrical Stimulation;DME Instruction;Gait training;Stair training;Functional mobility training;Therapeutic activities;Therapeutic exercise;Balance training;Neuromuscular re-education;Patient/family education;Manual techniques;Passive range of motion   PT Next Visit Plan Measure triple hop; begin trial of treadmill running; LE dynamic activity/uneven surfaces with walking, half kneel to stand, etc.   PT Home Exercise Plan no updates this session   Consulted and Agree with Plan of Care Patient      Patient will benefit from skilled therapeutic intervention in order to  improve the following deficits and impairments:  Abnormal gait, Decreased coordination, Decreased range of motion, Difficulty walking, Impaired tone, Decreased safety awareness, Decreased activity tolerance, Pain, Decreased balance, Impaired flexibility, Improper body mechanics, Decreased mobility, Decreased strength, Increased edema, Postural dysfunction  Visit Diagnosis: Pain in right knee  Stiffness of right knee, not elsewhere classified  Unsteadiness on feet  Muscle weakness (generalized)  Difficulty in walking, not elsewhere classified     Problem List There are no active problems to display for this patient.   9:02 AM,07/18/2015 Marylyn IshiharaSara Kiser PT, DPT [facility] [phone]  [facility] [address] [place], [place], [address] Phone: [phone]   Fax:  [phone]  Name: Ariel Parker MRN: [id number] Date of Birth: May 29, 1990

## 2015-07-21 ENCOUNTER — Ambulatory Visit (HOSPITAL_COMMUNITY): Payer: Federal, State, Local not specified - PPO | Admitting: Physical Therapy

## 2015-07-21 DIAGNOSIS — M6281 Muscle weakness (generalized): Secondary | ICD-10-CM | POA: Diagnosis not present

## 2015-07-21 DIAGNOSIS — M25561 Pain in right knee: Secondary | ICD-10-CM

## 2015-07-21 DIAGNOSIS — M25661 Stiffness of right knee, not elsewhere classified: Secondary | ICD-10-CM | POA: Diagnosis not present

## 2015-07-21 DIAGNOSIS — Z9889 Other specified postprocedural states: Secondary | ICD-10-CM | POA: Diagnosis not present

## 2015-07-21 DIAGNOSIS — R262 Difficulty in walking, not elsewhere classified: Secondary | ICD-10-CM | POA: Diagnosis not present

## 2015-07-21 DIAGNOSIS — R2681 Unsteadiness on feet: Secondary | ICD-10-CM | POA: Diagnosis not present

## 2015-07-21 NOTE — Therapy (Signed)
[facility] [address] [place], [place], [address] Phone: [phone]   Fax:  [phone]  Physical Therapy Treatment  Patient Details  Name: Ariel Parker MRN: [id number] Date of Birth: 1990/09/03 Referring Provider: Dr. Orie Fisherman  Encounter Date: 07/21/2015      PT End of Session - 07/21/15 0838    Visit Number 73   Number of Visits 77   Date for PT Re-Evaluation 08/14/15   Authorization Type BCBS/ Tricare secondary (PROGRESS NOTE MUST BE DONE WEEKLY FOR MILITARY, give to patient each Friday)   PT Start Time 0820   PT Stop Time 0900   PT Time Calculation (min) 40 min   Behavior During Therapy [facility] for tasks assessed/performed      Past Medical History  Diagnosis Date  . Disruption of anterior cruciate ligament of right knee 09/2014    Past Surgical History  Procedure Laterality Date  . Wisdom tooth extraction    . Knee arthroscopy w/ acl reconstruction Left 07/17/2006  . Knee arthroscopy with anterior cruciate ligament (acl) repair with hamstring graft Right 10/14/2014    Procedure: RIGHT KNEE ARTHROSCOPY WITH ANTERIOR CRUCIATE LIGAMENT (ACL) REPAIR;  Surgeon: Mckinley Jewel, MD;  Location: [facility];  Service: Orthopedics;  Laterality: Right;  . Knee arthroscopy with lateral menisectomy Right 10/14/2014    Procedure: KNEE ARTHROSCOPY WITH LATERAL MENISECTOMY;  Surgeon: Mckinley Jewel, MD;  Location:  SUR[facility]ERY C[facility];  Service: Orthopedics;  Laterality: Right;    There were no vitals filed for this visit.      Subjective Assessment - 07/21/15 0822    Subjective Pt is scared that she will get in trouble if she stops therapy.  Pt states that she has been doing her exercises at home but has not been to the [facility] yet.     Pertinent History Patient was playing soccer, jumped up and landed and heard a pop. ACL surgery was the 21st of July. Things at home have been going OK but at one point MD thought there might  have been a blood clot, which did come back negative. Was taken off of CPM due to blood clot concern and to her knowledge is not going to be put back on CPM.    How long can you sit comfortably? no limitations   How long can you stand comfortably? 4+ hours   How long can you walk comfortably? 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 No/denies                 [facility] Adult PT Treatment/Exercise - 07/21/15 0001    Knee/Hip Exercises: Machines for Strengthening   Cybex Knee Extension Rt 40# x 10    Cybex Knee Flexion Rt 40 # x10   Knee/Hip Exercises: Plyometrics   Other Plyometric Exercises --   Knee/Hip Exercises: Standing   Lunge Walking - Round Trips 2 RT    Other Standing Knee Exercises mountain climbers 2x 20 reps cueing for knee alignment; burpees x 100 ft   Other Standing Knee Exercises kneel to stand with pack onx 20    Knee/Hip Exercises: Seated   Sit to Sand 10 reps  Rt leg only                   PT Short Term Goals - 07/14/15 0917    PT SHORT TERM  GOAL #1   Title Patient will demonstrate R knee ROM of 0-120 degrees with no pain   Baseline 05/25/15 0-131   Status Achieved   PT SHORT TERM GOAL #2   Title Patient will demonstrate at least 4-/5 strength in R knee and at least 4+/5 strength in L LE    Status Achieved   PT SHORT TERM GOAL #3   Title Patient to be ambulating unlimited distances without crutches and WBAT R LE, equal step lengths, equal weight bearing each side, minimal unsteadiness, pain no more than 1/10   Status Achieved   PT SHORT TERM GOAL #4   Title Patient will experience 0/10 pain R knee  with all functional weight bearing tasks and exercises    Status Achieved   PT SHORT TERM GOAL #5   Title Patient to be independent in consistently and correctly performing  appropriate HEP, to be updated PRN    Status Achieved            PT Long Term Goals - 07/14/15 0917    PT LONG TERM GOAL #1   Title Patient to have 0 degrees extension  and 130 degrees flexion R knee, pain 0/10    Baseline 05/25/15,  0-131   Status Achieved   PT LONG TERM GOAL #2   Title Patient will be able to perform single leg squat without valgus deviation or pain x5 trials.   Baseline 05/25/15 patient able to perform to approximately 1/2 distance.; 07/14/15 Pt demo knee valgus 1/5 trials.   Time 6   Period Weeks   Status Revised   PT LONG TERM GOAL #3   Title Patient will be able to perform single leg hops of equal distance with bilateral lower extremities and pain 0/10 R knee    Baseline 05/25/15. Rt 92 cm, Lt 94 cm.; 07/14/15 Rt 116.5cm, Lt 121cm (LSI 96%)   Time 6   Status Achieved   PT LONG TERM GOAL #4   Title Patient to be able to jump off of at least 14 inch box and land on bilateral feet with good technique, good knee positioning with minimal knee valgus, and pain R knee 0/10   Status Achieved   PT LONG TERM GOAL #5   Title Patient to be able to maintain single leg stance on BOSU for at least 30 seconds with mild to moderate perturbations, pain R knee 0/10   Baseline 05/25/15. Able to hold for 30 seconds with poor stability. 07/14/15: not tested   Period Weeks   Status On-going   Additional Long Term Goals   Additional Long Term Goals Yes   PT LONG TERM GOAL #6   Title Patient will demonstrate the ability to safely ascend and descend full height ladder and crawl around obstacles with pain R knee 0/10   Baseline not tested   Time 6   Period Weeks   Status On-going   PT LONG TERM GOAL #7   Title Pt will ambulate on uneven surface carrying 50 lbs in backpack to simulate [employer] training exercise.    Baseline Using between 31-38 pounds currently. 05/25/15   Time 6   Period Weeks   Status On-going   PT LONG TERM GOAL #8   Title Pt will demo improved stability and strength evident by her ability to return to running for atleast 10 minutes  without report of pain or instability.   Time 2   Period Weeks   Status New  Plan - 07/21/15 [id number]    Clinical Impression Statement Noted decreased UE and core strength with workout.  Explained to pt that she needed to do more at home as failure in [employer] was not just her knee but whole body strength and this was the patients's responsibility.  Pt verbalized understanding.    PT Frequency 2x / week   PT Duration 4 weeks   PT Treatment/Interventions ADLs/Self Care Home Management;Cryotherapy;Electrical Stimulation;DME Instruction;Gait training;Stair training;Functional mobility training;Therapeutic activities;Therapeutic exercise;Balance training;Neuromuscular re-education;Patient/family education;Manual techniques;Passive range of motion   PT Next Visit Plan Pt to have weekly one max rep.  Encourage pt to work on upper body and core sterngth as well as cardiovascular at home as patient is not working and has plenty of time to do this.       Patient will benefit from skilled therapeutic intervention in order to improve the following deficits and impairments:  Abnormal gait, Decreased coordination, Decreased range of motion, Difficulty walking, Impaired tone, Decreased safety awareness, Decreased activity tolerance, Pain, Decreased balance, Impaired flexibility, Improper body mechanics, Decreased mobility, Decreased strength, Increased edema, Postural dysfunction  Visit Diagnosis: Pain in right knee  Stiffness of right knee, not elsewhere classified  Unsteadiness on feet  Muscle weakness (generalized)  Difficulty in walking, not elsewhere classified     Problem List There are no active problems to display for this patient. Virgina OrganCynthia Russell, PT CLT [phone] 07/21/2015, 9:02 AM  [facility] [address] [place], [place], [address] Phone: [phone]   Fax:  [phone]  Name: Hiram Gashrista Chittenden MRN:  [id number] Date of Birth: 1991/01/23

## 2015-07-26 ENCOUNTER — Encounter (HOSPITAL_COMMUNITY): Admitting: Physical Therapy

## 2015-07-27 ENCOUNTER — Ambulatory Visit (HOSPITAL_COMMUNITY): Payer: Federal, State, Local not specified - PPO | Admitting: Physical Therapy

## 2015-07-28 ENCOUNTER — Encounter (HOSPITAL_COMMUNITY): Payer: Self-pay | Admitting: Physical Therapy

## 2015-07-28 ENCOUNTER — Encounter (HOSPITAL_COMMUNITY): Admitting: Physical Therapy

## 2015-07-28 ENCOUNTER — Ambulatory Visit (HOSPITAL_COMMUNITY): Payer: Federal, State, Local not specified - PPO | Attending: Orthopedic Surgery | Admitting: Physical Therapy

## 2015-07-28 DIAGNOSIS — R2681 Unsteadiness on feet: Secondary | ICD-10-CM | POA: Insufficient documentation

## 2015-07-28 DIAGNOSIS — R262 Difficulty in walking, not elsewhere classified: Secondary | ICD-10-CM | POA: Insufficient documentation

## 2015-07-28 DIAGNOSIS — M25661 Stiffness of right knee, not elsewhere classified: Secondary | ICD-10-CM

## 2015-07-28 DIAGNOSIS — M6281 Muscle weakness (generalized): Secondary | ICD-10-CM

## 2015-07-28 DIAGNOSIS — M25561 Pain in right knee: Secondary | ICD-10-CM | POA: Insufficient documentation

## 2015-07-28 NOTE — Therapy (Signed)
[place], [place], [address] Phone: [phone]   Fax:  [phone]  Physical Therapy Treatment/Discharge  Patient Details  Name: Ariel Parker MRN: [id number] Date of Birth: 09-20-1990 Referring Provider: Percell Miller  Encounter Date: 07/28/2015      PT End of Session - 07/28/15 0949    Visit Number 61   Number of Visits 37   Date for PT Re-Evaluation 08/14/15   Authorization Type BCBS/ Tricare secondary (PROGRESS NOTE MUST BE DONE WEEKLY FOR MILITARY, give to patient each Friday)   PT Start Time 0901   PT Stop Time 0940   PT Time Calculation (min) 39 min   Activity Tolerance Patient tolerated treatment well   Behavior During Therapy [facility] for tasks assessed/performed      Past Medical History  Diagnosis Date  . Disruption of anterior cruciate ligament of right knee 09/2014    Past Surgical History  Procedure Laterality Date  . Wisdom tooth extraction    . Knee arthroscopy w/ acl reconstruction Left 07/17/2006  . Knee arthroscopy with anterior cruciate ligament (acl) repair with hamstring graft Right 10/14/2014    Procedure: RIGHT KNEE ARTHROSCOPY WITH ANTERIOR CRUCIATE LIGAMENT (ACL) REPAIR;  Surgeon: Kathryne Hitch, MD;  Location: [facility];  Service: Orthopedics;  Laterality: Right;  . Knee arthroscopy with lateral menisectomy Right 10/14/2014    Procedure: KNEE ARTHROSCOPY WITH LATERAL MENISECTOMY;  Surgeon: Kathryne Hitch, MD;  Location: [facility];  Service: Orthopedics;  Laterality: Right;    There were no vitals filed for this visit.      Subjective Assessment - 07/28/15 0905    Subjective Pt states she is doing good. She spoke to her MD who states she does not have to be in therapy for a year like she initially thought and agrees that she is doing well. Overall, she feels she has made 90% improvement since beginning therapy   Pertinent History Patient was playing soccer,  jumped up and landed and heard a pop. ACL surgery was the 21st of July. Things at home have been going OK but at one point MD thought there might have been a blood clot, which did come back negative. Was taken off of CPM due to blood clot concern and to her knowledge is not going to be put back on CPM.    How long can you sit comfortably? unlimited   How long can you stand comfortably? unlimited   How long can you walk comfortably? 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 No/denies            [facility] PT Assessment - 07/28/15 0001    Assessment   Medical Diagnosis s/p Rt ACL repair    Referring Provider Murphy   Onset Date/Surgical Date 10/14/14   Next MD Visit 07/26/15   Precautions   Precautions None   Restrictions   Weight Bearing Restrictions No   Balance Screen   Has the patient fallen in the past 6 months No   Has the patient had a decrease in activity level because of a fear of falling?  No   Is the patient reluctant to leave their home because of a fear of falling?  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    AROM   Right Knee Extension 0   Right Knee Flexion 126   Strength   Right Hip Flexion 5/5   Right Hip Extension 5/5   Right Hip ABduction 5/5   Left Hip Flexion 5/5   Left Hip Extension 5/5   Left Hip ABduction 5/5   Right Knee Flexion 5/5   Right Knee Extension 5/5   Left Knee Flexion 5/5   Left Knee Extension 5/5   Ambulation/Gait   Gait Comments No deviations noted                     [facility] Adult PT Treatment/Exercise - 07/28/15 0001    Exercises   Other Exercises  Treadmill: x70mn _0 .351m Nordic hamstring curls facing wall x5 reps with  UE support   Knee/Hip Exercises: Stretches   QuSports administratoroth;5 reps;10 seconds  with contralateral LE mini squat   Hip Flexor Stretch 3 reps;30 seconds   Hip Flexor Stretch Limitations in half kneel   Knee/Hip Exercises: Standing   Other Standing Knee Exercises single leg squat with heel lifted x10 each   Other Standing Knee Exercises RLE standing on bosu with perturbations x40 sec   Knee/Hip Exercises: Supine   Other Supine Knee/Hip Exercises Bridge hold with alt knee extension x10 reps                PT Education - 07/28/15 0948    Education provided Yes   Education Details final HEP; discussed d/c and goals met    Person(s) Educated Patient   Methods Explanation;Demonstration;Handout   Comprehension Verbalized understanding;Returned demonstration          PT Short Term Goals - 07/28/15 0928    PT SHORT TERM GOAL #1   Title Patient will demonstrate R knee ROM of 0-120 degrees with no pain   Baseline 05/25/15 0-131   Status Achieved   PT SHORT TERM GOAL #2   Title Patient will demonstrate at least 4-/5 strength in R knee and at least 4+/5 strength in L LE    Status Achieved   PT SHORT TERM GOAL #3   Title Patient to be ambulating unlimited distances without crutches and WBAT R LE, equal step lengths, equal weight bearing each side, minimal unsteadiness, pain no more than 1/10   Status Achieved   PT SHORT TERM GOAL #4   Title Patient will experience 0/10 pain R knee  with all functional weight bearing tasks and exercises    Status Achieved   PT SHORT TERM GOAL #5   Title Patient to be independent in consistently and correctly performing  appropriate HEP, to be updated PRN    Status Achieved           PT Long Term Goals - 07/28/15 [id number]  PT LONG TERM GOAL #1   Title Patient to have 0 degrees extension  and 130 degrees flexion R knee, pain 0/10    Baseline 05/25/15,  0-131   Status Achieved   PT LONG TERM GOAL #2   Title Patient will be able to perform  single leg squat without valgus deviation or pain x5 trials.   Baseline 05/25/15 patient able to perform to approximately 1/2 distance.; 07/14/15 Pt demo knee valgus 1/5 trials.; 07/28/15: x5 trials each without valgus deviation   Time 6   Period Weeks   Status Revised   PT LONG TERM GOAL #3  Title Patient will be able to perform single leg hops of equal distance with bilateral lower extremities and pain 0/10 R knee    Baseline 05/25/15. Rt 92 cm, Lt 94 cm.; 07/14/15 Rt 116.5cm, Lt 121cm (LSI 96%)   Time 6   Status Achieved   PT LONG TERM GOAL #4   Title Patient to be able to jump off of at least 14 inch box and land on bilateral feet with good technique, good knee positioning with minimal knee valgus, and pain R knee 0/10   Status Achieved   PT LONG TERM GOAL #5   Title Patient to be able to maintain single leg stance on BOSU for at least 30 seconds with mild to moderate perturbations, pain R knee 0/10   Baseline 05/25/15. Able to hold for 30 seconds with poor stability. 07/14/15: not tested. 07/28/15: RLE x40 sec with external perturbations without LOB   Period Weeks   Status Achieved   PT LONG TERM GOAL #6   Title Patient will demonstrate the ability to safely ascend and descend full height ladder and crawl around obstacles with pain R knee 0/10   Baseline not tested   Time 6   Period Weeks   Status Unable to assess   PT LONG TERM GOAL #7   Title Pt will ambulate on uneven surface carrying 50 lbs in backpack to simulate [employer] training exercise.    Baseline Using between 31-38 pounds currently. 05/25/15   Time 6   Period Weeks   Status Achieved   PT LONG TERM GOAL #8   Title Pt will demo improved stability and strength evident by her ability to return to running for atleast 10 minutes without report of pain or instability.   Time 2   Period Weeks   Status Achieved               Plan - 07/28/15 1127    Clinical Impression Statement Pt was reassessed this session having met all of  her updated short and long term goals. She has been pain free for quite some time and is performing work specific strengthening and stability activity with good hip/knee stability. She demonstrates symmetrical single leg hop distance as well as a previously tested 1RM that is above 82% for quad/hamstring ratio. She is currently consistent and independent with her HEP and therapist provided final advanced HEP with pt demonstrating good understanding of additional exercises. At this time, she does need to continue with skilled PT due to meeting all of her goals as well as improved LE strength, stability, balance and performance of job specific activity without issue/pain. Therapist discussed POC with pt and she is in agreement with d/c at this time.   Rehab Potential Excellent   PT Frequency 2x / week   PT Duration 4 weeks   PT Treatment/Interventions ADLs/Self Care Home Management;Cryotherapy;Electrical Stimulation;DME Instruction;Gait training;Stair training;Functional mobility training;Therapeutic activities;Therapeutic exercise;Balance training;Neuromuscular re-education;Patient/family education;Manual techniques;Passive range of motion   PT Next Visit Plan d/c   PT Home Exercise Plan final HEP: see pt instructions   Consulted and Agree with Plan of Care Patient      Patient will benefit from skilled therapeutic intervention in order to improve the following deficits and impairments:  Abnormal gait, Decreased coordination, Decreased range of motion, Difficulty walking, Impaired tone, Decreased safety awareness, Decreased activity tolerance, Pain, Decreased balance, Impaired flexibility, Improper body mechanics, Decreased mobility, Decreased strength, Increased edema, Postural dysfunction  Visit Diagnosis: Pain in right knee  Stiffness of  right knee, not elsewhere classified  Unsteadiness on feet  Muscle weakness (generalized)  Difficulty in walking, not elsewhere classified    PHYSICAL  THERAPY DISCHARGE SUMMARY  Visits from Start of Care: 73 Current functional level related to goals / functional outcomes: Pt with full ROM, 5/5 MMT, symmetrical functional jump testing, above 82% quad/hamstring ratio, good control of knee valgus during activity. See clinical impression above.   Remaining deficits: None   Education / Equipment: Final home management program Plan: Patient agrees to discharge.  Patient goals were met. Patient is being discharged due to meeting the stated rehab goals.  ?????       Problem List There are no active problems to display for this patient.  11:43 AM,07/28/2015 Elly Modena PT, DPT [facility] [place] [address] [place], [place], [address] Phone: [phone]   Fax:  [phone]  Name: Estie Sproule MRN: [id number] Date of Birth: 1990/08/26

## 2015-07-28 NOTE — Patient Instructions (Signed)
SINGLE LEG BRIDGE - MODIFIED  While lying on your back, raise your buttocks off the floor/bed into a bridge position.    Next straighten a leg so that only one leg is supporting your body. Then, return that leg back to the ground and change to the other side.      Try and maintain your pelvis level the entire time.  2x10    Single Leg Squat  Stand on one foot, maintaining your balance point.  Lower hips as far as possible with good form.  Do not wobble from side-to-side.  Return to standing position. Prop heel 2-3", keep knee from turning in. 2x10 each.     Nordic Hamstring Curls   While kneeling, have someone hold your ankles and lean forward slowly. Control your motion at the knee by contracting your hamstring. Come back up to the starting position and repeat.  2x10     HALF KNEEL HIP FLEXOR STRETCH  While kneeling, lean forward and bend your front knee until a stretch is felt along the front of the other hip. Hold 30 sec, repeat 3x      Standing Quad Stretch  Stand with chair in front of you.  Place back foot on armrest of couch or chair and lean forward.  Draw abs in to tilt pelvis back and slowly stand up straight until you feel a good stretch in the front of the thigh.  Mini squat on stance leg and hold 5 sec, repeat 5x.

## 2015-07-29 ENCOUNTER — Encounter (HOSPITAL_COMMUNITY): Payer: Federal, State, Local not specified - PPO | Admitting: Physical Therapy

## 2015-08-02 ENCOUNTER — Encounter (HOSPITAL_COMMUNITY): Admitting: Physical Therapy

## 2015-08-04 ENCOUNTER — Encounter (HOSPITAL_COMMUNITY): Admitting: Physical Therapy

## 2015-08-05 ENCOUNTER — Encounter (HOSPITAL_COMMUNITY): Admitting: Physical Therapy

## 2015-08-09 ENCOUNTER — Encounter (HOSPITAL_COMMUNITY): Admitting: Physical Therapy

## 2015-08-10 ENCOUNTER — Encounter (HOSPITAL_COMMUNITY): Admitting: Physical Therapy

## 2015-08-11 ENCOUNTER — Encounter (HOSPITAL_COMMUNITY): Admitting: Physical Therapy

## 2015-08-16 ENCOUNTER — Encounter (HOSPITAL_COMMUNITY): Admitting: Physical Therapy

## 2015-08-18 ENCOUNTER — Encounter (HOSPITAL_COMMUNITY): Admitting: Physical Therapy

## 2015-08-23 ENCOUNTER — Encounter (HOSPITAL_COMMUNITY): Admitting: Physical Therapy

## 2015-08-25 NOTE — Telephone Encounter (Signed)
No additional notes

## 2016-06-08 IMAGING — MR MR KNEE*R* W/O CM
4 of 6 series · 13 of 40 positions shown · non-contrast
Comparison: None.

CLINICAL DATA: Status post fall with a blow to the right knee 2
months ago. Continued pain. Initial encounter.

EXAM:
MRI OF THE RIGHT KNEE WITHOUT CONTRAST
TECHNIQUE: Multiplanar, multisequence MR imaging of the knee was performed. No
intravenous contrast was administered.

[Series 3: pdfs axial · axial · 3.0mm · 0.20mm/px · z∈[-44,+28]mm · 3 of 30 slices shown]
[im 5/30]
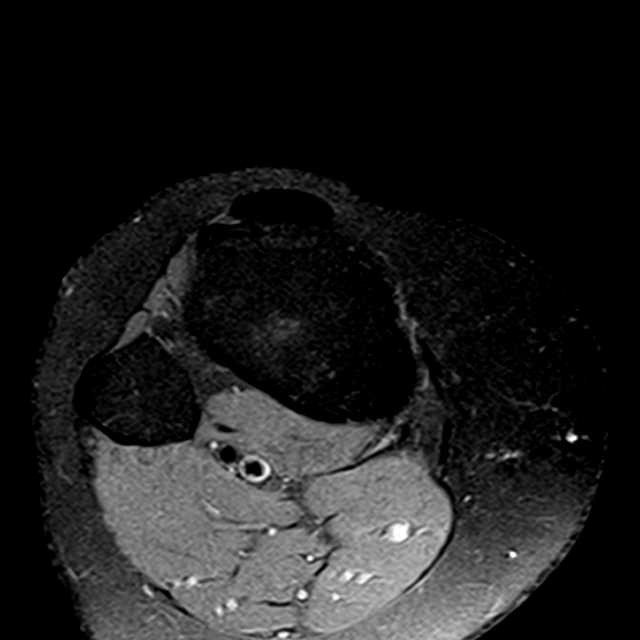
[im 15/30]
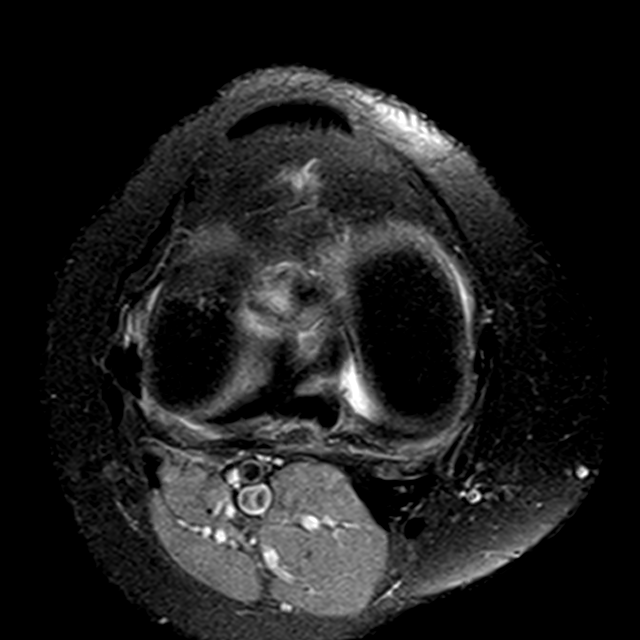
[im 25/30]
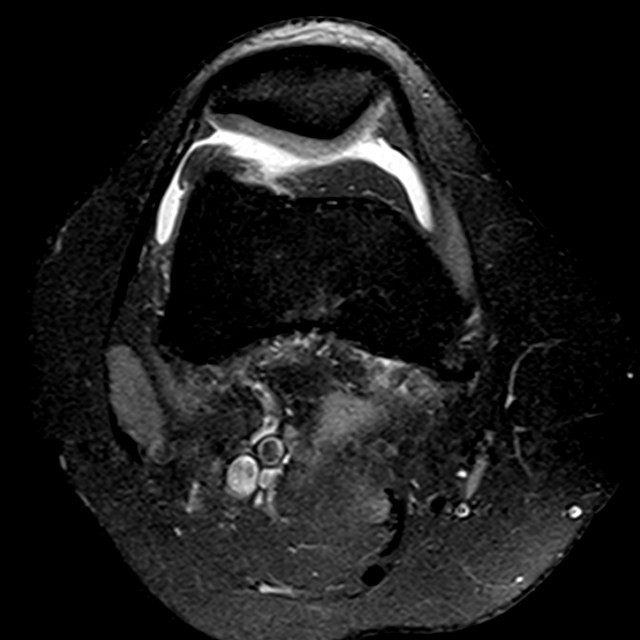

[Series 4: T1 · coronal · 3.0mm · 0.19mm/px · 4 of 26 slices shown]
[im 1/26]
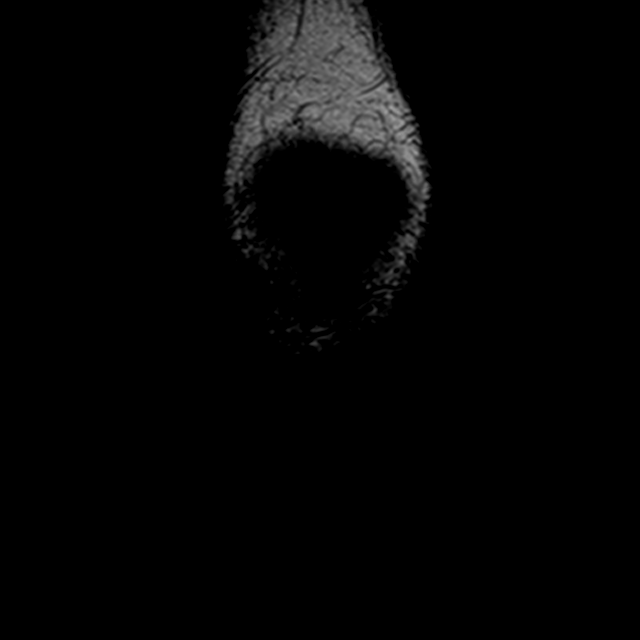
[im 5/26]
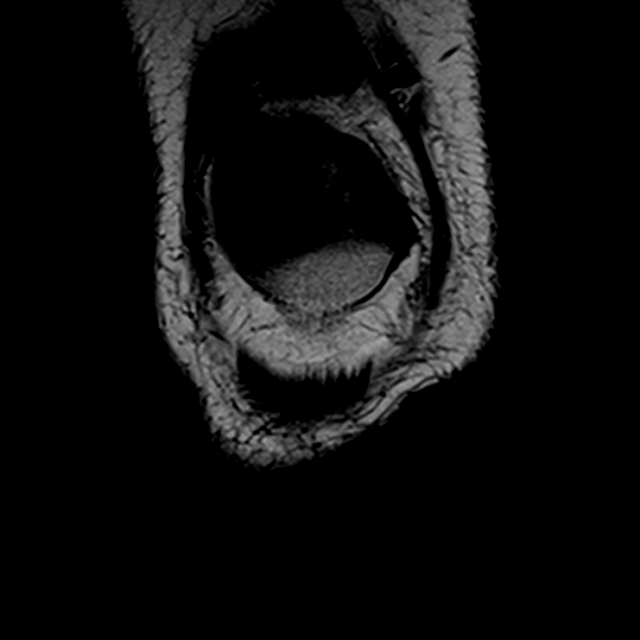
[im 13/26]
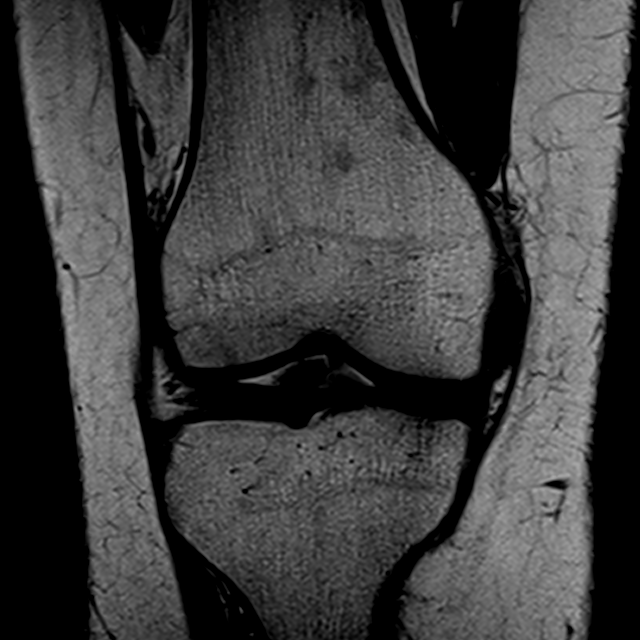
[im 21/26]
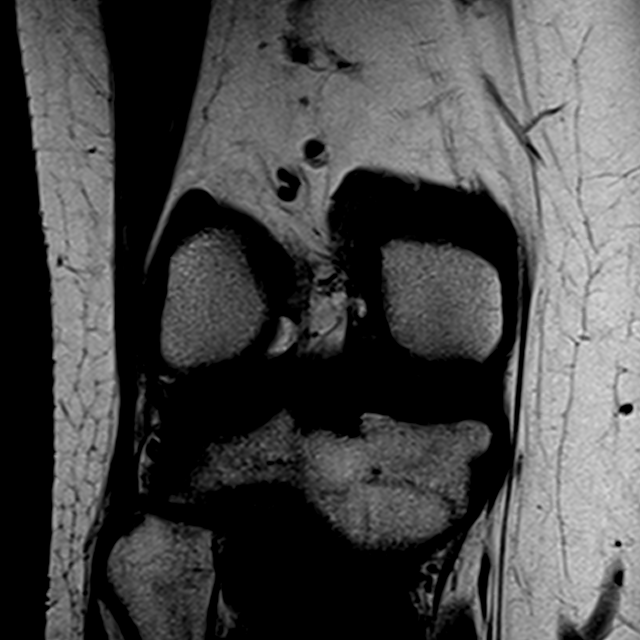

[Series 5: pdfs sag · sagittal · 3.0mm · 0.20mm/px · 3 of 29 slices shown]
[im 5/29]
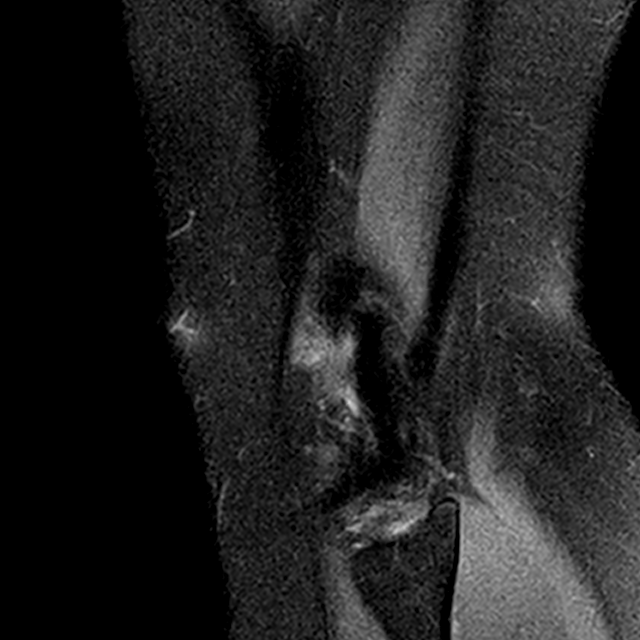
[im 17/29]
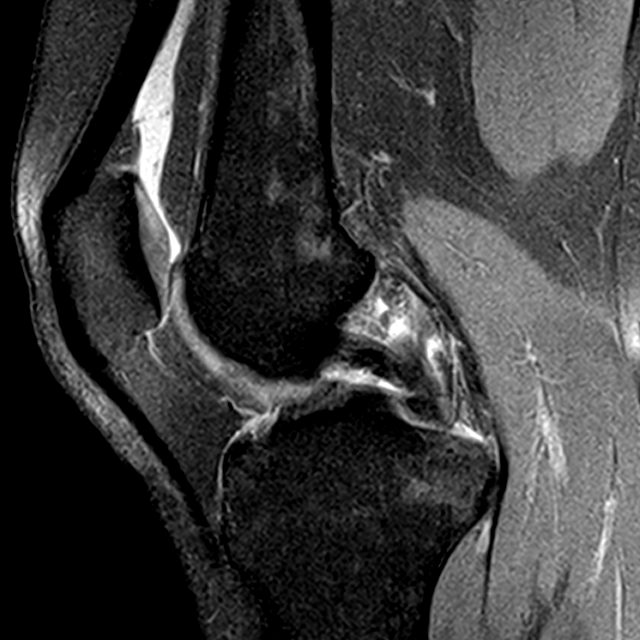
[im 25/29]
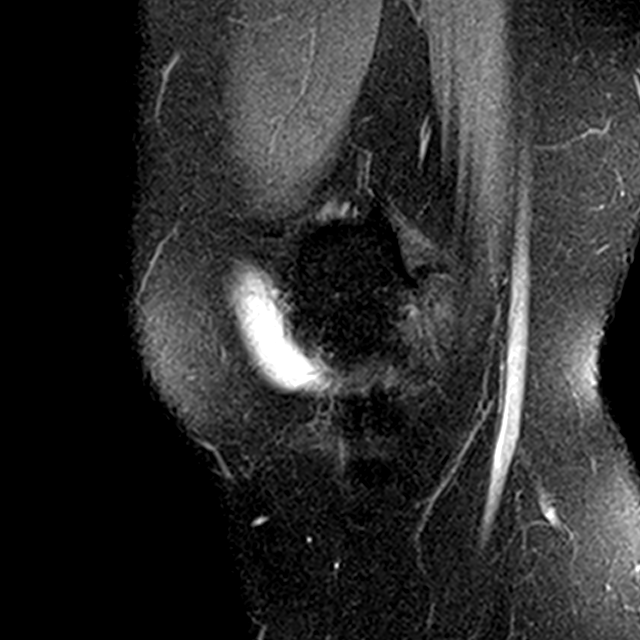

[Series 6: t2fs cor · coronal · 3.0mm · 0.21mm/px · 3 of 26 slices shown]
[im 5/26]
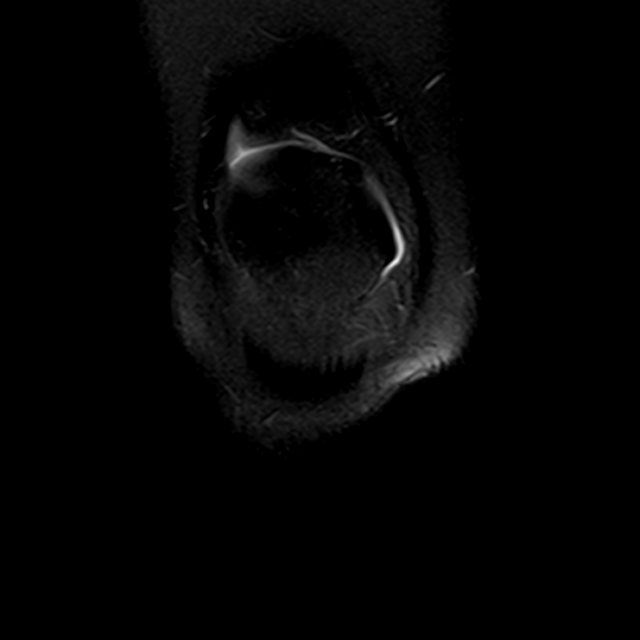
[im 13/26]
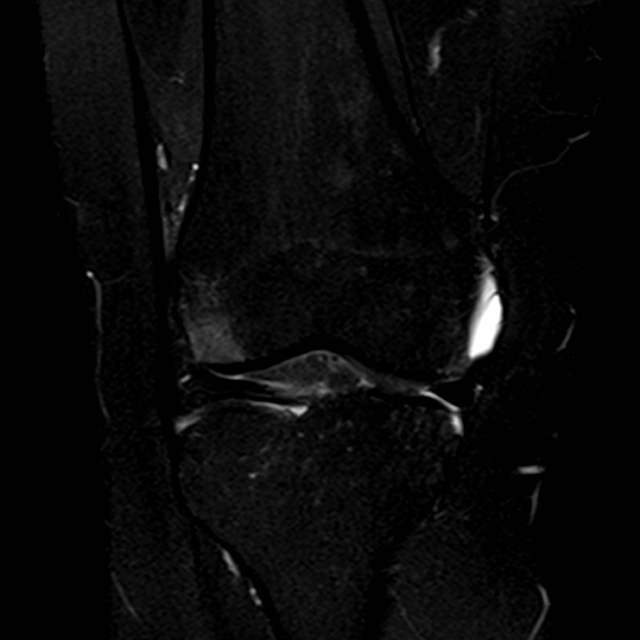
[im 21/26]
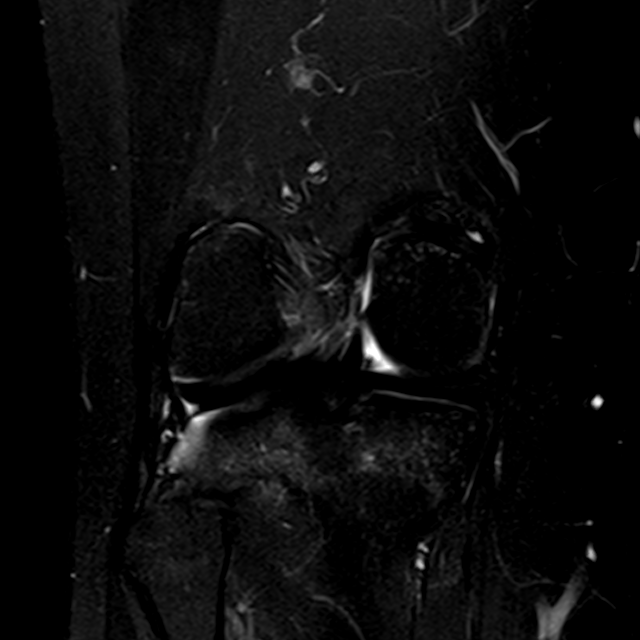

[13 of 40 positions shown; findings below may reference images not displayed]

FINDINGS: MENISCI

Medial meniscus: Intact. Mild intrasubstance increased T2 signal in
the posterior horn is noted.

Lateral meniscus:  Intact.

LIGAMENTS

Cruciates: The anterior cruciate ligament is completely torn. The
posterior cruciate ligament is intact.

Collaterals:  Intact.

CARTILAGE

Patellofemoral:  Unremarkable.

Medial:  Unremarkable.

Lateral:  Unremarkable.

Joint:  Small joint effusion is noted.

Popliteal Fossa:  Unremarkable.

Extensor Mechanism:  Intact.

Bones: Mild marrow edema is seen in the posterior aspect of the
tibia, worst laterally, and in the lateral femoral condyle
consistent with resolving bone contusions.
IMPRESSION: Findings consistent with a subacute, complete ACL tear with
resolving bone contusions identified.

Negative for meniscal or collateral ligament tear.

## 2016-07-13 ENCOUNTER — Other Ambulatory Visit: Payer: Self-pay | Admitting: Nurse Practitioner

## 2016-07-13 ENCOUNTER — Telehealth: Payer: Self-pay | Admitting: Nurse Practitioner

## 2016-07-13 MED ORDER — FLUCONAZOLE 150 MG PO TABS: ORAL_TABLET | ORAL | 0 refills | Status: DC

## 2016-07-13 NOTE — Telephone Encounter (Signed)
Pt called stating that she is having a vaginal odor and thinks she may have a yeast inf. Can something be called in.    [facility]

## 2016-07-13 NOTE — Telephone Encounter (Signed)
Will send in yeast medicine. Call back next week if persists.

## 2016-07-16 NOTE — Telephone Encounter (Signed)
Pt notified on voicemail  °

## 2016-08-16 ENCOUNTER — Encounter: Payer: Self-pay | Admitting: Family Medicine

## 2016-08-16 ENCOUNTER — Ambulatory Visit (INDEPENDENT_AMBULATORY_CARE_PROVIDER_SITE_OTHER): Payer: Federal, State, Local not specified - PPO | Admitting: Family Medicine

## 2016-08-16 VITALS — BP 106/70 | Temp 98.7°F | Ht 66.0 in | Wt 133.4 lb

## 2016-08-16 DIAGNOSIS — J329 Chronic sinusitis, unspecified: Secondary | ICD-10-CM

## 2016-08-16 DIAGNOSIS — J310 Chronic rhinitis: Secondary | ICD-10-CM

## 2016-08-16 MED ORDER — AMOXICILLIN-POT CLAVULANATE 875-125 MG PO TABS: 1.0000 | ORAL_TABLET | Freq: Two times a day (BID) | ORAL | 0 refills | Status: AC

## 2016-08-16 NOTE — Progress Notes (Signed)
   Subjective:    Patient ID: Ariel Parker, female    DOB: 1990-05-28, 26 y.o.   MRN: [id number]  Sore Throat   This is a new problem. The current episode started 1 to 4 weeks ago. Associated symptoms include coughing, ear pain and headaches.   Results for orders placed or performed in visit on 10/02/12  Epstein-Barr virus VCA antibody panel  Result Value Ref Range   EBV VCA IgG 48.9 (H) <18.0 U/mL   EBV VCA IgM 45.9 (H) <36.0 U/mL   EBV EA IgG <5.0 <9.0 U/mL   EBV NA IgG 319.0 (H) <18.0 U/mL     Still in [employer]  Two weeks  Ago started all at once, felt like a head cold, ears plugged up, runny nose coughing   Productive with phylegm  Started as a head cold  yest messy ooking throat  Sinus meds prn   Non smoker    Patient states no other concerns this visit.   Review of Systems  HENT: Positive for ear pain.   Respiratory: Positive for cough.   Neurological: Positive for headaches.       Objective:   Physical Exam  Alert, mild malaise. Hydration good Vitals stable. frontal/ maxillary tenderness evident positive nasal congestion. pharynx normal neck supple  lungs clear/no crackles or wheezes. heart regular in rhythm       Assessment & Plan:  Impression rhinosinusitis likely post viral, discussed with patient. plan antibiotics prescribed. Questions answered. Symptomatic care discussed. warning signs discussed. WSL

## 2016-10-11 ENCOUNTER — Encounter: Payer: Self-pay | Admitting: Nurse Practitioner

## 2016-10-11 ENCOUNTER — Ambulatory Visit (INDEPENDENT_AMBULATORY_CARE_PROVIDER_SITE_OTHER): Payer: Federal, State, Local not specified - PPO | Admitting: Nurse Practitioner

## 2016-10-11 VITALS — BP 108/78 | Temp 98.9°F | Ht 66.0 in | Wt 131.0 lb

## 2016-10-11 DIAGNOSIS — N760 Acute vaginitis: Secondary | ICD-10-CM | POA: Diagnosis not present

## 2016-10-11 DIAGNOSIS — Z113 Encounter for screening for infections with a predominantly sexual mode of transmission: Secondary | ICD-10-CM | POA: Diagnosis not present

## 2016-10-11 DIAGNOSIS — B9689 Other specified bacterial agents as the cause of diseases classified elsewhere: Secondary | ICD-10-CM | POA: Diagnosis not present

## 2016-10-11 LAB — POCT WET PREP WITH KOH
KOH PREP POC: NEGATIVE
RBC Wet Prep HPF POC: NEGATIVE
Trichomonas, UA: NEGATIVE
YEAST WET PREP PER HPF POC: NEGATIVE
pH, Wet Prep: 4.5

## 2016-10-11 MED ORDER — METRONIDAZOLE 500 MG PO TABS: 500.0000 mg | ORAL_TABLET | Freq: Two times a day (BID) | ORAL | 0 refills | Status: DC

## 2016-10-11 NOTE — Progress Notes (Signed)
Subjective:  Presents for complaints of vaginal odor that is been going on long term. 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 Odor seem to get worse at that time. No vaginal discharge fever or pelvic pain. Slight itching at times. No urinary symptoms. Has irregular menses. Had a normal cycle about 3 weeks ago. Same sexual partner but very intermittent intercourse, describes this as a "long distance relationship.  Objective:   BP 108/78   Temp 98.9 F (37.2 C)   Ht 5\' 6"  (1.676 m)   Wt 131 lb 0.4 oz (59.4 kg)   LMP 09/17/2016 (Approximate)   BMI 21.15 kg/m  NAD. Alert, oriented. Lungs clear. Heart regular rate rhythm. External GU no rashes or lesions. Vagina no erythema, a very small amount of slightly yellowish mucoid discharge noted. No CMT. Bimanual exam no tenderness or obvious masses. Results for orders placed or performed in visit on 10/11/16  POCT Wet Prep with KOH  Result Value Ref Range   Trichomonas, UA Negative    Clue Cells Wet Prep HPF POC rare    Epithelial Wet Prep HPF POC Few Few, Moderate, Many, Too numerous to count   Yeast Wet Prep HPF POC neg    Bacteria Wet Prep HPF POC Moderate (A) Few   RBC Wet Prep HPF POC neg    WBC Wet Prep HPF POC TNTC    KOH Prep POC Negative Negative   pH, Wet Prep 4.5      Assessment:   Bacterial vaginosis - Plan: Chlamydia/Gonococcus/Trichomonas, NAA, POCT Wet Prep with KOH  Routine screening for STI (sexually transmitted infection) - Plan: Chlamydia/Gonococcus/Trichomonas, NAA    Plan:   Meds ordered this encounter  Medications  . metroNIDAZOLE (FLAGYL) 500 MG tablet    Sig: Take 1 tablet (500 mg total) by mouth 2 (two) times daily with a meal.    Dispense:  14 tablet    Refill:  0    Order Specific Question:   Supervising Provider    Answer:   Merlyn AlbertLUKING, WILLIAM S [[id number]]   Although the pH was very close to normal, a few clue cells were noted and combined with a history of odor treated with  metronidazole to cover bacterial vaginosis. STD testing pending. Warning signs reviewed. Follow-up on 7/31 for her physical and Pap smear, call back sooner if any problems.

## 2016-10-14 ENCOUNTER — Other Ambulatory Visit: Payer: Self-pay | Admitting: Nurse Practitioner

## 2016-10-14 LAB — CHLAMYDIA/GONOCOCCUS/TRICHOMONAS, NAA
CHLAMYDIA BY NAA: POSITIVE — AB
GONOCOCCUS BY NAA: NEGATIVE
TRICH VAG BY NAA: NEGATIVE

## 2016-10-14 MED ORDER — AZITHROMYCIN 250 MG PO TABS: ORAL_TABLET | ORAL | 0 refills | Status: DC

## 2016-10-23 ENCOUNTER — Encounter: Payer: Federal, State, Local not specified - PPO | Admitting: Nurse Practitioner

## 2016-11-19 ENCOUNTER — Encounter: Payer: Federal, State, Local not specified - PPO | Admitting: Nurse Practitioner

## 2016-12-31 ENCOUNTER — Ambulatory Visit (INDEPENDENT_AMBULATORY_CARE_PROVIDER_SITE_OTHER): Payer: Federal, State, Local not specified - PPO | Admitting: Nurse Practitioner

## 2016-12-31 ENCOUNTER — Encounter: Payer: Self-pay | Admitting: Nurse Practitioner

## 2016-12-31 VITALS — BP 110/72 | Ht 66.0 in | Wt 129.6 lb

## 2016-12-31 DIAGNOSIS — Z113 Encounter for screening for infections with a predominantly sexual mode of transmission: Secondary | ICD-10-CM

## 2016-12-31 DIAGNOSIS — Z0000 Encounter for general adult medical examination without abnormal findings: Secondary | ICD-10-CM | POA: Diagnosis not present

## 2016-12-31 DIAGNOSIS — B3731 Acute candidiasis of vulva and vagina: Secondary | ICD-10-CM

## 2016-12-31 DIAGNOSIS — R87610 Atypical squamous cells of undetermined significance on cytologic smear of cervix (ASC-US): Secondary | ICD-10-CM | POA: Diagnosis not present

## 2016-12-31 DIAGNOSIS — Z124 Encounter for screening for malignant neoplasm of cervix: Secondary | ICD-10-CM

## 2016-12-31 DIAGNOSIS — B373 Candidiasis of vulva and vagina: Secondary | ICD-10-CM | POA: Diagnosis not present

## 2016-12-31 LAB — POCT WET PREP WITH KOH
KOH PREP POC: NEGATIVE
RBC Wet Prep HPF POC: NEGATIVE
TRICHOMONAS UA: NEGATIVE
YEAST WET PREP PER HPF POC: NEGATIVE
pH, Wet Prep: 4.5

## 2016-12-31 MED ORDER — TERCONAZOLE 0.8 % VA CREA: 1.0000 | TOPICAL_CREAM | Freq: Every day | VAGINAL | 0 refills | Status: DC

## 2016-12-31 NOTE — Progress Notes (Signed)
Subjective:    Patient ID: Ariel Parker, female    DOB: 10/15/1990, 26 y.o.   MRN: [id number]  HPI presents for her wellness exam. Same sexual partner. States both have completed treatment for chlamydia. Using condoms consistently. Slightly irregular cycles, very heavy to light flow. Regular exercise. Regular vision exam. Needs dental exam.  Depression screen [facility] 2/9 12/31/2016  Decreased Interest 0  Down, Depressed, Hopeless 0  PHQ - 2 Score 0          Review of Systems  Constitutional: Negative for activity change, appetite change and fatigue.  HENT: Negative for dental problem, ear pain, sinus pressure and sore throat.   Respiratory: Negative for cough, chest tightness, shortness of breath and wheezing.   Cardiovascular: Negative for chest pain.  Gastrointestinal: Negative for abdominal distention, abdominal pain, constipation, diarrhea, nausea and vomiting.  Genitourinary: Positive for menstrual problem. Negative for difficulty urinating, dysuria, enuresis, frequency, genital sores, pelvic pain, urgency and vaginal discharge.       No discharge but has noticed odor and itching.        Objective:   Physical Exam  Constitutional: She is oriented to person, place, and time. She appears well-developed. No distress.  HENT:  Right Ear: External ear normal.  Left Ear: External ear normal.  Mouth/Throat: Oropharynx is clear and moist.  Neck: Normal range of motion. Neck supple. No tracheal deviation present. No thyromegaly present.  Cardiovascular: Normal rate, regular rhythm and normal heart sounds.  Exam reveals no gallop.   No murmur heard. Pulmonary/Chest: Effort normal and breath sounds normal. Right breast exhibits no inverted nipple, no mass, no skin change and no tenderness. Left breast exhibits no inverted nipple, no mass, no skin change and no tenderness. Breasts are symmetrical.  Abdominal: Soft. She exhibits no distension. There is no tenderness.  Genitourinary:  Vagina normal and uterus normal. No vaginal discharge found.  Genitourinary Comments: External GU: no rashes or lesion. Small amount mucoid discharge with slight color. No odor. No CMT. Cervix normal in appearance. Bimanual exam: no tenderness or obvious masses.   Musculoskeletal: She exhibits no edema.  Lymphadenopathy:    She has no cervical adenopathy.  Neurological: She is alert and oriented to person, place, and time.  Skin: Skin is warm and dry. No rash noted.  Psychiatric: She has a normal mood and affect. Her behavior is normal.  Vitals reviewed. Axillae no adenopathy. Results for orders placed or performed in visit on 12/31/16  POCT Wet Prep with KOH  Result Value Ref Range   Trichomonas, UA Negative    Clue Cells Wet Prep HPF POC rare    Epithelial Wet Prep HPF POC Moderate Few, Moderate, Many, Too numerous to count   Yeast Wet Prep HPF POC neg    Bacteria Wet Prep HPF POC Few Few   RBC Wet Prep HPF POC neg    WBC Wet Prep HPF POC occas    KOH Prep POC Negative Negative   pH, Wet Prep 4.5            Assessment & Plan:  Routine general medical examination at a health care facility - Plan: Pap IG and Chlamydia/Gonococcus, NAA  Screening for cervical cancer - Plan: Pap IG and Chlamydia/Gonococcus, NAA  Screen for STD (sexually transmitted disease) - Plan: Pap IG and Chlamydia/Gonococcus, NAA  Vaginal candidiasis - Plan: POCT Wet Prep with KOH  Meds ordered this encounter  Medications  . terconazole (TERAZOL 3) 0.8 % vaginal cream  Sig: Place 1 applicator vaginally at bedtime.    Dispense:  20 g    Refill:  0    Order Specific Question:   Supervising Provider    Answer:   Merlyn Albert [[id number]]   Given information on hormonal options for birth control. Patient will research and contact office if she wishes to start something. Discussed safe sex issues. Call back if vaginal symptoms persist. Return in about 1 year (around 12/31/2017) for physical.

## 2016-12-31 NOTE — Patient Instructions (Signed)
Pill Depo Provera Nexplanon IUD: Mirena, Teresa Pelton, Christean Grief Nuvaring

## 2017-01-02 ENCOUNTER — Encounter: Payer: Self-pay | Admitting: Nurse Practitioner

## 2017-01-02 LAB — PAP IG AND CT-NG NAA
CHLAMYDIA, NUC. ACID AMP: POSITIVE — AB
Gonococcus by Nucleic Acid Amp: NEGATIVE
PAP Smear Comment: 0

## 2017-01-03 ENCOUNTER — Encounter: Payer: Self-pay | Admitting: Nurse Practitioner

## 2017-01-03 ENCOUNTER — Other Ambulatory Visit: Payer: Self-pay | Admitting: Nurse Practitioner

## 2017-01-03 DIAGNOSIS — R87610 Atypical squamous cells of undetermined significance on cytologic smear of cervix (ASC-US): Secondary | ICD-10-CM | POA: Insufficient documentation

## 2017-01-03 MED ORDER — AZITHROMYCIN 250 MG PO TABS: ORAL_TABLET | ORAL | 0 refills | Status: DC

## 2017-01-08 ENCOUNTER — Encounter: Payer: Self-pay | Admitting: Nurse Practitioner

## 2017-01-23 ENCOUNTER — Encounter: Payer: Self-pay | Admitting: Nurse Practitioner

## 2017-01-25 ENCOUNTER — Other Ambulatory Visit: Payer: Self-pay | Admitting: Nurse Practitioner

## 2017-01-25 MED ORDER — DOXYCYCLINE HYCLATE 100 MG PO TABS: 100.0000 mg | ORAL_TABLET | Freq: Two times a day (BID) | ORAL | 0 refills | Status: DC

## 2017-11-13 ENCOUNTER — Telehealth: Payer: Self-pay | Admitting: Family Medicine

## 2017-11-13 NOTE — Telephone Encounter (Signed)
Pt calling checking to see if the labs from urgent care where received 2 weeks ago. The labs where her creatin and bun ratio. All her other labs came back normal but those and the urgent care thought it would be best to get the opinion of her pcp.

## 2017-11-13 NOTE — Telephone Encounter (Signed)
Patient states she will bring by copy of lab work tomorrow for review.

## 2017-11-14 NOTE — Telephone Encounter (Signed)
When the copy is available please forward to me for my opinion thank you

## 2017-11-19 NOTE — Telephone Encounter (Signed)
Left message to return call 

## 2017-11-19 NOTE — Telephone Encounter (Signed)
Patient states she will go by the urgent care and ask them for a copy and bring them by the office for our review.

## 2017-11-22 NOTE — Telephone Encounter (Signed)
Results in red folder in office for review

## 2017-11-26 NOTE — Telephone Encounter (Signed)
Please tell the patient I looked over her papers from the urgent care center from [facility]  Her hemoglobin looks very good white blood count looks good.  Her glucose looks fine.  As for the elevated BUN/creatinine ratio often that is when a person has not kept well-hydrated.  Her overall creatinine function and is very normal I would not be too worried or concerned about this.  Certainly we are available if she would like to come in and have a further discussion

## 2017-11-26 NOTE — Telephone Encounter (Signed)
Left message to return call 

## 2017-11-27 NOTE — Telephone Encounter (Signed)
Discussed with pt. Pt verbalized understanding. She does not want appt at this time.

## 2017-12-18 ENCOUNTER — Encounter: Payer: Self-pay | Admitting: Family Medicine

## 2017-12-18 ENCOUNTER — Ambulatory Visit (INDEPENDENT_AMBULATORY_CARE_PROVIDER_SITE_OTHER): Admitting: Family Medicine

## 2017-12-18 VITALS — BP 130/88 | HR 76 | Temp 98.3°F | Ht 66.0 in | Wt 140.0 lb

## 2017-12-18 DIAGNOSIS — R002 Palpitations: Secondary | ICD-10-CM

## 2017-12-18 NOTE — Progress Notes (Signed)
Subjective:    Patient ID: Ariel Parker, female    DOB: [birth year], 27 y.o.   MRN: [id number]  HPI  Patient is here today with complaints of palpitations for the last month. Intially was happening during exercise,but now it happens randomly . She states she has some shortness of breath when she has the palpitations and is lightheaded. She went to the Urgent care in [place] on August 7,2019, they did blood work on her and was told she was dehydrated. No ekg was done at this time. Blood work was reviewed by Dr. Lorin Picket on 11/13/17, nothing concerning noted on lab work.  Reports hx of palpitations 1.5 months first started with exercise, now intermittent, happening about every other day. Reports feels like heart is racing lasts about 30 sec - 1 min, feels it in her chest and in her neck, feels light headed and short of breath when this occurs.  Denies chest pain, numbness or tingling to neck or arm, sweating, or N/V. Reports she just tries to take deep breaths and sits down.  Denies significant stress or anxiety. Drinks 1 cup of caffeinated beverage per day.  Mom reports concern for possible thyroid issues. Pt reports irregular menstrual cycles, and brittle nails, reports fatigue  Family hx: maternal side significant for multiple cardiac issues per pt's mom. maternal uncle: triple bypass at age 48; maternal great grandmother deceased from MI;   Review of Systems  Constitutional: Positive for fatigue. Negative for diaphoresis.  Respiratory: Positive for shortness of breath.   Cardiovascular: Positive for palpitations. Negative for chest pain and leg swelling.  Gastrointestinal: Negative for nausea and vomiting.  Genitourinary: Positive for menstrual problem.  Neurological: Positive for light-headedness. Negative for numbness.  Psychiatric/Behavioral: The patient is not nervous/anxious and is not hyperactive.        Objective:   Physical Exam  Constitutional: She is oriented to person,  place, and time. She appears well-developed and well-nourished. No distress.  HENT:  Head: Normocephalic and atraumatic.  Eyes: Right eye exhibits no discharge. Left eye exhibits no discharge.  Neck: Neck supple. No thyromegaly present.  Cardiovascular: Normal rate, regular rhythm, normal heart sounds and intact distal pulses.  No murmur heard. Pulmonary/Chest: Effort normal and breath sounds normal. No respiratory distress. She has no wheezes.  Lymphadenopathy:    She has no cervical adenopathy.  Neurological: She is alert and oriented to person, place, and time.  Skin: Skin is warm and dry. She is not diaphoretic.  Psychiatric: She has a normal mood and affect. Her behavior is normal. Judgment and thought content normal.  Nursing note and vitals reviewed.     Assessment & Plan:  1. Palpitations EKG today normal, reviewed by Dr. Lorin Picket. No previous EKG available for comparison. Will refer to cardiology as patient will need an event monitor to further evaluate palpitations. Will obtain labs per orders to rule out thyroid involvement and electrolyte abnormalities. Pt is in full-time reserves, form filled out and signed that she may not perform any physically strenuous activities or be deployed until this is evaluated further, she may continue with her normal daily local duties in [place], [place]. We will f/u with patient once we have results of her labs and referral information. Warning signs discussed. Educated patient to sit down when she has the palpitations and try to check her radial pulse and make note of the regularity and rate. Pt is agreeable to this plan.  - Ambulatory referral to Cardiology - TSH - T4, free -  Basic metabolic panel - Magnesium  As attending physician to this patient visit, this patient was seen in conjunction with the nurse practitioner.  The history,physical and treatment plan was reviewed with the nurse practitioner and pertinent findings were verified with the  patient.  Also the treatment plan was reviewed with the patient while they were present. SAL

## 2017-12-19 LAB — BASIC METABOLIC PANEL
BUN/Creatinine Ratio: 26 — ABNORMAL HIGH (ref 9–23)
BUN: 15 mg/dL (ref 6–20)
CO2: 24 mmol/L (ref 20–29)
Calcium: 10.1 mg/dL (ref 8.7–10.2)
Chloride: 100 mmol/L (ref 96–106)
Creatinine, Ser: 0.58 mg/dL (ref 0.57–1.00)
GFR calc non Af Amer: 129 mL/min/{1.73_m2} (ref >59)
GFR, EST AFRICAN AMERICAN: 148 mL/min/{1.73_m2} (ref >59)
Glucose: 80 mg/dL (ref 65–99)
POTASSIUM: 3.9 mmol/L (ref 3.5–5.2)
SODIUM: 142 mmol/L (ref 134–144)

## 2017-12-19 LAB — T4, FREE: Free T4: 1.38 ng/dL (ref 0.82–1.77)

## 2017-12-19 LAB — TSH: TSH: 0.608 u[IU]/mL (ref 0.450–4.500)

## 2017-12-19 LAB — MAGNESIUM: Magnesium: 2.3 mg/dL (ref 1.6–2.3)

## 2017-12-26 ENCOUNTER — Ambulatory Visit (INDEPENDENT_AMBULATORY_CARE_PROVIDER_SITE_OTHER): Payer: Federal, State, Local not specified - PPO | Admitting: Cardiology

## 2017-12-26 ENCOUNTER — Ambulatory Visit (HOSPITAL_COMMUNITY): Payer: Federal, State, Local not specified - PPO | Attending: Cardiovascular Disease

## 2017-12-26 ENCOUNTER — Encounter: Payer: Self-pay | Admitting: Cardiology

## 2017-12-26 ENCOUNTER — Other Ambulatory Visit: Payer: Self-pay | Admitting: Cardiology

## 2017-12-26 ENCOUNTER — Ambulatory Visit (INDEPENDENT_AMBULATORY_CARE_PROVIDER_SITE_OTHER): Payer: Federal, State, Local not specified - PPO

## 2017-12-26 VITALS — BP 112/70 | HR 74 | Ht 66.0 in | Wt 140.0 lb

## 2017-12-26 DIAGNOSIS — R000 Tachycardia, unspecified: Secondary | ICD-10-CM

## 2017-12-26 DIAGNOSIS — R0600 Dyspnea, unspecified: Secondary | ICD-10-CM | POA: Insufficient documentation

## 2017-12-26 DIAGNOSIS — I340 Nonrheumatic mitral (valve) insufficiency: Secondary | ICD-10-CM | POA: Diagnosis not present

## 2017-12-26 DIAGNOSIS — R002 Palpitations: Secondary | ICD-10-CM

## 2017-12-26 DIAGNOSIS — R55 Syncope and collapse: Secondary | ICD-10-CM

## 2017-12-26 DIAGNOSIS — R0602 Shortness of breath: Secondary | ICD-10-CM | POA: Diagnosis not present

## 2017-12-26 DIAGNOSIS — Z8249 Family history of ischemic heart disease and other diseases of the circulatory system: Secondary | ICD-10-CM | POA: Insufficient documentation

## 2017-12-26 LAB — ECHOCARDIOGRAM COMPLETE
Height: 66 in
Weight: 2240 oz

## 2017-12-26 NOTE — Progress Notes (Signed)
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 The pt says she had an episode of tachycardia in August just as she started a run. She became weak with near syncope. It lasted "a few seconds". Since then she has had intermittent episodes at rest which last less than a minute. This may happen twice a week. She has taken pseudoephedrine in the past for sinusitis but not since Spring. During these episodes she feels SOB.    No current outpatient medications on file.   No current facility-administered medications for this visit.     Allergies  Allergen Reactions  . Tylenol With Codeine #3 [Acetaminophen-Codeine] Other (See Comments)    Severe acid reflux    Past Medical History:  Diagnosis Date  . Disruption of anterior cruciate ligament of right knee 09/2014  . Palpitations     Social History   Socioeconomic History  . Marital status: Single    Spouse name: Not on file  . Number of children: Not on file  . Years of education: Not on file  . Highest education level: Not on file  Occupational History  . Not on file  Social Needs  . Financial resource strain: Not on file  . Food insecurity:    Worry: Not on file    Inability: Not on file  . Transportation needs:    Medical: Not on file    Non-medical: Not on file  Tobacco Use  . Smoking status: Never Smoker  . Smokeless tobacco: Never Used  Substance and Sexual Activity  . Alcohol use: Yes    Comment: occasionally  . Drug use: No  . Sexual activity: Not on file  Lifestyle  . Physical activity:    Days per week: Not on file    Minutes per session: Not on file  . Stress: Not on file  Relationships  . Social connections:    Talks on phone: Not on file    Gets together: Not on file    Attends religious  service: Not on file    Active member of club or organization: Not on file    Attends meetings of clubs or organizations: Not on file    Relationship status: Not on file  . Intimate partner violence:    Fear of current or ex partner: Not on file    Emotionally abused: Not on file    Physically abused: Not on file    Forced sexual activity: Not on file  Other Topics Concern  . Not on file  Social History Narrative  . Not on file     Family History  Problem Relation Age of Onset  . Coronary artery disease Maternal Grandmother        MI in her 66's (?)     Review of Systems: General: negative for chills, fever, night sweats or weight changes.  Cardiovascular: negative for chest pain, dyspnea on exertion, edema, orthopnea, paroxysmal nocturnal dyspnea Dermatological: negative for rash Respiratory: negative for cough or wheezing Urologic: negative for hematuria Abdominal: negative for nausea, vomiting, diarrhea, bright red blood per rectum, melena, or hematemesis Neurologic: negative for visual changes, syncope, or dizziness All other systems reviewed and are otherwise negative except as noted above.    Blood pressure 112/70, pulse 74, height 5\' 6"  (1.676 m),  weight 140 lb (63.5 kg).  General appearance: alert, cooperative and no distress Neck: no carotid bruit and no JVD Lungs: clear to auscultation bilaterally Heart: regular rate and rhythm Abdomen: soft, non tender Extremities: no edema Pulses: 2+ and symmetric Skin: Skin color, texture, turgor normal. No rashes or lesions Neurologic: Grossly normal  EKG NSR  ASSESSMENT AND PLAN:   Palpitations Pt referred for history of palpitations. Onset 2 months ago-associated with near syncope and dyspnea.   Dyspnea Associated with her palpitations   PLAN  Check 30 day event monitor, check echo, f/u with Dr Cristal Deer. Avoid OTC decongestants.   Corine Shelter PA-C 12/26/2017 11:39 AM

## 2017-12-26 NOTE — Assessment & Plan Note (Signed)
Pt referred for history of palpitations. Onset 2 months ago-associated with near syncope and dyspnea.

## 2017-12-26 NOTE — Assessment & Plan Note (Signed)
Associated with her palpitations

## 2017-12-26 NOTE — Patient Instructions (Signed)
Medication Instructions:  Your physician recommends that you continue on your current medications as directed. Please refer to the Current Medication list given to you today.  If you need a refill on your cardiac medications before your next appointment, please call your pharmacy.  Labwork: None  [employer] in our office  Testing/Procedures: Your physician has requested that you have an echocardiogram. Echocardiography is a painless test that uses sound waves to create images of your heart. It provides your doctor with information about the size and shape of your heart and how well your heart's chambers and valves are working. This procedure takes approximately one hour. There are no restrictions for this procedure.  Your physician has recommended that you wear an 30 day  event monitor. Event monitors are medical devices that record the heart's electrical activity. Doctors most often [place] these monitors to diagnose arrhythmias. Arrhythmias are problems with the speed or rhythm of the heartbeat. The monitor is a small, portable device. You can wear one while you do your normal daily activities. This is usually used to diagnose what is causing palpitations/syncope (passing out). BOTH APPOINT,ENTS WILL BE AT [address]   Follow-Up: Your physician recommends that you schedule a follow-up appointment in: 2 WEEKS AFTER EVENT MONITOR WITH DR Cristal Deer.  Any Other Special Instructions Will Be Listed Below (If Applicable).

## 2017-12-30 NOTE — Addendum Note (Signed)
Addended by: Adline Peals on: 12/30/2017 04:19 PM   Modules accepted: Orders

## 2018-01-30 ENCOUNTER — Telehealth: Payer: Self-pay | Admitting: Cardiology

## 2018-01-30 NOTE — Telephone Encounter (Signed)
Left message to call back  

## 2018-01-30 NOTE — Telephone Encounter (Signed)
Spoke with pt's mother who states she was calling for monitor results. Informed that we are awaiting for results to be uploaded to our system. [place] monitor and was unable to wear it for the full 30 days. Pt is still out of the country and rescheduled appointment for 02/14/18. Advised that we should have monitor reports by then for Dr. Cristal Deer to go over results at appointment. Verbalized understanding.

## 2018-01-30 NOTE — Telephone Encounter (Signed)
Mother is calling for results to her daughter Holter Monitor.

## 2018-01-31 ENCOUNTER — Ambulatory Visit: Payer: Federal, State, Local not specified - PPO | Admitting: Cardiology

## 2018-02-14 ENCOUNTER — Ambulatory Visit: Payer: Federal, State, Local not specified - PPO | Admitting: Cardiology

## 2018-02-14 ENCOUNTER — Ambulatory Visit (INDEPENDENT_AMBULATORY_CARE_PROVIDER_SITE_OTHER): Payer: Federal, State, Local not specified - PPO | Admitting: Cardiology

## 2018-02-14 ENCOUNTER — Encounter: Payer: Self-pay | Admitting: Cardiology

## 2018-02-14 VITALS — BP 116/74 | HR 66 | Resp 16 | Ht 66.0 in | Wt 143.0 lb

## 2018-02-14 DIAGNOSIS — Z7189 Other specified counseling: Secondary | ICD-10-CM

## 2018-02-14 DIAGNOSIS — R002 Palpitations: Secondary | ICD-10-CM

## 2018-02-14 NOTE — Patient Instructions (Signed)
Medication Instructions:  Your Physician recommend you continue on your current medication as directed.    If you need a refill on your cardiac medications before your next appointment, please call your pharmacy.   Lab work: None   Testing/Procedures: None  Follow-Up: At [facility], you and your health needs are our priority.  As part of our continuing mission to provide you with exceptional heart care, we have created designated Provider Care Teams.  These Care Teams include your primary Cardiologist (physician) and Advanced Practice Providers (APPs -  Physician Assistants and Nurse Practitioners) who all work together to provide you with the care you need, when you need it. You will need a follow up appointment as needed.  Please call our office 2 months in advance to schedule this appointment.  You may see Dr. Christopher or one of the following Advanced Practice Providers on your designated Care Team:   Rhonda Barrett, PA-C . Kathryn Lawrence, DNP, ANP  Any Other Special Instructions Will Be Listed Below (If Applicable).    

## 2018-02-14 NOTE — Progress Notes (Signed)
Cardiology Office Note:    Date:  02/14/2018   ID:  Ariel, Parker 11-06-1990, MRN [id number]  PCP:  Ariel Sciara, MD  Cardiologist:  Jodelle Red, MD PhD  Referring MD: Ariel Sciara, MD   Chief Complaint  Patient presents with  . Follow-up    History of Present Illness:    Ariel Parker is a 27 y.o. female with a hx of palpitations who is seen in follow up at the request of Parker, Ariel Coup, MD for the evaluation and management of palpitations and near syncope. She was initially seen by Ariel Parker on 12/26/17.  Per initial evaluation, she has had several events of heart racing, one associated with near syncope. Echo was unremarkable. Monitor showed sinus rhythm with sinus bradycardia and sinus tachycardia.  She presents today with her mother. Reports that episodes have been less frequent, but still about once a week. No clear inciting events, does not last long, but can be "squeezing" in nature. No syncope. Does have some shortness of breath with the palpitations.   We reviewed the results of her echo and monitor. We also reviewed that with exercise, it is ok to achieve a temporary heart rate of 220-age, and she did not exceed this threshold on her monitor. All rhythms were sinus, either bradycardia/normal/tachycardia. No arrhythmias seen on monitor, no significant abnormalities on her echo.  We reviewed her cardiovascular risk at length. Uncle had bypass surgery around age 76, has HTN, tobacco use. Mother's side has many heart attacks. She does not have lipids available and is unsure if she has ever had them checked.  Past Medical History:  Diagnosis Date  . Disruption of anterior cruciate ligament of right knee 09/2014  . Palpitations     Past Surgical History:  Procedure Laterality Date  . KNEE ARTHROSCOPY W/ ACL RECONSTRUCTION Left 07/17/2006  . KNEE ARTHROSCOPY WITH ANTERIOR CRUCIATE LIGAMENT (ACL) REPAIR WITH HAMSTRING GRAFT Right 10/14/2014   Procedure:  RIGHT KNEE ARTHROSCOPY WITH ANTERIOR CRUCIATE LIGAMENT (ACL) REPAIR;  Surgeon: Ariel Jewel, MD;  Location: [facility];  Service: Orthopedics;  Laterality: Right;  . KNEE ARTHROSCOPY WITH LATERAL MENISECTOMY Right 10/14/2014   Procedure: KNEE ARTHROSCOPY WITH LATERAL MENISECTOMY;  Surgeon: Ariel Jewel, MD;  Location: [facility];  Service: Orthopedics;  Laterality: Right;  . WISDOM TOOTH EXTRACTION      Current Medications: No current outpatient medications on file prior to visit.   No current facility-administered medications on file prior to visit.      Allergies:   Tylenol with codeine #3 [acetaminophen-codeine]   Social History   Socioeconomic History  . Marital status: Single    Spouse name: Not on file  . Number of children: Not on file  . Years of education: Not on file  . Highest education level: Not on file  Occupational History  . Not on file  Social Needs  . Financial resource strain: Not on file  . Food insecurity:    Worry: Not on file    Inability: Not on file  . Transportation needs:    Medical: Not on file    Non-medical: Not on file  Tobacco Use  . Smoking status: Never Smoker  . Smokeless tobacco: Never Used  Substance and Sexual Activity  . Alcohol use: Yes    Comment: occasionally  . Drug use: No  . Sexual activity: Not on file  Lifestyle  . Physical activity:    Days per week: Not on file  Minutes per session: Not on file  . Stress: Not on file  Relationships  . Social connections:    Talks on phone: Not on file    Gets together: Not on file    Attends religious service: Not on file    Active member of club or organization: Not on file    Attends meetings of clubs or organizations: Not on file    Relationship status: Not on file  Other Topics Concern  . Not on file  Social History Narrative  . Not on file     Family History: The patient's family history includes Coronary artery disease in her maternal  grandmother. Uncle had bypass surgery around age 161, has HTN, tobacco use. Mother's side has many heart attacks  ROS:   Please see the history of present illness.  Additional pertinent ROS:  Constitutional: Negative for chills, fever, night sweats, unintentional weight loss  HENT: Negative for ear pain and hearing loss.   Eyes: Negative for loss of vision and eye pain.  Respiratory: Positive for shortness of breath with palpitations. Negative for cough, sputum, wheezing.   Cardiovascular: Positive for palpitations. Negative for PND, orthopnea, lower extremity edema and claudication.  Gastrointestinal: Negative for abdominal pain, melena, and hematochezia.  Genitourinary: Negative for dysuria and hematuria.  Musculoskeletal: Negative for falls and myalgias.  Skin: Negative for itching and rash.  Neurological: Negative for focal weakness, focal sensory changes and loss of consciousness.  Endo/Heme/Allergies: Does not bruise/bleed easily.    EKGs/Labs/Other Studies Reviewed:    The following studies were reviewed today: Echo 12/26/17 - Left ventricle: The cavity size was normal. Systolic function was normal. The estimated ejection fraction was in the range of 60%   to 65%. Wall motion was normal; there were no regional wall motion abnormalities. Left ventricular diastolic function parameters were normal. - Aortic valve: Transvalvular velocity was within the normal range. There was no stenosis. There was no regurgitation. - Mitral valve: Transvalvular velocity was within the normal range.   There was no evidence for stenosis. There was mild regurgitation. - Right ventricle: The cavity size was normal. Wall thickness was normal. Systolic function was normal. - Tricuspid valve: There was trivial regurgitation. - Pulmonary arteries: Systolic pressure was within the normal range. PA peak pressure: 17 mm Hg (S).  Monitor 12/26/17 9 days of recorded data available. Baseline rhythm was sinus  rhythm/sinus arrhythmia with average heart rate of 90 bpm. No serious events noted. Maximum heart rate was sinus tachycardia at 178 bpm. Lowest heart rate was sinus bradycardia at 54 bpm. No atrial fibrillation, SVT, NSVT, pause, high degree AV block noted.   EKG:  EKG is personally reviewed.  The ekg ordered previously demonstrates normal sinus rhythm with sinus arrhythmia  Recent Labs: 12/18/2017: BUN 15; Creatinine, Ser 0.58; Magnesium 2.3; Potassium 3.9; Sodium 142; TSH 0.608  Recent Lipid Panel No results found for: CHOL, TRIG, HDL, CHOLHDL, VLDL, LDLCALC, LDLDIRECT  Physical Exam:    VS:  BP 116/74   Pulse 66   Resp 16   Ht 5\' 6"  (1.676 m)   Wt 143 lb (64.9 kg)   SpO2 100%   BMI 23.08 kg/m     Wt Readings from Last 3 Encounters:  02/14/18 143 lb (64.9 kg)  12/26/17 140 lb (63.5 kg)  12/18/17 140 lb 0.2 oz (63.5 kg)     GEN: Well nourished, well developed in no acute distress HEENT: Normal NECK: No JVD; No carotid bruits LYMPHATICS: No lymphadenopathy CARDIAC: regular  rhythm, normal S1 and S2, no murmurs, rubs, gallops. Radial and DP pulses 2+ bilaterally. RESPIRATORY:  Clear to auscultation without rales, wheezing or rhonchi  ABDOMEN: Soft, non-tender, non-distended MUSCULOSKELETAL:  No edema; No deformity  SKIN: Warm and dry NEUROLOGIC:  Alert and oriented x 3 PSYCHIATRIC:  Normal affect   ASSESSMENT:    1. Palpitations   2. Counseling on health promotion and disease prevention    PLAN:    1. Palpitations: less frequent. Monitor and echo unrevealing. No major personal cardiac risk factors. Counseled that from a cardiac perspective, she is fine to exercise and be active. Ok for heart rate to get to 195 with peak activity given her age, though it should not sustain there. She did not reach this on her monitor. No further workup indicated at this time.  2. Health promotion and prevention counseling: -recommend heart healthy/Mediterranean diet, with whole grains,  fruits, vegetable, fish, lean meats, nuts, and olive oil. Limit salt. -recommend moderate walking, 3-5 times/week for 30-50 minutes each session. Aim for at least 150 minutes.week. Goal should be pace of 3 miles/hours, or walking 1.5 miles in 30 minutes. She far exceeds this. -recommend avoidance of tobacco products. Avoid excess alcohol. -Additional risk factor control:  -Diabetes: A1c is not done, no history  -Lipids: cannot find any in the system. Would recommend having her lipids screened at her next PCP visit. If LDL >160, would have her come back to see me to discuss aggressive prevention  -Blood pressure control: no history of hypertension, at goal  -Weight: BMI 23, at goal -ASCVD risk score: no lipids to calculate  Plan for follow up: as needed  TIME SPENT WITH PATIENT: >30 minutes of direct patient care. More than 50% of that time was spent on coordination of care and counseling regarding palpitation workup and discussion as to how family history affects prevention recommendations.  Medication Adjustments/Labs and Tests Ordered: Current medicines are reviewed at length with the patient today.  Concerns regarding medicines are outlined above.  No orders of the defined types were placed in this encounter.  No orders of the defined types were placed in this encounter.   Patient Instructions  Medication Instructions:  Your Physician recommend you continue on your current medication as directed.    If you need a refill on your cardiac medications before your next appointment, please call your pharmacy.   Lab work: None  Testing/Procedures: None  Follow-Up: At [facility], you and your health needs are our priority.  As part of our continuing mission to provide you with exceptional heart care, we have created designated Provider Care Teams.  These Care Teams include your primary Cardiologist (physician) and Advanced Practice Providers (APPs -  Physician Assistants and Nurse  Practitioners) who all work together to provide you with the care you need, when you need it. You will need a follow up appointment as needed.  Please call our office 2 months in advance to schedule this appointment.  You may see Dr. Cristal Deer or one of the following Advanced Practice Providers on your designated Care Team:   Theodore Demark, PA-C . Joni Reining, DNP, ANP  Any Other Special Instructions Will Be Listed Below (If Applicable).       Signed, Jodelle Red, MD PhD 02/14/2018 1:14 PM    [facility]

## 2018-02-28 ENCOUNTER — Telehealth: Payer: Self-pay | Admitting: Family Medicine

## 2018-02-28 NOTE — Telephone Encounter (Signed)
Patient faxed over military form to be filled out because they lost her form that was done in sept at her visit.Please redo form so it can be faxed over.Patient seen Mardella LaymanLindsey but she stated you filled out her form but no copy was made and not in system.She needs this as soon as possible.

## 2018-02-28 NOTE — Telephone Encounter (Signed)
I called and left a message.form in the nurses box.

## 2018-02-28 NOTE — Telephone Encounter (Signed)
Form in your box

## 2018-02-28 NOTE — Telephone Encounter (Signed)
Patient states that even though she was cleared for full activity by cardiology she is still having the same original symptoms and has requested an appointment with Dr Lorin PicketScott to have for filled out for restrictions based on her ongoing symptoms. Patient scheduled appt with Dr Lorin PicketScott to discuss.

## 2018-02-28 NOTE — Telephone Encounter (Signed)
So when I wrote the restrictions way back then It was to restrict her activity until she saw cardiology to make sure that it was okay for her to do physical activity So typically we fill out these forms to restrict activity until they are seen by the specialist  This patient did in fact see cardiology Cardiology stated that they felt it was okay for her to resume physical activity  So at this point I am not quite sure what she is asking us to do for her regarding this form  Certainly I am happy to try to help but at the same time I am not sure what to put onto this form given what cardiology said to her regarding that it was okay to resume physical activity  Possibly the patient has a different insight or questions regarding this  Or possibly they just need the form on file for the time where she was restricted  Not really sure  Please help with additional info

## 2018-03-04 ENCOUNTER — Ambulatory Visit (INDEPENDENT_AMBULATORY_CARE_PROVIDER_SITE_OTHER): Admitting: Family Medicine

## 2018-03-04 ENCOUNTER — Encounter: Payer: Self-pay | Admitting: Family Medicine

## 2018-03-04 VITALS — BP 116/80 | Ht 66.0 in | Wt 141.1 lb

## 2018-03-04 DIAGNOSIS — R000 Tachycardia, unspecified: Secondary | ICD-10-CM

## 2018-03-04 DIAGNOSIS — R002 Palpitations: Secondary | ICD-10-CM | POA: Diagnosis not present

## 2018-03-04 DIAGNOSIS — R5383 Other fatigue: Secondary | ICD-10-CM

## 2018-03-04 NOTE — Progress Notes (Signed)
   Subjective:    Patient ID: Ariel Parker, female    DOB: [birth year], 27 y.o.   MRN: [id number]  HPI  Patient is here today to get medical clearance for [employer]..She states she is still having some palpitations that feel like heart is racing faster and they last longer. She has seen Cardiologist with [facility]. They have asked that she have her cholesterol checked today. 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  HENT: Negative for congestion and rhinorrhea.   Respiratory: Negative for cough, chest tightness and shortness of breath.   Cardiovascular: Positive for palpitations. Negative for chest pain and leg swelling.  Gastrointestinal: Negative for abdominal pain and nausea.  Skin: Negative for color change.  Neurological: Negative for dizziness and headaches.  Psychiatric/Behavioral: Negative for agitation and behavioral problems.       Objective:   Physical Exam  Constitutional: She appears well-nourished. No distress.  HENT:  Head: Normocephalic and atraumatic.  Eyes: Right eye exhibits no discharge. Left eye exhibits no discharge.  Neck: No tracheal deviation present.  Cardiovascular: Normal rate, regular rhythm and normal heart sounds.  No murmur heard. Pulmonary/Chest: Effort normal and breath sounds normal. No respiratory distress.  Musculoskeletal: She exhibits no edema.  Lymphadenopathy:    She has no cervical adenopathy.  Neurological: She is alert. Coordination normal.  Skin: Skin is  warm and dry.  Psychiatric: She has a normal mood and affect. Her behavior is normal.  Vitals reviewed.     25 minutes was spent with the patient.  This statement verifies that 25 minutes was indeed spent with the patient.  More than 50% of this visit-total duration of the visit-was spent in counseling and coordination of care. The issues that the patient came in for today as reflected in the diagnosis (s) please refer to documentation for further details.     Assessment & Plan:  Tachycardia Will need referral to cardiology electrophysiology specialist for possible intervention and further testing I did discuss with her several questions she had She was concerned if she needs to see endocrinology I do not feel endocrinology would have much to add because recent thyroid testing looks good We did offer this to her but I did not feel it was absolutely necessary she defers currently and will do lab work We will check CBC with up-to-date thyroid functions She does agree she needs further opinion from cardiology and she would like to seek this closer to [place] in [place] we will work with her to help set this up [facility] Lab work indicated await the results of this  25 minutes was spent with the patient.  This statement verifies that 25 minutes was indeed spent with the patient.  More than 50% of this visit-total duration of the visit-was spent in counseling and coordination of care. The issues that the patient came in for today as reflected in the diagnosis (s) please refer to documentation for further details.

## 2018-03-05 LAB — LIPID PANEL
CHOL/HDL RATIO: 2.3 ratio (ref 0.0–4.4)
CHOLESTEROL TOTAL: 156 mg/dL (ref 100–199)
HDL: 68 mg/dL (ref >39)
LDL Calculated: 77 mg/dL (ref 0–99)
Triglycerides: 56 mg/dL (ref 0–149)
VLDL CHOLESTEROL CAL: 11 mg/dL (ref 5–40)

## 2018-03-05 LAB — CBC WITH DIFFERENTIAL/PLATELET
BASOS ABS: 0 10*3/uL (ref 0.0–0.2)
BASOS: 1 %
EOS (ABSOLUTE): 0.1 10*3/uL (ref 0.0–0.4)
Eos: 1 %
HEMATOCRIT: 41.1 % (ref 34.0–46.6)
HEMOGLOBIN: 14.1 g/dL (ref 11.1–15.9)
Immature Grans (Abs): 0 10*3/uL (ref 0.0–0.1)
Immature Granulocytes: 0 %
LYMPHS ABS: 2.3 10*3/uL (ref 0.7–3.1)
LYMPHS: 31 %
MCH: 31.8 pg (ref 26.6–33.0)
MCHC: 34.3 g/dL (ref 31.5–35.7)
MCV: 93 fL (ref 79–97)
MONOS ABS: 0.5 10*3/uL (ref 0.1–0.9)
Monocytes: 6 %
NEUTROS PCT: 61 %
Neutrophils Absolute: 4.7 10*3/uL (ref 1.4–7.0)
PLATELETS: 255 10*3/uL (ref 150–450)
RBC: 4.44 x10E6/uL (ref 3.77–5.28)
RDW: 11.5 % — ABNORMAL LOW (ref 12.3–15.4)
WBC: 7.6 10*3/uL (ref 3.4–10.8)

## 2018-03-05 LAB — TSH: TSH: 0.885 u[IU]/mL (ref 0.450–4.500)

## 2018-03-05 LAB — T4, FREE: Free T4: 1.27 ng/dL (ref 0.82–1.77)

## 2018-03-05 LAB — T3 UPTAKE: T3 UPTAKE RATIO: 24 % (ref 24–39)

## 2018-03-10 ENCOUNTER — Telehealth: Payer: Self-pay | Admitting: Family Medicine

## 2018-03-10 NOTE — Telephone Encounter (Signed)
Patient was requesting [facility] in [place]  Patient will need cardiology electrophysiologist consultation  Also find out from patient that she may papers filled out from department of [employer] The last time the patient was here she thinks so  The referral is  in the system

## 2018-03-11 NOTE — Telephone Encounter (Signed)
Thank you :)

## 2018-03-11 NOTE — Telephone Encounter (Signed)
Called pt 03/10/18 & gave cardio appt info - pt verbalized understanding   Pt will see Dr. Renard MatterBock 04/02/18 @ 1:00pm  Spoke with pt about this form and she's confident that this was already completed

## 2018-04-02 DIAGNOSIS — R000 Tachycardia, unspecified: Secondary | ICD-10-CM | POA: Diagnosis not present

## 2018-05-08 DIAGNOSIS — R000 Tachycardia, unspecified: Secondary | ICD-10-CM | POA: Diagnosis not present

## 2018-05-15 DIAGNOSIS — I471 Supraventricular tachycardia: Secondary | ICD-10-CM | POA: Diagnosis not present

## 2018-06-02 ENCOUNTER — Telehealth: Payer: Self-pay | Admitting: Family Medicine

## 2018-06-02 DIAGNOSIS — R5383 Other fatigue: Secondary | ICD-10-CM

## 2018-06-02 NOTE — Telephone Encounter (Signed)
Pt called to say she's now interested in referral to endocrinology  States no new problems, previous symptoms worsening  Prefers [facility] area (someone Dr. Lorin Picket recommends)  Please advise

## 2018-06-02 NOTE — Telephone Encounter (Signed)
Please advise 

## 2018-06-03 NOTE — Telephone Encounter (Signed)
Go ahead with referral for fatigue issues with endocrinology in [place]-choose 1 of the bigger groups would be fine

## 2018-06-03 NOTE — Telephone Encounter (Signed)
Patient is aware of all and the referral placed in epic.

## 2018-06-10 ENCOUNTER — Encounter: Payer: Self-pay | Admitting: Family Medicine

## 2018-06-10 ENCOUNTER — Telehealth: Payer: Self-pay | Admitting: Family Medicine

## 2018-06-10 NOTE — Telephone Encounter (Signed)
[facility] - need to refer pt to endocrinology   [facility] - Endocrinology [phone] [address] [place], [place] [address]

## 2018-06-25 ENCOUNTER — Encounter: Payer: Self-pay | Admitting: Family Medicine

## 2018-11-09 ENCOUNTER — Telehealth: Payer: Federal, State, Local not specified - PPO | Admitting: Physician Assistant

## 2018-11-09 DIAGNOSIS — Z20828 Contact with and (suspected) exposure to other viral communicable diseases: Secondary | ICD-10-CM

## 2018-11-09 DIAGNOSIS — Z20822 Contact with and (suspected) exposure to covid-19: Secondary | ICD-10-CM

## 2018-11-09 DIAGNOSIS — R1110 Vomiting, unspecified: Secondary | ICD-10-CM

## 2018-11-09 MED ORDER — ONDANSETRON 4 MG PO TBDP: 4.0000 mg | ORAL_TABLET | Freq: Three times a day (TID) | ORAL | 0 refills | Status: DC | PRN

## 2018-11-09 NOTE — Progress Notes (Signed)
E-Visit for Corona Virus Screening   Your current symptoms could be consistent with the coronavirus. You could also have another virus that is irritating your GI tract.  Vomiting is the forceful emptying of a portion of the stomach's content through the mouth.  Although nausea and vomiting can make you feel miserable, it's important to remember that these are not diseases, but rather symptoms of an underlying illness.  When we treat short term symptoms, we always caution that any symptoms that persist should be fully evaluated in a medical office.  I have prescribed a medication that will help alleviate your symptoms and allow you to stay hydrated:  Zofran 4 mg 1 tablet every 8 hours as needed for nausea and vomiting  HOME CARE:  Drink clear liquids.  This is very important! Dehydration (the lack of fluid) can lead to a serious complication.  Start off with 1 tablespoon every 5 minutes for 8 hours.  You may begin eating bland foods after 8 hours without vomiting.  Start with saltine crackers, white bread, rice, mashed potatoes, applesauce.  After 48 hours on a bland diet, you may resume a normal diet.  Try to go to sleep.  Sleep often empties the stomach and relieves the need to vomit.  GET HELP RIGHT AWAY IF:   Your symptoms do not improve or worsen within 2 days after treatment.  You have a fever for over 3 days.  You cannot keep down fluids after trying the medication.   Due to your coronavirus exposure, you may want to get tested.  Many health care providers can now test patients at their office but not all are.  [place] has multiple testing sites. For information on our COVID testing locations and hours go to [email or web address]  Please quarantine yourself while awaiting your test results.  We are enrolling you in our [place] for [place] . Daily you will receive a questionnaire within the [place] website. Our COVID 19 response team  willl be monitoriing your responses daily.    COVID-19 is a respiratory illness with symptoms that are similar to the flu. Symptoms are typically mild to moderate, but there have been cases of severe illness and death due to the virus. The following symptoms may appear 2-14 days after exposure: . Fever . Cough . Shortness of breath or difficulty breathing . Chills . Repeated shaking with chills . Muscle pain . Headache . Sore throat . New loss of taste or smell . Fatigue . Congestion or runny nose . Nausea or vomiting . Diarrhea  It is vitally important that if you feel that you have an infection such as this virus or any other virus that you stay home and away from places where you may spread it to others.  You should self-quarantine for 14 days if you have symptoms that could potentially be coronavirus or have been in close contact a with a person diagnosed with COVID-19 within the last 2 weeks. You should avoid contact with people age 36 and older.   You should wear a mask or cloth face covering over your nose and mouth if you must be around other people or animals, including pets (even at home). Try to stay at least 6 feet away from other people. This will protect the people around you.  Reduce your risk of any infection by using the same precautions used for avoiding the common cold or flu:  Marland Kitchen Wash your hands often with soap and warm water for at  least 20 seconds.  If soap and water are not readily available, use an alcohol-based hand sanitizer with at least 60% alcohol.  . If coughing or sneezing, cover your mouth and nose by coughing or sneezing into the elbow areas of your shirt or coat, into a tissue or into your sleeve (not your hands). . Avoid shaking hands with others and consider head nods or verbal greetings only. . Avoid touching your eyes, nose, or mouth with unwashed hands.  . Avoid close contact with people who are sick. . Avoid places or events with large numbers of  people in one location, like concerts or sporting events. . Carefully consider travel plans you have or are making. . If you are planning any travel outside or inside the [place], visit the CDC's Travelers' Health webpage for the latest health notices. . If you have some symptoms but not all symptoms, continue to monitor at home and seek medical attention if your symptoms worsen. . If you are having a medical emergency, call 911.  HOME CARE . Only take medications as instructed by your medical team. . Drink plenty of fluids and get plenty of rest. . A steam or ultrasonic humidifier can help if you have congestion.   GET HELP RIGHT AWAY IF YOU HAVE EMERGENCY WARNING SIGNS** FOR COVID-19. If you or someone is showing any of these signs seek emergency medical care immediately. Call 911 or proceed to your closest emergency facility if: . You develop worsening high fever. . Trouble breathing . Bluish lips or face . Persistent pain or pressure in the chest . New confusion . Inability to wake or stay awake . You cough up blood. . Your symptoms become more severe  **This list is not all possible symptoms. Contact your medical provider for any symptoms that are sever or concerning to you.   MAKE SURE YOU   Understand these instructions.  Will watch your condition.  Will get help right away if you are not doing well or get worse.  Your e-visit answers were reviewed by a board certified advanced clinical practitioner to complete your personal care plan.  Depending on the condition, your plan could have included both over the counter or prescription medications.  If there is a problem please reply once you have received a response from your provider.  Your safety is important to us.  If you have drug allergies check your prescription carefully.    You can use MyChart to ask questions about today's visit, request a non-urgent call back, or ask for a work or school excuse for 24 hours related to  this e-Visit. If it has been greater than 24 hours you will need to follow up with your provider, or enter a new e-Visit to address those concerns. You will get an e-mail in the next two days asking about your experience.  I hope that your e-visit has been valuable and will speed your recovery. Thank you for using e-visits.  I have spent 5 minutes in review of e-visit questionnaire, review and updating patient chart, medical decision making and response to patient.    Benjiman CoreBrittany Baer Hinton, PA-C

## 2019-02-02 ENCOUNTER — Other Ambulatory Visit: Payer: Self-pay

## 2019-02-02 ENCOUNTER — Ambulatory Visit (INDEPENDENT_AMBULATORY_CARE_PROVIDER_SITE_OTHER): Admitting: Family Medicine

## 2019-02-02 DIAGNOSIS — J039 Acute tonsillitis, unspecified: Secondary | ICD-10-CM | POA: Diagnosis not present

## 2019-02-02 DIAGNOSIS — Z20828 Contact with and (suspected) exposure to other viral communicable diseases: Secondary | ICD-10-CM | POA: Diagnosis not present

## 2019-02-02 DIAGNOSIS — Z20822 Contact with and (suspected) exposure to covid-19: Secondary | ICD-10-CM

## 2019-02-02 MED ORDER — AZITHROMYCIN 250 MG PO TABS: ORAL_TABLET | ORAL | 0 refills | Status: DC

## 2019-02-02 NOTE — Progress Notes (Signed)
   Subjective:  Audio plus video  Patient ID: Ariel Parker, female    DOB: 09-Nov-1990, 28 y.o.   MRN: [id number]  Sore Throat  This is a new problem. The current episode started in the past 7 days. Associated symptoms include ear pain. Associated symptoms comments: White spots on throat.      Review of Systems  HENT: Positive for ear pain.    Virtual Visit via Video Note  I connected with Ariel Parker on 02/02/19 at  4:10 PM EST by a video enabled telemedicine application and verified that I am speaking with the correct person using two identifiers.  Location: Patient: home  Provider: office   I discussed the limitations of evaluation and management by telemedicine and the availability of in person appointments. The patient expressed understanding and agreed to proceed.  History of Present Illness:    Observations/Objective:   Assessment and Plan:   Follow Up Instructions:    I discussed the assessment and treatment plan with the patient. The patient was provided an opportunity to ask questions and all were answered. The patient agreed with the plan and demonstrated an understanding of the instructions.   The patient was advised to call back or seek an in-person evaluation if the symptoms worsen or if the condition fails to improve as anticipated.  I provide 18 minutes of non-face-to-face time during this encounter.  Patient notes congestion in both ears.  Sore throat.  A bit of a tickle cough at times.  No shortness of breath no chest pain no fever.  Just does not feel 100%.  Notes throat is inflamed and she sees white patches when she looks in    Objective:   Physical Exam  Virtual      Assessment & Plan:  Impressive exudative tonsillitis per patient plus some other mild respiratory features.  Discussion held.  Will cover with Z-Pak.  COVID-19 testing recommended.  Rationale discussed

## 2019-02-03 ENCOUNTER — Other Ambulatory Visit: Payer: Self-pay

## 2019-02-03 DIAGNOSIS — Z20822 Contact with and (suspected) exposure to covid-19: Secondary | ICD-10-CM

## 2019-02-05 LAB — NOVEL CORONAVIRUS, NAA: SARS-CoV-2, NAA: NOT DETECTED

## 2019-05-08 ENCOUNTER — Ambulatory Visit: Attending: Internal Medicine

## 2019-05-08 ENCOUNTER — Other Ambulatory Visit: Payer: Self-pay

## 2019-05-08 DIAGNOSIS — Z20822 Contact with and (suspected) exposure to covid-19: Secondary | ICD-10-CM | POA: Insufficient documentation

## 2019-05-09 LAB — NOVEL CORONAVIRUS, NAA: SARS-CoV-2, NAA: NOT DETECTED

## 2019-08-10 ENCOUNTER — Encounter: Payer: Self-pay | Admitting: Adult Health

## 2019-08-11 ENCOUNTER — Other Ambulatory Visit: Payer: Self-pay

## 2019-08-11 ENCOUNTER — Telehealth: Payer: Self-pay | Admitting: Adult Health

## 2019-08-11 ENCOUNTER — Ambulatory Visit (INDEPENDENT_AMBULATORY_CARE_PROVIDER_SITE_OTHER): Admitting: Adult Health

## 2019-08-11 ENCOUNTER — Encounter: Payer: Self-pay | Admitting: Adult Health

## 2019-08-11 VITALS — BP 113/81 | HR 73 | Ht 66.0 in | Wt 123.0 lb

## 2019-08-11 DIAGNOSIS — R4586 Emotional lability: Secondary | ICD-10-CM | POA: Diagnosis not present

## 2019-08-11 DIAGNOSIS — R002 Palpitations: Secondary | ICD-10-CM | POA: Diagnosis not present

## 2019-08-11 DIAGNOSIS — R5383 Other fatigue: Secondary | ICD-10-CM

## 2019-08-11 DIAGNOSIS — R634 Abnormal weight loss: Secondary | ICD-10-CM | POA: Diagnosis not present

## 2019-08-11 DIAGNOSIS — L680 Hirsutism: Secondary | ICD-10-CM

## 2019-08-11 NOTE — Progress Notes (Signed)
  Subjective:     Patient ID: Ariel Parker, female   DOB: Aug 22, 1990, 29 y.o.   MRN: [id number]  HPI Ariel Parker is a 29 year old white female, single, G0P0, in [employer], in complaining of mood swings and weight loss.  PCP is Lilyan Punt MD   Review of Systems +weight loss of about 20 lbs in last 3-4 months +palpitations, saw cardiologist last year and was on meds awhile +fatigue  +mood swings esp before periods for last few months Can be happy then teary  Periods regular Sleeping OK Has sex  Has had increased hair on face, arms and legs      Objective:   Physical Exam BP 113/81 (BP Location: Left Arm, Patient Position: Sitting, Cuff Size: Normal)   Pulse 73   Ht 5\' 6"  (1.676 m)   Wt 123 lb (55.8 kg)   LMP 08/02/2019   BMI 19.85 kg/m  Skin warm and dry. Neck: mid line trachea, normal thyroid, good ROM, no lymphadenopathy noted. Lungs: clear to ausculation bilaterally. Cardiovascular: regular rate and rhythm.AA 2 Fall risk is low PHQ 9 score is 6, no SI, declines meds     Assessment:     1. Mood swings Discussed trying SSRI or even low dose OCs, she declines both for now  2. Fatigue, unspecified type Check CBC, CMP, TSH, free T4  3. Weight loss Check TSH and free T4  4. Palpitations Check TSH and free T4  5. Hirsutism Check testosterone and free T    Plan:    Follow up 08/28/19

## 2019-08-11 NOTE — Telephone Encounter (Signed)
Pt states that she has a history of candida and is wanting to know if this can be added to her lab work from today to be tested.

## 2019-08-11 NOTE — Telephone Encounter (Signed)
Left message that could not add on test for candida

## 2019-08-16 LAB — CBC
Hematocrit: 41.4 % (ref 34.0–46.6)
Hemoglobin: 13.9 g/dL (ref 11.1–15.9)
MCH: 31.4 pg (ref 26.6–33.0)
MCHC: 33.6 g/dL (ref 31.5–35.7)
MCV: 94 fL (ref 79–97)
Platelets: 207 10*3/uL (ref 150–450)
RBC: 4.43 x10E6/uL (ref 3.77–5.28)
RDW: 11.3 % — ABNORMAL LOW (ref 11.7–15.4)
WBC: 5.5 10*3/uL (ref 3.4–10.8)

## 2019-08-16 LAB — COMPREHENSIVE METABOLIC PANEL
ALT: 18 IU/L (ref 0–32)
AST: 24 IU/L (ref 0–40)
Albumin/Globulin Ratio: 2 (ref 1.2–2.2)
Albumin: 5 g/dL (ref 3.9–5.0)
Alkaline Phosphatase: 48 IU/L (ref 48–121)
BUN/Creatinine Ratio: 21 (ref 9–23)
BUN: 14 mg/dL (ref 6–20)
Bilirubin Total: 0.6 mg/dL (ref 0.0–1.2)
CO2: 23 mmol/L (ref 20–29)
Calcium: 9.9 mg/dL (ref 8.7–10.2)
Chloride: 100 mmol/L (ref 96–106)
Creatinine, Ser: 0.68 mg/dL (ref 0.57–1.00)
GFR calc Af Amer: 139 mL/min/{1.73_m2} (ref >59)
GFR calc non Af Amer: 120 mL/min/{1.73_m2} (ref >59)
Globulin, Total: 2.5 g/dL (ref 1.5–4.5)
Glucose: 86 mg/dL (ref 65–99)
Potassium: 4.3 mmol/L (ref 3.5–5.2)
Sodium: 138 mmol/L (ref 134–144)
Total Protein: 7.5 g/dL (ref 6.0–8.5)

## 2019-08-16 LAB — TESTOSTERONE: Testosterone: 31 ng/dL (ref 13–71)

## 2019-08-16 LAB — TSH: TSH: 0.652 u[IU]/mL (ref 0.450–4.500)

## 2019-08-16 LAB — T4, FREE: Free T4: 1.35 ng/dL (ref 0.82–1.77)

## 2019-08-16 LAB — TESTOSTERONE, FREE: Testosterone, Free: 2.8 pg/mL (ref 0.0–4.2)

## 2019-08-28 ENCOUNTER — Ambulatory Visit: Admitting: Adult Health

## 2019-09-01 ENCOUNTER — Ambulatory Visit (INDEPENDENT_AMBULATORY_CARE_PROVIDER_SITE_OTHER): Admitting: Adult Health

## 2019-09-01 ENCOUNTER — Encounter: Payer: Self-pay | Admitting: Adult Health

## 2019-09-01 VITALS — BP 106/80 | HR 89 | Ht 66.0 in | Wt 126.0 lb

## 2019-09-01 DIAGNOSIS — B369 Superficial mycosis, unspecified: Secondary | ICD-10-CM

## 2019-09-01 DIAGNOSIS — R4586 Emotional lability: Secondary | ICD-10-CM | POA: Diagnosis not present

## 2019-09-01 MED ORDER — LO LOESTRIN FE 1 MG-10 MCG / 10 MCG PO TABS: 1.0000 | ORAL_TABLET | Freq: Every day | ORAL | 11 refills | Status: AC

## 2019-09-01 MED ORDER — NYSTATIN-TRIAMCINOLONE 100000-0.1 UNIT/GM-% EX CREA: 1.0000 "application " | TOPICAL_CREAM | Freq: Two times a day (BID) | CUTANEOUS | 1 refills | Status: AC

## 2019-09-01 NOTE — Progress Notes (Signed)
  Subjective:     Patient ID: Ariel Parker, female   DOB: [birth year], 29 y.o.   MRN: [id number]  HPI Ariel Parker is a 29 year old white female, single, G0P0, back in follow up on mood swings. PCP is Dr Lilyan Punt.  Review of Systems +mood swings,teary at times   Has rash under breasts and in groin, denies any itching Reviewed past medical,surgical, social and family history. Reviewed medications and allergies.     Objective:   Physical Exam BP 106/80 (BP Location: Left Arm, Patient Position: Sitting, Cuff Size: Normal)   Pulse 89   Ht 5\' 6"  (1.676 m)   Wt 126 lb (57.2 kg)   LMP 08/31/2019   BMI 20.34 kg/m  Skin warm and dry. Neck: mid line trachea, normal thyroid, good ROM, no lymphadenopathy noted. Lungs: clear to ausculation bilaterally. Cardiovascular: regular rate and rhythm. Has some pink to brown irregular discoloration under right breast and bilateral groin. PHQ 2 score is 1, has gained 3 lbs Co exam with 10/31/2019 NP student Pt aware that labs all normal.     Assessment:     1. Superficial fungus infection of skin -will rx mytrex Meds ordered this encounter  Medications  . nystatin-triamcinolone (MYCOLOG II) cream    Sig: Apply 1 application topically 2 (two) times daily.    Dispense:  30 g    Refill:  1    Order Specific Question:   Supervising Provider    Answer:   EURE, LUTHER H [[id number]]  . Norethindrone-Ethinyl Estradiol-Fe Biphas (LO LOESTRIN FE) 1 MG-10 MCG / 10 MCG tablet    Sig: Take 1 tablet by mouth daily. Take 1 daily by mouth    Dispense:  1 Package    Refill:  11    BIN Richelle Ito, PCN CN, GRP [id number]    Order Specific Question:   Supervising Provider    Answer:   [id number] H [[id number]]    2. Mood swings I feel may be PMS, will try lo Loestrin to see if helps even out moods     Plan:    -can try to keep event log Follow up in 3 months

## 2019-12-02 ENCOUNTER — Ambulatory Visit: Admitting: Adult Health

## 2020-02-04 ENCOUNTER — Ambulatory Visit: Admitting: Family Medicine

## 2020-02-04 ENCOUNTER — Encounter: Payer: Self-pay | Admitting: Family Medicine
